# Patient Record
Sex: Female | Born: 1960 | Race: White | Hispanic: No | Marital: Single | Smoking: Never smoker
Health system: Southern US, Community
[De-identification: ages and names within clinical notes are randomized; demographics above are authoritative.]

## PROBLEM LIST (undated history)

## (undated) DIAGNOSIS — Z9889 Other specified postprocedural states: Secondary | ICD-10-CM

## (undated) DIAGNOSIS — I10 Essential (primary) hypertension: Secondary | ICD-10-CM

## (undated) DIAGNOSIS — R112 Nausea with vomiting, unspecified: Secondary | ICD-10-CM

## (undated) DIAGNOSIS — N879 Dysplasia of cervix uteri, unspecified: Secondary | ICD-10-CM

## (undated) DIAGNOSIS — Z87442 Personal history of urinary calculi: Secondary | ICD-10-CM

## (undated) DIAGNOSIS — E785 Hyperlipidemia, unspecified: Secondary | ICD-10-CM

## (undated) DIAGNOSIS — N184 Chronic kidney disease, stage 4 (severe): Secondary | ICD-10-CM

## (undated) DIAGNOSIS — F32A Depression, unspecified: Secondary | ICD-10-CM

## (undated) DIAGNOSIS — J302 Other seasonal allergic rhinitis: Secondary | ICD-10-CM

## (undated) DIAGNOSIS — F419 Anxiety disorder, unspecified: Secondary | ICD-10-CM

## (undated) DIAGNOSIS — U071 COVID-19: Secondary | ICD-10-CM

## (undated) DIAGNOSIS — J1282 Pneumonia due to coronavirus disease 2019: Secondary | ICD-10-CM

## (undated) DIAGNOSIS — A419 Sepsis, unspecified organism: Secondary | ICD-10-CM

## (undated) DIAGNOSIS — K219 Gastro-esophageal reflux disease without esophagitis: Secondary | ICD-10-CM

## (undated) DIAGNOSIS — N390 Urinary tract infection, site not specified: Secondary | ICD-10-CM

## (undated) DIAGNOSIS — N200 Calculus of kidney: Secondary | ICD-10-CM

## (undated) DIAGNOSIS — Q6410 Exstrophy of urinary bladder, unspecified: Secondary | ICD-10-CM

## (undated) DIAGNOSIS — D649 Anemia, unspecified: Secondary | ICD-10-CM

## (undated) HISTORY — PX: DIALYSIS/PERMA CATHETER INSERTION: CATH118288

## (undated) HISTORY — PX: NEPHROSTOMY TUBE PLACEMENT (ARMC HX): HXRAD1726

## (undated) HISTORY — PX: KIDNEY STONE SURGERY: SHX686

## (undated) HISTORY — DX: Other seasonal allergic rhinitis: J30.2

## (undated) HISTORY — PX: COLOSTOMY: SHX63

## (undated) HISTORY — PX: SMALL INTESTINE SURGERY: SHX150

## (undated) HISTORY — DX: Hyperlipidemia, unspecified: E78.5

## (undated) HISTORY — PX: URETEROSIGMOIDOSTOMY: SUR1412

## (undated) HISTORY — PX: ABDOMINAL HYSTERECTOMY: SHX81

## (undated) HISTORY — PX: OTHER SURGICAL HISTORY: SHX169

## (undated) HISTORY — PX: ABDOMINAL HERNIA REPAIR: SHX539

---

## 2008-02-20 ENCOUNTER — Ambulatory Visit: Payer: Self-pay | Admitting: Internal Medicine

## 2008-06-07 ENCOUNTER — Ambulatory Visit: Payer: Self-pay | Admitting: Specialist

## 2008-08-04 ENCOUNTER — Emergency Department: Payer: Self-pay | Admitting: Emergency Medicine

## 2009-11-29 ENCOUNTER — Ambulatory Visit: Payer: Self-pay | Admitting: Internal Medicine

## 2010-04-18 ENCOUNTER — Ambulatory Visit: Payer: Self-pay | Admitting: Internal Medicine

## 2011-03-09 DIAGNOSIS — Q6410 Exstrophy of urinary bladder, unspecified: Secondary | ICD-10-CM | POA: Insufficient documentation

## 2011-03-09 DIAGNOSIS — N209 Urinary calculus, unspecified: Secondary | ICD-10-CM | POA: Insufficient documentation

## 2011-03-09 DIAGNOSIS — N2589 Other disorders resulting from impaired renal tubular function: Secondary | ICD-10-CM | POA: Insufficient documentation

## 2011-07-24 ENCOUNTER — Ambulatory Visit: Payer: Self-pay | Admitting: Internal Medicine

## 2012-09-05 ENCOUNTER — Ambulatory Visit: Payer: Self-pay | Admitting: Internal Medicine

## 2013-05-26 HISTORY — PX: CERVICAL CONE BIOPSY: SUR198

## 2013-06-21 DIAGNOSIS — R1900 Intra-abdominal and pelvic swelling, mass and lump, unspecified site: Secondary | ICD-10-CM | POA: Insufficient documentation

## 2013-06-21 DIAGNOSIS — N879 Dysplasia of cervix uteri, unspecified: Secondary | ICD-10-CM | POA: Insufficient documentation

## 2013-11-03 ENCOUNTER — Ambulatory Visit: Payer: Self-pay | Admitting: Internal Medicine

## 2013-11-06 ENCOUNTER — Ambulatory Visit: Payer: Self-pay | Admitting: Internal Medicine

## 2014-04-12 DIAGNOSIS — N949 Unspecified condition associated with female genital organs and menstrual cycle: Secondary | ICD-10-CM | POA: Insufficient documentation

## 2015-07-19 HISTORY — PX: COLPOSCOPY: SHX161

## 2015-12-23 HISTORY — PX: ABDOMINAL HYSTERECTOMY: SHX81

## 2016-03-08 ENCOUNTER — Other Ambulatory Visit: Payer: Self-pay | Admitting: Internal Medicine

## 2016-03-08 DIAGNOSIS — Z1231 Encounter for screening mammogram for malignant neoplasm of breast: Secondary | ICD-10-CM

## 2016-04-02 ENCOUNTER — Ambulatory Visit: Payer: Self-pay

## 2016-04-25 ENCOUNTER — Other Ambulatory Visit: Payer: Self-pay | Admitting: Urology

## 2016-04-25 DIAGNOSIS — N3000 Acute cystitis without hematuria: Secondary | ICD-10-CM

## 2016-04-25 DIAGNOSIS — N184 Chronic kidney disease, stage 4 (severe): Secondary | ICD-10-CM

## 2016-04-25 DIAGNOSIS — R82998 Other abnormal findings in urine: Secondary | ICD-10-CM

## 2016-10-30 ENCOUNTER — Ambulatory Visit
Admission: RE | Admit: 2016-10-30 | Discharge: 2016-10-30 | Disposition: A | Discharge status: Home / Self Care | Payer: Medicare Other | Source: Ambulatory Visit | Attending: Urology | Admitting: Urology

## 2016-10-30 DIAGNOSIS — N184 Chronic kidney disease, stage 4 (severe): Secondary | ICD-10-CM

## 2016-10-30 DIAGNOSIS — N281 Cyst of kidney, acquired: Secondary | ICD-10-CM | POA: Insufficient documentation

## 2016-10-30 DIAGNOSIS — R82998 Other abnormal findings in urine: Secondary | ICD-10-CM

## 2016-10-30 DIAGNOSIS — N3000 Acute cystitis without hematuria: Secondary | ICD-10-CM

## 2016-12-26 ENCOUNTER — Ambulatory Visit
Admission: RE | Admit: 2016-12-26 | Discharge: 2016-12-26 | Disposition: A | Discharge status: Home / Self Care | Payer: Medicare Other | Source: Ambulatory Visit | Attending: Internal Medicine | Admitting: Internal Medicine

## 2016-12-26 ENCOUNTER — Encounter: Payer: Self-pay | Admitting: Radiology

## 2016-12-26 DIAGNOSIS — Z1231 Encounter for screening mammogram for malignant neoplasm of breast: Secondary | ICD-10-CM | POA: Diagnosis present

## 2017-05-30 DIAGNOSIS — K432 Incisional hernia without obstruction or gangrene: Secondary | ICD-10-CM | POA: Insufficient documentation

## 2017-06-24 ENCOUNTER — Emergency Department
Admission: EM | Admit: 2017-06-24 | Discharge: 2017-06-24 | Disposition: A | Discharge status: Home / Self Care | Payer: Medicare Other | Attending: Emergency Medicine | Admitting: Emergency Medicine

## 2017-06-24 ENCOUNTER — Encounter: Payer: Self-pay | Admitting: Emergency Medicine

## 2017-06-24 ENCOUNTER — Emergency Department: Payer: Medicare Other

## 2017-06-24 ENCOUNTER — Other Ambulatory Visit: Payer: Self-pay

## 2017-06-24 DIAGNOSIS — S29012A Strain of muscle and tendon of back wall of thorax, initial encounter: Secondary | ICD-10-CM | POA: Insufficient documentation

## 2017-06-24 DIAGNOSIS — I10 Essential (primary) hypertension: Secondary | ICD-10-CM | POA: Diagnosis not present

## 2017-06-24 DIAGNOSIS — S39012A Strain of muscle, fascia and tendon of lower back, initial encounter: Secondary | ICD-10-CM

## 2017-06-24 DIAGNOSIS — Y929 Unspecified place or not applicable: Secondary | ICD-10-CM | POA: Diagnosis not present

## 2017-06-24 DIAGNOSIS — Y999 Unspecified external cause status: Secondary | ICD-10-CM | POA: Diagnosis not present

## 2017-06-24 DIAGNOSIS — S299XXA Unspecified injury of thorax, initial encounter: Secondary | ICD-10-CM | POA: Diagnosis present

## 2017-06-24 DIAGNOSIS — Y939 Activity, unspecified: Secondary | ICD-10-CM | POA: Insufficient documentation

## 2017-06-24 HISTORY — DX: Essential (primary) hypertension: I10

## 2017-06-24 MED ORDER — METHOCARBAMOL 500 MG PO TABS: 500.0000 mg | ORAL_TABLET | Freq: Four times a day (QID) | ORAL | 0 refills | Status: DC

## 2017-06-24 MED ORDER — PREDNISONE 50 MG PO TABS: 50.0000 mg | ORAL_TABLET | Freq: Every day | ORAL | 0 refills | Status: DC

## 2017-06-24 MED ORDER — PREDNISONE 20 MG PO TABS
60.0000 mg | ORAL_TABLET | Freq: Once | ORAL | Status: AC
  Administered 2017-06-24: 60 mg via ORAL
  Filled 2017-06-24: qty 3
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
## 2017-06-24 NOTE — ED Notes (Signed)
Pt discharged to home.  Family member driving.  Discharge instructions reviewed.  Verbalized understanding.  No questions or concerns at this time.  Teach back verified.  Pt in NAD.  No items left in ED.   
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
## 2017-07-12 HISTORY — PX: ABDOMINAL HERNIA REPAIR: SHX539

## 2017-07-18 ENCOUNTER — Other Ambulatory Visit: Payer: Self-pay | Admitting: Internal Medicine

## 2017-07-18 DIAGNOSIS — N183 Chronic kidney disease, stage 3 unspecified: Secondary | ICD-10-CM

## 2017-07-18 DIAGNOSIS — Z87442 Personal history of urinary calculi: Secondary | ICD-10-CM

## 2017-08-10 DIAGNOSIS — T8149XA Infection following a procedure, other surgical site, initial encounter: Secondary | ICD-10-CM | POA: Insufficient documentation

## 2017-08-21 ENCOUNTER — Other Ambulatory Visit: Payer: Medicare Other

## 2017-08-30 DIAGNOSIS — T8579XA Infection and inflammatory reaction due to other internal prosthetic devices, implants and grafts, initial encounter: Secondary | ICD-10-CM | POA: Insufficient documentation

## 2017-09-18 HISTORY — PX: DIAGNOSTIC LAPAROSCOPY: SUR761

## 2017-11-26 ENCOUNTER — Other Ambulatory Visit: Payer: Self-pay | Admitting: Internal Medicine

## 2017-11-26 DIAGNOSIS — Z1231 Encounter for screening mammogram for malignant neoplasm of breast: Secondary | ICD-10-CM

## 2017-12-31 ENCOUNTER — Ambulatory Visit
Admission: RE | Admit: 2017-12-31 | Discharge: 2017-12-31 | Disposition: A | Discharge status: Home / Self Care | Payer: Medicare Other | Source: Ambulatory Visit | Attending: Internal Medicine | Admitting: Internal Medicine

## 2017-12-31 DIAGNOSIS — N183 Chronic kidney disease, stage 3 unspecified: Secondary | ICD-10-CM

## 2017-12-31 DIAGNOSIS — M85851 Other specified disorders of bone density and structure, right thigh: Secondary | ICD-10-CM | POA: Diagnosis not present

## 2017-12-31 DIAGNOSIS — M85831 Other specified disorders of bone density and structure, right forearm: Secondary | ICD-10-CM | POA: Diagnosis not present

## 2017-12-31 DIAGNOSIS — Z1231 Encounter for screening mammogram for malignant neoplasm of breast: Secondary | ICD-10-CM | POA: Insufficient documentation

## 2017-12-31 DIAGNOSIS — Z87442 Personal history of urinary calculi: Secondary | ICD-10-CM | POA: Insufficient documentation

## 2019-02-18 ENCOUNTER — Other Ambulatory Visit: Payer: Self-pay | Admitting: Family Medicine

## 2019-02-18 ENCOUNTER — Other Ambulatory Visit: Payer: Self-pay

## 2019-02-18 ENCOUNTER — Ambulatory Visit
Admission: RE | Admit: 2019-02-18 | Discharge: 2019-02-18 | Disposition: A | Discharge status: Home / Self Care | Payer: Medicare Other | Source: Ambulatory Visit | Attending: Family Medicine | Admitting: Family Medicine

## 2019-02-18 ENCOUNTER — Ambulatory Visit
Admission: RE | Admit: 2019-02-18 | Discharge: 2019-02-18 | Disposition: A | Discharge status: Home / Self Care | Payer: Medicare Other | Attending: Family Medicine | Admitting: Family Medicine

## 2019-02-18 DIAGNOSIS — M79672 Pain in left foot: Secondary | ICD-10-CM | POA: Insufficient documentation

## 2019-02-18 DIAGNOSIS — M25562 Pain in left knee: Secondary | ICD-10-CM

## 2019-03-11 ENCOUNTER — Other Ambulatory Visit: Payer: Self-pay | Admitting: Internal Medicine

## 2019-03-11 DIAGNOSIS — Z1231 Encounter for screening mammogram for malignant neoplasm of breast: Secondary | ICD-10-CM

## 2019-04-08 ENCOUNTER — Ambulatory Visit
Admission: RE | Admit: 2019-04-08 | Discharge: 2019-04-08 | Disposition: A | Discharge status: Home / Self Care | Payer: Medicare Other | Source: Ambulatory Visit | Attending: Internal Medicine | Admitting: Internal Medicine

## 2019-04-08 DIAGNOSIS — Z1231 Encounter for screening mammogram for malignant neoplasm of breast: Secondary | ICD-10-CM | POA: Insufficient documentation

## 2019-09-18 ENCOUNTER — Other Ambulatory Visit: Payer: Self-pay

## 2019-09-18 DIAGNOSIS — B965 Pseudomonas (aeruginosa) (mallei) (pseudomallei) as the cause of diseases classified elsewhere: Secondary | ICD-10-CM | POA: Diagnosis present

## 2019-09-18 DIAGNOSIS — J1282 Pneumonia due to coronavirus disease 2019: Secondary | ICD-10-CM | POA: Diagnosis present

## 2019-09-18 DIAGNOSIS — Z87440 Personal history of urinary (tract) infections: Secondary | ICD-10-CM

## 2019-09-18 DIAGNOSIS — I129 Hypertensive chronic kidney disease with stage 1 through stage 4 chronic kidney disease, or unspecified chronic kidney disease: Secondary | ICD-10-CM | POA: Diagnosis present

## 2019-09-18 DIAGNOSIS — N390 Urinary tract infection, site not specified: Secondary | ICD-10-CM | POA: Diagnosis present

## 2019-09-18 DIAGNOSIS — Z882 Allergy status to sulfonamides status: Secondary | ICD-10-CM

## 2019-09-18 DIAGNOSIS — Z7952 Long term (current) use of systemic steroids: Secondary | ICD-10-CM

## 2019-09-18 DIAGNOSIS — E872 Acidosis: Secondary | ICD-10-CM | POA: Diagnosis present

## 2019-09-18 DIAGNOSIS — Z79899 Other long term (current) drug therapy: Secondary | ICD-10-CM

## 2019-09-18 DIAGNOSIS — B964 Proteus (mirabilis) (morganii) as the cause of diseases classified elsewhere: Secondary | ICD-10-CM | POA: Diagnosis present

## 2019-09-18 DIAGNOSIS — Z933 Colostomy status: Secondary | ICD-10-CM

## 2019-09-18 DIAGNOSIS — N184 Chronic kidney disease, stage 4 (severe): Secondary | ICD-10-CM | POA: Diagnosis present

## 2019-09-18 DIAGNOSIS — Z87442 Personal history of urinary calculi: Secondary | ICD-10-CM

## 2019-09-18 DIAGNOSIS — U071 COVID-19: Principal | ICD-10-CM | POA: Diagnosis present

## 2019-09-18 DIAGNOSIS — Z91041 Radiographic dye allergy status: Secondary | ICD-10-CM

## 2019-09-18 LAB — CBC WITH DIFFERENTIAL/PLATELET
Abs Immature Granulocytes: 0.01 10*3/uL (ref 0.00–0.07)
Basophils Absolute: 0 10*3/uL (ref 0.0–0.1)
Basophils Relative: 1 %
Eosinophils Absolute: 0 10*3/uL (ref 0.0–0.5)
Eosinophils Relative: 1 %
HCT: 34.2 % — ABNORMAL LOW (ref 36.0–46.0)
Hemoglobin: 11 g/dL — ABNORMAL LOW (ref 12.0–15.0)
Immature Granulocytes: 0 %
Lymphocytes Relative: 26 %
Lymphs Abs: 1.1 10*3/uL (ref 0.7–4.0)
MCH: 30.5 pg (ref 26.0–34.0)
MCHC: 32.2 g/dL (ref 30.0–36.0)
MCV: 94.7 fL (ref 80.0–100.0)
Monocytes Absolute: 0.2 10*3/uL (ref 0.1–1.0)
Monocytes Relative: 4 %
Neutro Abs: 2.8 10*3/uL (ref 1.7–7.7)
Neutrophils Relative %: 68 %
Platelets: 184 10*3/uL (ref 150–400)
RBC: 3.61 MIL/uL — ABNORMAL LOW (ref 3.87–5.11)
RDW: 14.4 % (ref 11.5–15.5)
WBC: 4.2 10*3/uL (ref 4.0–10.5)
nRBC: 0 % (ref 0.0–0.2)

## 2019-09-18 LAB — URINALYSIS, COMPLETE (UACMP) WITH MICROSCOPIC
Bacteria, UA: NONE SEEN
Bilirubin Urine: NEGATIVE
Glucose, UA: NEGATIVE mg/dL
Hgb urine dipstick: NEGATIVE
Ketones, ur: NEGATIVE mg/dL
Nitrite: NEGATIVE
Protein, ur: 100 mg/dL — AB
Specific Gravity, Urine: 1.011 (ref 1.005–1.030)
pH: 7 (ref 5.0–8.0)

## 2019-09-18 LAB — COMPREHENSIVE METABOLIC PANEL WITH GFR
ALT: 11 U/L (ref 0–44)
AST: 19 U/L (ref 15–41)
Albumin: 3.8 g/dL (ref 3.5–5.0)
Alkaline Phosphatase: 104 U/L (ref 38–126)
Anion gap: 8 (ref 5–15)
BUN: 25 mg/dL — ABNORMAL HIGH (ref 6–20)
CO2: 16 mmol/L — ABNORMAL LOW (ref 22–32)
Calcium: 8.5 mg/dL — ABNORMAL LOW (ref 8.9–10.3)
Chloride: 112 mmol/L — ABNORMAL HIGH (ref 98–111)
Creatinine, Ser: 2.35 mg/dL — ABNORMAL HIGH (ref 0.44–1.00)
GFR calc Af Amer: 25 mL/min — ABNORMAL LOW
GFR calc non Af Amer: 22 mL/min — ABNORMAL LOW
Glucose, Bld: 128 mg/dL — ABNORMAL HIGH (ref 70–99)
Potassium: 3.7 mmol/L (ref 3.5–5.1)
Sodium: 136 mmol/L (ref 135–145)
Total Bilirubin: 0.6 mg/dL (ref 0.3–1.2)
Total Protein: 7.3 g/dL (ref 6.5–8.1)

## 2019-09-18 LAB — LACTIC ACID, PLASMA: Lactic Acid, Venous: 0.7 mmol/L (ref 0.5–1.9)

## 2019-09-18 NOTE — ED Triage Notes (Signed)
Pt has UTI and MD was going to call in a script. Pharmacist then called back and said that she would need IV antibiotics and to come in the ER. Pt is covid positive on 09/17/19.

## 2019-09-18 NOTE — ED Notes (Signed)
Called x1

## 2019-09-18 NOTE — ED Notes (Signed)
Called x2

## 2019-09-19 ENCOUNTER — Encounter: Payer: Self-pay | Admitting: Internal Medicine

## 2019-09-19 ENCOUNTER — Emergency Department: Payer: Medicare Other

## 2019-09-19 ENCOUNTER — Inpatient Hospital Stay
Admission: EM | Admit: 2019-09-19 | Discharge: 2019-09-22 | DRG: 177 | Disposition: A | Discharge status: Home / Self Care | Payer: Medicare Other | Attending: Internal Medicine | Admitting: Internal Medicine

## 2019-09-19 DIAGNOSIS — E872 Acidosis: Secondary | ICD-10-CM | POA: Diagnosis present

## 2019-09-19 DIAGNOSIS — Z933 Colostomy status: Secondary | ICD-10-CM | POA: Diagnosis not present

## 2019-09-19 DIAGNOSIS — N390 Urinary tract infection, site not specified: Secondary | ICD-10-CM

## 2019-09-19 DIAGNOSIS — Z79899 Other long term (current) drug therapy: Secondary | ICD-10-CM | POA: Diagnosis not present

## 2019-09-19 DIAGNOSIS — I10 Essential (primary) hypertension: Secondary | ICD-10-CM | POA: Diagnosis not present

## 2019-09-19 DIAGNOSIS — B965 Pseudomonas (aeruginosa) (mallei) (pseudomallei) as the cause of diseases classified elsewhere: Secondary | ICD-10-CM | POA: Diagnosis present

## 2019-09-19 DIAGNOSIS — J1282 Pneumonia due to coronavirus disease 2019: Secondary | ICD-10-CM

## 2019-09-19 DIAGNOSIS — B964 Proteus (mirabilis) (morganii) as the cause of diseases classified elsewhere: Secondary | ICD-10-CM | POA: Diagnosis present

## 2019-09-19 DIAGNOSIS — Z91041 Radiographic dye allergy status: Secondary | ICD-10-CM | POA: Diagnosis not present

## 2019-09-19 DIAGNOSIS — U071 COVID-19: Secondary | ICD-10-CM | POA: Diagnosis not present

## 2019-09-19 DIAGNOSIS — Z7952 Long term (current) use of systemic steroids: Secondary | ICD-10-CM | POA: Diagnosis not present

## 2019-09-19 DIAGNOSIS — I129 Hypertensive chronic kidney disease with stage 1 through stage 4 chronic kidney disease, or unspecified chronic kidney disease: Secondary | ICD-10-CM | POA: Diagnosis present

## 2019-09-19 DIAGNOSIS — N184 Chronic kidney disease, stage 4 (severe): Secondary | ICD-10-CM | POA: Diagnosis not present

## 2019-09-19 DIAGNOSIS — Z87440 Personal history of urinary (tract) infections: Secondary | ICD-10-CM | POA: Diagnosis not present

## 2019-09-19 DIAGNOSIS — Z882 Allergy status to sulfonamides status: Secondary | ICD-10-CM | POA: Diagnosis not present

## 2019-09-19 DIAGNOSIS — Z87442 Personal history of urinary calculi: Secondary | ICD-10-CM | POA: Diagnosis not present

## 2019-09-19 HISTORY — DX: Chronic kidney disease, stage 4 (severe): N18.4

## 2019-09-19 HISTORY — DX: Calculus of kidney: N20.0

## 2019-09-19 HISTORY — DX: Urinary tract infection, site not specified: N39.0

## 2019-09-19 LAB — POC SARS CORONAVIRUS 2 AG: SARS Coronavirus 2 Ag: NEGATIVE

## 2019-09-19 LAB — SARS CORONAVIRUS 2 BY RT PCR (HOSPITAL ORDER, PERFORMED IN ~~LOC~~ HOSPITAL LAB): SARS Coronavirus 2: POSITIVE — AB

## 2019-09-19 MED ORDER — SODIUM CHLORIDE 0.9 % IV SOLN
100.0000 mg | Freq: Every day | INTRAVENOUS | Status: DC
  Administered 2019-09-20 – 2019-09-22 (×3): 100 mg via INTRAVENOUS
  Filled 2019-09-19 (×4): qty 20

## 2019-09-19 MED ORDER — ACETAMINOPHEN 325 MG PO TABS: 650.0000 mg | ORAL_TABLET | Freq: Four times a day (QID) | ORAL | Status: DC | PRN

## 2019-09-19 MED ORDER — HYDRALAZINE HCL 20 MG/ML IJ SOLN: 5.0000 mg | INTRAMUSCULAR | Status: DC | PRN

## 2019-09-19 MED ORDER — IPRATROPIUM BROMIDE HFA 17 MCG/ACT IN AERS
2.0000 | INHALATION_SPRAY | RESPIRATORY_TRACT | Status: DC
  Administered 2019-09-19 – 2019-09-21 (×4): 2 via RESPIRATORY_TRACT
  Filled 2019-09-19 (×2): qty 12.9

## 2019-09-19 MED ORDER — DM-GUAIFENESIN ER 30-600 MG PO TB12: 1.0000 | ORAL_TABLET | Freq: Two times a day (BID) | ORAL | Status: DC | PRN

## 2019-09-19 MED ORDER — ENOXAPARIN SODIUM 40 MG/0.4ML ~~LOC~~ SOLN: 40.0000 mg | SUBCUTANEOUS | Status: DC

## 2019-09-19 MED ORDER — ASCORBIC ACID 500 MG PO TABS
500.0000 mg | ORAL_TABLET | Freq: Every day | ORAL | Status: DC
  Administered 2019-09-19 – 2019-09-22 (×4): 500 mg via ORAL
  Filled 2019-09-19 (×4): qty 1

## 2019-09-19 MED ORDER — SODIUM BICARBONATE 650 MG PO TABS
1300.0000 mg | ORAL_TABLET | Freq: Three times a day (TID) | ORAL | Status: DC
  Administered 2019-09-19 – 2019-09-22 (×8): 1300 mg via ORAL
  Filled 2019-09-19 (×10): qty 2

## 2019-09-19 MED ORDER — ONDANSETRON HCL 4 MG/2ML IJ SOLN: 4.0000 mg | Freq: Three times a day (TID) | INTRAMUSCULAR | Status: DC | PRN

## 2019-09-19 MED ORDER — METHYLPREDNISOLONE SODIUM SUCC 40 MG IJ SOLR
30.0000 mg | Freq: Two times a day (BID) | INTRAMUSCULAR | Status: DC
  Administered 2019-09-19 – 2019-09-22 (×7): 30 mg via INTRAVENOUS
  Filled 2019-09-19 (×7): qty 1

## 2019-09-19 MED ORDER — DOCUSATE SODIUM 100 MG PO CAPS
100.0000 mg | ORAL_CAPSULE | ORAL | Status: DC | PRN
  Administered 2019-09-19 – 2019-09-20 (×2): 100 mg via ORAL
  Filled 2019-09-19 (×2): qty 1

## 2019-09-19 MED ORDER — ZINC SULFATE 220 (50 ZN) MG PO CAPS
220.0000 mg | ORAL_CAPSULE | Freq: Every day | ORAL | Status: DC
  Administered 2019-09-19 – 2019-09-22 (×4): 220 mg via ORAL
  Filled 2019-09-19 (×4): qty 1

## 2019-09-19 MED ORDER — SODIUM CHLORIDE 0.9 % IV SOLN
200.0000 mg | Freq: Once | INTRAVENOUS | Status: AC
  Administered 2019-09-19: 200 mg via INTRAVENOUS
  Filled 2019-09-19: qty 200

## 2019-09-19 MED ORDER — PIPERACILLIN-TAZOBACTAM 3.375 G IVPB
3.3750 g | Freq: Three times a day (TID) | INTRAVENOUS | Status: DC
  Administered 2019-09-19 – 2019-09-21 (×5): 3.375 g via INTRAVENOUS
  Filled 2019-09-19 (×8): qty 50

## 2019-09-19 MED ORDER — ENOXAPARIN SODIUM 30 MG/0.3ML ~~LOC~~ SOLN
30.0000 mg | SUBCUTANEOUS | Status: DC
  Administered 2019-09-19 – 2019-09-21 (×3): 30 mg via SUBCUTANEOUS
  Filled 2019-09-19 (×3): qty 0.3

## 2019-09-19 MED ORDER — CYCLOBENZAPRINE HCL 10 MG PO TABS: 10.0000 mg | ORAL_TABLET | Freq: Every evening | ORAL | Status: DC | PRN

## 2019-09-19 MED ORDER — PIPERACILLIN-TAZOBACTAM 3.375 G IVPB 30 MIN
3.3750 g | Freq: Once | INTRAVENOUS | Status: AC
Start: 2019-09-19 — End: 2019-09-19
  Administered 2019-09-19: 3.375 g via INTRAVENOUS
  Filled 2019-09-19: qty 50

## 2019-09-19 MED ORDER — LORATADINE 10 MG PO TABS
10.0000 mg | ORAL_TABLET | Freq: Every day | ORAL | Status: DC
  Administered 2019-09-20 – 2019-09-22 (×2): 10 mg via ORAL
  Filled 2019-09-19 (×3): qty 1

## 2019-09-19 MED ORDER — ALBUTEROL SULFATE HFA 108 (90 BASE) MCG/ACT IN AERS
2.0000 | INHALATION_SPRAY | RESPIRATORY_TRACT | Status: DC | PRN
  Filled 2019-09-19: qty 6.7

## 2019-09-19 NOTE — ED Notes (Signed)
This RN attempted to call report. This RN informed 1C is not taking anymore pt's due to staffing. Charge RN made aware.

## 2019-09-19 NOTE — ED Notes (Signed)
Pt given meal tray and water 

## 2019-09-19 NOTE — ED Provider Notes (Addendum)
[facility] Emergency Department Provider Note   ____________________________________________   First MD Initiated Contact with Patient 09/19/19 [id number]     (approximate)  I have reviewed the triage vital signs and the nursing notes.   HISTORY  Chief Complaint Urinary Tract Infection    HPI Jessica Patterson is a 59 y.o. female referred to the ED by her doctors at [facility] for IV antibiotics for Proteus positive UTI resistant to oral antibiotics.  Patient has CKD stage III followed by nephrology, history of kidney stones followed by urology, recurrent UTIs.  She was recently seen in the ED there and diagnosed with UTI which was cultured and found to be Proteus positive resistant to oral antibiotics.  She had several telephone conversations with her physicians as well as the pharmacy at [facility] and ultimately directed to her closest ED for evaluation and admission for IV antibiotics.  Of note, patient tested positive for COVID-19 at [facility] 2 days ago.  She has been experiencing fevers and cough and shortness of breath.  Denies chest pain, abdominal pain, nausea, vomiting or diarrhea.      Past Medical History:  Diagnosis Date  . Hypertension    Infected prosthetic mesh of abdominal wall 08/30/2017  Overview:   Formatting of this note might be different from the original. Added automatically from request for surgery [phone]   Abdominal wall abscess at site of surgical wound 08/10/2017  S/P repair of ventral hernia 07/12/2017  Incisional hernia 05/30/2017  Overview:   Formatting of this note might be different from the original. Added automatically from request for surgery [phone]   Parastomal hernia with obstruction, without gangrene 05/30/2017  Overview:   Formatting of this note might be different from the original. 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  Calcium urolithiasis 03/09/2011  Exstrophy of urinary bladder 03/09/2011  UTI (urinary tract infection)   Ureteral stone   PONV (postoperative nausea and vomiting)   Renal mass   Metabolic acidosis   Kidney stone   Hypertension   Hemorrhoids   GERD (gastroesophageal reflux disease)   CKD (chronic kidney disease) stage 4, GFR 15-29 ml/min   Bladder extrophy   Anemia in stage 4 chronic kidney disease   Allergy   Resolved Problems  Problem Noted Date Resolved Date  Normal anion gap metabolic acidosis 41/28/7867 02/15/2018  Urinary tract infection without hematuria 10/11/2015 07/04/2017  PONV (postoperative nausea and vomiting) 10/22/2014 06/25/2016  Abdominal pain of unknown etiology 09/14/2014 07/04/2017  Adnexal cyst 04/12/2014 07/04/2017  GERD (gastroesophageal reflux disease) 05/14/2013 06/25/2016  OA (osteoarthritis) 05/14/2013 06/25/2016  Abnormal Pap smear of cervix 04/24/2013 06/21/2013  Chronic urinary tract infection 03/09/2011 07/04/2017  Parastomal hernia  07/04/2017  Antibiotic-associated diarrhea       Patient Active Problem List   Diagnosis Date Noted  . Pneumonia due to COVID-19 virus 09/19/2019  . UTI (urinary tract infection) 09/19/2019    Past Surgical History:  Procedure Laterality Date  . ABDOMINAL HYSTERECTOMY      Prior to Admission medications   Medication Sig Start Date End Date Taking? Authorizing Provider  methocarbamol (ROBAXIN) 500 MG tablet Take 1 tablet (500 mg total) by mouth 4 (four) times daily. 06/24/17  Cuthriell, Charline Bills, PA-C  predniSONE (DELTASONE) 50 MG tablet Take 1 tablet (50 mg total) by mouth daily with breakfast. 06/24/17   Cuthriell, Charline Bills, PA-C    Allergies Ivp  dye [iodinated diagnostic agents] and Sulfa antibiotics  Family History  Problem Relation Age of Onset  . Breast cancer Neg Hx     Social History Social History   Tobacco Use  . Smoking status: Never Smoker  . Smokeless tobacco: Never Used  Substance Use Topics  . Alcohol use: Not on file  . Drug use: Not on file    Review of Systems  Constitutional: Positive for fever/chills Eyes: No visual changes. ENT: No sore throat. Cardiovascular: Denies chest pain. Respiratory: Positive for cough and shortness of breath. Gastrointestinal: No abdominal pain.  No nausea, no vomiting.  No diarrhea.  No constipation. Genitourinary: Negative for dysuria. Musculoskeletal: Negative for back pain. Skin: Negative for rash. Neurological: Negative for headaches, focal weakness or numbness.   ____________________________________________   PHYSICAL EXAM:  VITAL SIGNS: ED Triage Vitals  Enc Vitals Group     BP 09/18/19 1838 118/87     Pulse Rate 09/18/19 1836 (!) 101     Resp 09/18/19 1836 18     Temp 09/18/19 1836 100.1 F (37.8 C)     Temp Source 09/18/19 1836 Oral     SpO2 09/18/19 1836 95 %     Weight 09/18/19 1836 144 lb (65.3 kg)     Height 09/18/19 1836 5\' 1"  (1.549 m)     Head Circumference --      Peak Flow --      Pain Score 09/18/19 1852 5     Pain Loc --      Pain Edu? --      Excl. in [place]? --     Constitutional: Alert and oriented. Well appearing and in no acute distress. Eyes: Conjunctivae are normal. PERRL. EOMI. Head: Atraumatic. Nose: No congestion/rhinnorhea. Mouth/Throat: Mucous membranes are mildly dry.   Neck: No stridor.   Cardiovascular: Normal rate, regular rhythm. Grossly normal heart sounds. Good peripheral circulation. Respiratory: Normal respiratory effort.  No retractions. Lungs CTAB. Gastrointestinal: Soft and nontender. No distention. No abdominal bruits. No CVA tenderness. Musculoskeletal: No lower extremity tenderness nor edema.  No joint  effusions. Neurologic:  Normal speech and language. No gross focal neurologic deficits are appreciated. No gait instability. Skin:  Skin is warm, dry and intact. No rash noted. Psychiatric: Mood and affect are normal. Speech and behavior are normal.  ____________________________________________   LABS (all labs ordered are listed, but only abnormal results are displayed)  Labs Reviewed  COMPREHENSIVE METABOLIC PANEL - Abnormal; Notable for the following components:      Result Value   Chloride 112 (*)    CO2 16 (*)    Glucose, Bld 128 (*)    BUN 25 (*)    Creatinine, Ser 2.35 (*)    Calcium 8.5 (*)    GFR calc non Af Amer 22 (*)    GFR calc Af Amer 25 (*)    All other components within normal limits  CBC WITH DIFFERENTIAL/PLATELET - Abnormal; Notable for the following components:   RBC 3.61 (*)    Hemoglobin 11.0 (*)    HCT 34.2 (*)    All other components within normal limits  URINALYSIS, COMPLETE (UACMP) WITH MICROSCOPIC - Abnormal; Notable for the following components:   Color, Urine YELLOW (*)    APPearance HAZY (*)    Protein, ur 100 (*)  Leukocytes,Ua SMALL (*)    All other components within normal limits  URINE CULTURE  CULTURE, BLOOD (ROUTINE X 2)  CULTURE, BLOOD (ROUTINE X 2)  SARS CORONAVIRUS 2 BY RT PCR (HOSPITAL ORDER, [place] [facility])  LACTIC ACID, PLASMA  LACTIC ACID, PLASMA  POC URINE PREG, ED  POC SARS CORONAVIRUS 2 AG -  ED  POC SARS CORONAVIRUS 2 AG   ____________________________________________  EKG  None ____________________________________________  RADIOLOGY  ED MD interpretation: Patchy bibasilar opacities consistent with COVID-19 pneumonia  Official radiology report(s): DG Chest Port 1 View  Result Date: 09/19/2019 CLINICAL DATA:  Fever and shortness of breath, COVID-19 positivity EXAM: PORTABLE CHEST 1 VIEW COMPARISON:  None. FINDINGS: Cardiac shadow is at the upper limits of normal in size. The lungs are well  aerated bilaterally. Patchy opacities are seen in the bases consistent with the given clinical history. No sizable effusion is noted. No bony abnormality is noted. IMPRESSION: Mild patchy opacities in the bases consistent with the given clinical history. Electronically Signed   By: Inez Catalina M.D.   On: 09/19/2019 05:58    ____________________________________________   PROCEDURES  Procedure(s) performed (including Critical Care):  Procedures   ____________________________________________   INITIAL IMPRESSION / ASSESSMENT AND PLAN / ED COURSE  As part of my medical decision making, I reviewed the following data within the [place] notes reviewed and incorporated, Labs reviewed, Old chart reviewed, Radiograph reviewed, Discussed with admitting physician and Notes from prior ED visits     CLAUDEAN LEAVELLE was evaluated in Emergency Department on 09/19/2019 for the symptoms described in the history of present illness. She was evaluated in the context of the global COVID-19 pandemic, which necessitated consideration that the patient might be at risk for infection with the SARS-CoV-2 virus that causes COVID-19. Institutional protocols and algorithms that pertain to the evaluation of patients at risk for COVID-19 are in a state of rapid change based on information released by regulatory bodies including the CDC and federal and state organizations. These policies and algorithms were followed during the patient's care in the ED.    59 year old female with CKD 3 sent for IV antibiotics for Proteus positive UTI.  Laboratory results demonstrate acute on chronic AKI, leukocyte positive UTI.  I have extensively reviewed patient's records from [facility] pharmacy's help in [place] is appropriate to initiate for Proteus positive UTI.  Of note, I did also review patient's CT scan from 8/11 which already showed signs of Covid pneumonia.  Will  obtain chest x-ray, initiate IV Decadron, IV Remdesivir upon confirmatory Covid test..  Will discuss with hospitalist services for admission.   Clinical Course as of Sep 19 655  Sat Sep 19, 2019  7062 Patient has her positive [facility]  Because I am unable to upload the results into epic, obtained rapid antigen test which was NEGATIVE.  Will obtain PCR test.   [JS]    Clinical Course User Index [JS] Paulette Blanch, MD     ____________________________________________   FINAL CLINICAL IMPRESSION(S) / ED DIAGNOSES  Final diagnoses:  Urinary tract infection due to Proteus  COVID-19     ED Discharge Orders    None       Note:  This document was prepared using Dragon voice recognition software and may include unintentional dictation errors.   Paulette Blanch, MD 09/19/19 [place], [place],  MD 09/19/19 3559

## 2019-09-19 NOTE — ED Notes (Signed)
Pt given meal tray.

## 2019-09-19 NOTE — Consult Note (Signed)
Pharmacy Antibiotic Note  Jessica Patterson is a 59 y.o. female admitted on 09/19/2019 with proteus UTI resistant to po antibiotics.    Pharmacy has been consulted for Zosyn dosing.  Plan: Zosyn 3.375g IV q8h (4 hour infusion).  Height: 5\' 1"  (154.9 cm) Weight: 65.3 kg (144 lb) IBW/kg (Calculated) : 47.8  Temp (24hrs), Avg:99.6 F (37.6 C), Min:99.2 F (37.3 C), Max:100.1 F (37.8 C)  Recent Labs  Lab 09/18/19 1854 09/18/19 1925  WBC 4.2  --   CREATININE 2.35*  --   LATICACIDVEN  --  0.7    Estimated Creatinine Clearance: 22.3 mL/min (A) (by C-G formula based on SCr of 2.35 mg/dL (H)).    Allergies  Allergen Reactions   Ivp Dye [Iodinated Diagnostic Agents]    Sulfa Antibiotics     Antimicrobials this admission: Zosyn 8/14 >> remdesivir 8/14 >>   Dose adjustments this admission: none  Microbiology results: From [place] 8/11 MDR Proteus > 100k (Zosyn sensitive)  BCx 8/14 >> pending UCx 8/13 >> pending  8/14 COVID +  Thank you for allowing pharmacy to be a part of this patients care.  Lu Duffel, PharmD, BCPS Clinical Pharmacist 09/19/2019 8:05 AM

## 2019-09-19 NOTE — ED Notes (Signed)
Patient updated on wait time. Patient verbalizes understanding.  

## 2019-09-19 NOTE — ED Notes (Signed)
Attempted to call report, room not clean per Network engineer.

## 2019-09-19 NOTE — ED Notes (Signed)
Date and time results received: 09/19/19 7:25 AM  (use smartphrase ".now" to insert current time)  Test: COVID PCR Critical Value: Positive   Name of Provider Notified: Dr. Damita Dunnings  Orders Received? Or Actions Taken?: No new orders at this time

## 2019-09-19 NOTE — Consult Note (Signed)
Remdesivir - Pharmacy Brief Note    A/P:  Remdesivir 200 mg IVPB once followed by 100 mg IVPB daily x 4 days.   Lu Duffel, PharmD, BCPS Clinical Pharmacist 09/19/2019 8:03 AM

## 2019-09-19 NOTE — ED Notes (Signed)
This RN called lab to add on urine culture to previous urine sample.

## 2019-09-19 NOTE — Progress Notes (Signed)
Anticoagulation monitoring(Lovenox):  59yo  female ordered Lovenox 40 mg Q24h for DVT prevention  Filed Weights   09/18/19 1836  Weight: 65.3 kg (144 lb)   BMI 27   Lab Results  Component Value Date   CREATININE 2.35 (H) 09/18/2019   Estimated Creatinine Clearance: 22.3 mL/min (A) (by C-G formula based on SCr of 2.35 mg/dL (H)). Hemoglobin & Hematocrit     Component Value Date/Time   HGB 11.0 (L) 09/18/2019 1854   HCT 34.2 (L) 09/18/2019 1854     Per Protocol for Patient with estCrcl < 30 ml/min and BMI < 40, will transition to Lovenox 30 mg Q24h.     Paulina Fusi, PharmD, BCPS 09/19/2019 10:14 PM

## 2019-09-19 NOTE — H&P (Signed)
History and Physical    Jessica Patterson KPT:[id number] DOB: 08/22/1960 DOA: 09/19/2019  Referring MD/NP/PA:   PCP: Ellamae Sia, MD   Patient coming from:  The patient is coming from home.  At baseline, pt is independent for most of ADL.        Chief Complaint: SOB and burning on urination  HPI: Jessica Patterson is a 59 y.o. female with medical history significant of hypertension, CKD stage IV, kidney stone, recurrent UTI, s/p of colostomy, who presents with shortness breath, burning on urination.  Patient states that she has been sick for several days, including cough, shortness of breath, generalized weakness.  She was tested positive for Covid 19 on 09/17/2019.  Denies chest pain.  Currently no fever or chills.  No nausea, vomiting, diarrhea or abdominal pain. Patient also reports burning on urination.  She was seen in [facility] and was diagnosed with recent UTI.  She was called back due to positive urine culture for Proteus, which is resistant to oral antibiotics.  ED Course: pt was found to have positive Covid PCR, WBC 4.2, urinalysis (hazy appearance, small amount of leukocyte, no bacteria, WBC 21-50), slightly worsening renal function, temperature 99.2, blood pressure 120/76, tachycardia, RR 17, oxygen saturation 96% on room air.  Chest x-ray showed patchy infiltration in bilateral base.  Patient had a CT abdomen 8/11 which already showed patchy infiltration in the lower zones of bilateral lung.  Patient is admitted to [facility] bed as inpatient.  CT-abd/pelvis on 09/16/19 showed: 1. As compared to prior CT comparison, there has been interval development of patchy groundglass opacities throughout the visualized lower lung zones. Findings are concerning for atypical infection including viral causes.  2. Similar appearance of the kidneys with substantial bilateral renal cortical scarring and a 10 mm calculus in the left upper pole kidney. No ureteric obstruction is otherwise evident.  3.  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  4. Interval removal of left ventral abdominal wall mesh. Multiple small bowel loops are noted within a infraumbilical midline ventral abdominal wall defect, without definite signs of bowel obstruction. Advise correlation with reducibility.  5. Additional unchanged findings as noted above.   Review of Systems:   General: no fevers, chills, no body weight gain, has poor appetite, has fatigue HEENT: no blurry vision, hearing changes or sore throat Respiratory: has dyspnea, coughing, no wheezing CV: no chest pain, no palpitations GI: no nausea, vomiting, abdominal pain, diarrhea, constipation GU: no dysuria, has burning on urination, no increased urinary frequency, hematuria  Ext: no leg edema Neuro: no unilateral weakness, numbness, or tingling, no vision change or hearing loss Skin: no rash, no skin tear. MSK: No muscle spasm, no deformity, no limitation of range of movement in spin Heme: No easy bruising.  Travel history: No recent long distant travel.  Allergy:  Allergies  Allergen Reactions  . Ivp Dye [Iodinated Diagnostic Agents]   . Sulfa Antibiotics     Past Medical History:  Diagnosis Date  . CKD (chronic kidney disease), stage IV ([place])   . Hypertension   . Kidney stone   . Recurrent UTI     Past Surgical History:  Procedure Laterality Date  . ABDOMINAL HYSTERECTOMY      Social History:  reports that she has never smoked. She has never used smokeless tobacco. No history on file for alcohol use and drug use.  Family History:  Family History  Problem Relation Age of Onset  .  Lung cancer Mother   . Stroke Father   . Breast cancer Neg Hx      Prior to Admission medications   Medication Sig Start Date End Date Taking? Authorizing Provider  methocarbamol (ROBAXIN) 500 MG tablet Take 1 tablet (500 mg total) by mouth 4 (four) times daily. 06/24/17   Cuthriell, Charline Bills, PA-C   predniSONE (DELTASONE) 50 MG tablet Take 1 tablet (50 mg total) by mouth daily with breakfast. 06/24/17   Darletta Moll, PA-C    Physical Exam: Vitals:   09/19/19 0856 09/19/19 0900 09/19/19 0930 09/19/19 1100  BP: 116/77 122/79 123/84 114/77  Pulse: 78 76 73 80  Resp: 16 16 12 17   Temp:      TempSrc:      SpO2: 97% 94% 97% 96%  Weight:      Height:       General: Not in acute distress HEENT:       Eyes: PERRL, EOMI, no scleral icterus.       ENT: No discharge from the ears and nose, no pharynx injection, no tonsillar enlargement.        Neck: No JVD, no bruit, no mass felt. Heme: No neck lymph node enlargement. Cardiac: S1/S2, RRR, No murmurs, No gallops or rubs. Respiratory: No rales, wheezing, rhonchi or rubs. GI: Soft, nondistended, nontender, no rebound pain, no organomegaly, BS present. GU: No hematuria Ext: No pitting leg edema bilaterally. 2+DP/PT pulse bilaterally. Musculoskeletal: No joint deformities, No joint redness or warmth, no limitation of ROM in spin. Skin: No rashes.  Neuro: Alert, oriented X3, cranial nerves II-XII grossly intact, moves all extremities normally.  Psych: Patient is not psychotic, no suicidal or hemocidal ideation.  Labs on Admission: I have personally reviewed following labs and imaging studies  CBC: Recent Labs  Lab 09/18/19 1854  WBC 4.2  NEUTROABS 2.8  HGB 11.0*  HCT 34.2*  MCV 94.7  PLT 283   Basic Metabolic Panel: Recent Labs  Lab 09/18/19 1854  NA 136  K 3.7  CL 112*  CO2 16*  GLUCOSE 128*  BUN 25*  CREATININE 2.35*  CALCIUM 8.5*   GFR: Estimated Creatinine Clearance: 22.3 mL/min (A) (by C-G formula based on SCr of 2.35 mg/dL (H)). Liver Function Tests: Recent Labs  Lab 09/18/19 1854  AST 19  ALT 11  ALKPHOS 104  BILITOT 0.6  PROT 7.3  ALBUMIN 3.8   No results for input(s): LIPASE, AMYLASE in the last 168 hours. No results for input(s): AMMONIA in the last 168 hours. Coagulation Profile: No  results for input(s): INR, PROTIME in the last 168 hours. Cardiac Enzymes: No results for input(s): CKTOTAL, CKMB, CKMBINDEX, TROPONINI in the last 168 hours. BNP (last 3 results) No results for input(s): PROBNP in the last 8760 hours. HbA1C: No results for input(s): HGBA1C in the last 72 hours. CBG: No results for input(s): GLUCAP in the last 168 hours. Lipid Profile: No results for input(s): CHOL, HDL, LDLCALC, TRIG, CHOLHDL, LDLDIRECT in the last 72 hours. Thyroid Function Tests: No results for input(s): TSH, T4TOTAL, FREET4, T3FREE, THYROIDAB in the last 72 hours. Anemia Panel: No results for input(s): VITAMINB12, FOLATE, FERRITIN, TIBC, IRON, RETICCTPCT in the last 72 hours. Urine analysis:    Component Value Date/Time   COLORURINE YELLOW (A) 09/18/2019 1854   APPEARANCEUR HAZY (A) 09/18/2019 1854   LABSPEC 1.011 09/18/2019 1854   PHURINE 7.0 09/18/2019 [place] 09/18/2019 1854   HGBUR NEGATIVE 09/18/2019 1854   BILIRUBINUR  NEGATIVE 09/18/2019 [place] 09/18/2019 1854   PROTEINUR 100 (A) 09/18/2019 1854   NITRITE NEGATIVE 09/18/2019 1854   LEUKOCYTESUR SMALL (A) 09/18/2019 1854   Sepsis Labs: @LABRCNTIP (procalcitonin:4,lacticidven:4) ) Recent Results (from the past 240 hour(s))  Culture, blood (routine x 2)     Status: None (Preliminary result)   Collection Time: 09/19/19  5:14 AM   Specimen: BLOOD  Result Value Ref Range Status   Specimen Description BLOOD RIGHT ANTECUBITAL  Final   Special Requests   Final    BOTTLES DRAWN AEROBIC AND ANAEROBIC Blood Culture adequate volume   Culture   Final    NO GROWTH < 12 HOURS Performed at [facility], [address], [place], [place] [address]    Report Status PENDING  Incomplete  Culture, blood (routine x 2)     Status: None (Preliminary result)   Collection Time: 09/19/19  5:14 AM   Specimen: BLOOD  Result Value Ref Range Status   Specimen Description BLOOD LEFT ANTECUBITAL   Final   Special Requests   Final    BOTTLES DRAWN AEROBIC AND ANAEROBIC Blood Culture results may not be optimal due to an excessive volume of blood received in culture bottles   Culture   Final    NO GROWTH < 12 HOURS Performed at [facility], [address] [place], [place] [address]    Report Status PENDING  Incomplete  SARS Coronavirus 2 by RT PCR (hospital order, performed in [place] hospital lab) Nasopharyngeal Nasopharyngeal Swab     Status: Abnormal   Collection Time: 09/19/19  5:15 AM   Specimen: Nasopharyngeal Swab  Result Value Ref Range Status   SARS Coronavirus 2 POSITIVE (A) NEGATIVE Final    Comment: RESULT CALLED TO, READ BACK BY AND VERIFIED WITH: Georgann Housekeeper AT 2353 ON 09/19/2019 BY Epifanio Lesches S Performed at [facility], [address] [place], [place] [address]      Radiological Exams on Admission: DG Chest Port 1 View  Result Date: 09/19/2019 CLINICAL DATA:  Fever and shortness of breath, COVID-19 positivity EXAM: PORTABLE CHEST 1 VIEW COMPARISON:  None. FINDINGS: Cardiac shadow is at the upper limits of normal in size. The lungs are well aerated bilaterally. Patchy opacities are seen in the bases consistent with the given clinical history. No sizable effusion is noted. No bony abnormality is noted. IMPRESSION: Mild patchy opacities in the bases consistent with the given clinical history. Electronically Signed   By: Inez Catalina M.D.   On: 09/19/2019 05:58     EKG:  Not done in ED, will get one.   Assessment/Plan Principal Problem:   Pneumonia due to COVID-19 virus Active Problems:   Recurrent UTI   Hypertension   CKD (chronic kidney disease), stage IV ([facility])   Pneumonia due to COVID-19 virus: Patient does not have oxygen desaturation, but has positive infiltration on chest x-ray.  -will admit to med-surg bed as inpt -Remdesivir per pharm -Solumedrol 30 mg bid -vitamin C, zinc.  -Bronchodilators -PRN Mucinex for cough -f/u  Blood culture -D-dimer, BNP,Trop, LFT, CRP, LDH, Procalcitonin, Ferritin, fibinogen, TG, Hep B SAg, HIV ab -Daily CRP, Ferritin, D-dimer, -if pt develops oxygen desaturation, will ask the patient to maintain an awake prone position for 16+ hours a day, if possible, with a minimum of 2-3 hours at a time -Will attempt to maintain euvolemia to a net negative fluid status -IF patient deteriorates, will consult PCCM and ID  Recurrent UTI due to Proteus  infection -Zosyn -Follow-up blood culture and urine culture  Hypertension -IV hydralazine as needed -Hold lisinopril since renal function is slightly worsening  CKD (chronic kidney disease), stage IV ([place]): Slightly worsening.  Recent baseline creatinine 1.9-2.2.  Her creatinine is 2.35, BUN 25. -Hold lisinopril -Follow-up by BMP      DVT ppx:   SQ Lovenox Code Status: Full code Family Communication: not done, no family member is at bed side.   Disposition Plan:  Anticipate discharge back to previous environment Consults called:  none Admission status: Med-surg bed as inpt       Status is: Inpatient  Remains inpatient appropriate because:Inpatient level of care appropriate due to severity of illness.  Patient is on multiple computers, now presents with pneumonia due to COVID-19 infection, UTI due to Proteus.  Her presentation is highly complicated.  Patient is at high risk of deteriorating given multiple comorbidities.  Patient will need to be treated in hospital for at least 2 days.   Dispo: The patient is from: Home              Anticipated d/c is to: Home              Anticipated d/c date is: 2 days              Patient currently is not medically stable to d/c.          Date of Service 09/19/2019    Ivor Costa [facility]   If 7PM-7AM, please contact night-coverage [email or web address] 09/19/2019, 1:22 PM

## 2019-09-20 LAB — COMPREHENSIVE METABOLIC PANEL
ALT: 11 U/L (ref 0–44)
AST: 16 U/L (ref 15–41)
Albumin: 3.4 g/dL — ABNORMAL LOW (ref 3.5–5.0)
Alkaline Phosphatase: 96 U/L (ref 38–126)
Anion gap: 12 (ref 5–15)
BUN: 39 mg/dL — ABNORMAL HIGH (ref 6–20)
CO2: 17 mmol/L — ABNORMAL LOW (ref 22–32)
Calcium: 8.9 mg/dL (ref 8.9–10.3)
Chloride: 110 mmol/L (ref 98–111)
Creatinine, Ser: 2.36 mg/dL — ABNORMAL HIGH (ref 0.44–1.00)
GFR calc Af Amer: 25 mL/min — ABNORMAL LOW (ref >60)
GFR calc non Af Amer: 22 mL/min — ABNORMAL LOW (ref >60)
Glucose, Bld: 133 mg/dL — ABNORMAL HIGH (ref 70–99)
Potassium: 4.2 mmol/L (ref 3.5–5.1)
Sodium: 139 mmol/L (ref 135–145)
Total Bilirubin: 0.5 mg/dL (ref 0.3–1.2)
Total Protein: 7 g/dL (ref 6.5–8.1)

## 2019-09-20 LAB — CBC WITH DIFFERENTIAL/PLATELET
Abs Immature Granulocytes: 0.02 10*3/uL (ref 0.00–0.07)
Basophils Absolute: 0 10*3/uL (ref 0.0–0.1)
Basophils Relative: 0 %
Eosinophils Absolute: 0 10*3/uL (ref 0.0–0.5)
Eosinophils Relative: 0 %
HCT: 33.7 % — ABNORMAL LOW (ref 36.0–46.0)
Hemoglobin: 10.7 g/dL — ABNORMAL LOW (ref 12.0–15.0)
Immature Granulocytes: 1 %
Lymphocytes Relative: 23 %
Lymphs Abs: 0.9 10*3/uL (ref 0.7–4.0)
MCH: 30.2 pg (ref 26.0–34.0)
MCHC: 31.8 g/dL (ref 30.0–36.0)
MCV: 95.2 fL (ref 80.0–100.0)
Monocytes Absolute: 0.2 10*3/uL (ref 0.1–1.0)
Monocytes Relative: 5 %
Neutro Abs: 2.7 10*3/uL (ref 1.7–7.7)
Neutrophils Relative %: 71 %
Platelets: 184 10*3/uL (ref 150–400)
RBC: 3.54 MIL/uL — ABNORMAL LOW (ref 3.87–5.11)
RDW: 14.4 % (ref 11.5–15.5)
Smear Review: NORMAL
WBC: 3.7 10*3/uL — ABNORMAL LOW (ref 4.0–10.5)
nRBC: 0 % (ref 0.0–0.2)

## 2019-09-20 LAB — BRAIN NATRIURETIC PEPTIDE: B Natriuretic Peptide: 61.9 pg/mL (ref 0.0–100.0)

## 2019-09-20 LAB — TROPONIN I (HIGH SENSITIVITY): Troponin I (High Sensitivity): <2 ng/L (ref <18)

## 2019-09-20 LAB — C-REACTIVE PROTEIN
CRP: 7.4 mg/dL — ABNORMAL HIGH (ref <1.0)
CRP: 6.3 mg/dL — ABNORMAL HIGH (ref <1.0)

## 2019-09-20 LAB — HIV ANTIBODY (ROUTINE TESTING W REFLEX): HIV Screen 4th Generation wRfx: NONREACTIVE

## 2019-09-20 LAB — LACTATE DEHYDROGENASE: LDH: 180 U/L (ref 98–192)

## 2019-09-20 LAB — FERRITIN
Ferritin: 149 ng/mL (ref 11–307)
Ferritin: 151 ng/mL (ref 11–307)

## 2019-09-20 LAB — FIBRIN DERIVATIVES D-DIMER (ARMC ONLY)
Fibrin derivatives D-dimer (ARMC): 1493.2 ng/mL (FEU) — ABNORMAL HIGH (ref 0.00–499.00)
Fibrin derivatives D-dimer (ARMC): 1231.65 ng/mL (FEU) — ABNORMAL HIGH (ref 0.00–499.00)

## 2019-09-20 LAB — FIBRINOGEN: Fibrinogen: 484 mg/dL — ABNORMAL HIGH (ref 210–475)

## 2019-09-20 LAB — TRIGLYCERIDES: Triglycerides: 72 mg/dL (ref <150)

## 2019-09-20 LAB — PROCALCITONIN: Procalcitonin: <0.1 ng/mL

## 2019-09-20 LAB — HEPATITIS B SURFACE ANTIGEN: Hepatitis B Surface Ag: NONREACTIVE

## 2019-09-20 NOTE — Progress Notes (Addendum)
PROGRESS NOTE    Jessica Patterson  OIZ:[id number] DOB: Apr 11, 1960 DOA: 09/19/2019 PCP: Ellamae Sia, MD   Brief Narrative: 59 year old with past medical history significant for hypertension, CKD stage IV, kidney stone, recurrent UTIs status post colostomy who presents complaining of worsening shortness of breath and dysuria.  Patient reported dyspnea, cough, generalized weakness.  She was tested positive for COVID-19 on 09/17/2019.  She was also recently diagnosed with UTI.  Urine grew with Proteus, which was resistant to oral antibiotics she was advised to presented to the ED.    Evaluation in the ED: COVID-19 positive, UA 21-50 white blood cell, chest x-ray: Showed patchy infiltrate bilaterally.  -CT abdomen, pelvis; interval development of patchy groundglass opacity throughout the visualized lower lung zones.  Findings consistent with atypical infection.  Bilateral renal cortical scarring 10 mm calculus in the left upper pole kidney.  No ureteric obstruction evident.  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  Interval removal of left ventral abdominal wall mesh. Multiple small bowel loops are noted within a infraumbilical midline ventral abdominal wall defect, without definite signs of bowel obstruction. Advise correlation with reducibility.     Assessment & Plan:   Principal Problem:   Pneumonia due to COVID-19 virus Active Problems:   Recurrent UTI   Hypertension   CKD (chronic kidney disease), stage IV ([place])  1-Pneumonia due to COVID-19 virus: Patient presents Dyspnea, chest x ray positive for PNA>  Continue with Albuterol and Atrovent Continue with  Remdesivir dexamethasone, day 2.  Continue with vitamin C and zinc Leukopenia secondary to viral illness  2-Recurrent UTI due to Proteus infection: Per report urine grew proteous, no sensitive to oral antibiotics.  Continue with Zosyn.  Follow urine culture ; gram negative rods.   3-Hypertension; PRN  hydralazine.   4-CKD stage IV: Metabolic acidosis Recent baseline 1.9--2.2 Cr stable.  Continue with sodium bicarb tablet  Estimated body mass index is 27.21 kg/m as calculated from the following:   Height as of this encounter: 5\' 1"  (1.549 m).   Weight as of this encounter: 65.3 kg.   DVT prophylaxis: Lovenox Code Status: Full code Family Communication: Care discussed with patient Disposition Plan:  Status is: Inpatient  Remains inpatient appropriate because:Unsafe d/c plan and IV treatments appropriate due to intensity of illness or inability to take PO   Dispo: The patient is from: Home              Anticipated d/c is to: Home              Anticipated d/c date is: 2 days              Patient currently is not medically stable to d/c.        Consultants:   None  Procedures:   None  Antimicrobials:  Zosyn  Subjective: Patient is alert and conversant.  She denies shortness of breath.  She denies abdominal pain.  Objective: Vitals:   09/19/19 1858 09/19/19 2045 09/20/19 0145 09/20/19 0431  BP:  (!) 142/87 124/78 118/79  Pulse: 72 75 64 63  Resp: 20 16 16 16   Temp:  98.4 F (36.9 C) 98 F (36.7 C) 98 F (36.7 C)  TempSrc:  Oral Oral Oral  SpO2: 95% 96% 92% 93%  Weight:      Height:        Intake/Output Summary (Last 24 hours) at 09/20/2019 0723 Last data filed at 09/20/2019 0600 Gross per 24 hour  Intake  107.86 ml  Output --  Net 107.86 ml   Filed Weights   09/18/19 1836  Weight: 65.3 kg    Examination:  General exam: Appears calm and comfortable  Respiratory system: Bilateral crackles ,respiratory effort normal. Cardiovascular system: S1 & S2 heard, RRR. No JVD, murmurs, rubs, gallops or clicks. No pedal edema. Gastrointestinal system: Abdomen is nondistended, soft and nontender. No organomegaly or masses felt. Normal bowel sounds heard. Central nervous system: Alert and oriented.  Following commands Extremities: Symmetric 5 x 5  power.   Data Reviewed: I have personally reviewed following labs and imaging studies  CBC: Recent Labs  Lab 09/18/19 1854 09/20/19 0643  WBC 4.2 3.7*  NEUTROABS 2.8 PENDING  HGB 11.0* 10.7*  HCT 34.2* 33.7*  MCV 94.7 95.2  PLT 184 850   Basic Metabolic Panel: Recent Labs  Lab 09/18/19 1854  NA 136  K 3.7  CL 112*  CO2 16*  GLUCOSE 128*  BUN 25*  CREATININE 2.35*  CALCIUM 8.5*   GFR: Estimated Creatinine Clearance: 22.3 mL/min (A) (by C-G formula based on SCr of 2.35 mg/dL (H)). Liver Function Tests: Recent Labs  Lab 09/18/19 1854  AST 19  ALT 11  ALKPHOS 104  BILITOT 0.6  PROT 7.3  ALBUMIN 3.8   No results for input(s): LIPASE, AMYLASE in the last 168 hours. No results for input(s): AMMONIA in the last 168 hours. Coagulation Profile: No results for input(s): INR, PROTIME in the last 168 hours. Cardiac Enzymes: No results for input(s): CKTOTAL, CKMB, CKMBINDEX, TROPONINI in the last 168 hours. BNP (last 3 results) No results for input(s): PROBNP in the last 8760 hours. HbA1C: No results for input(s): HGBA1C in the last 72 hours. CBG: No results for input(s): GLUCAP in the last 168 hours. Lipid Profile: Recent Labs    09/19/19 2331  TRIG 72   Thyroid Function Tests: No results for input(s): TSH, T4TOTAL, FREET4, T3FREE, THYROIDAB in the last 72 hours. Anemia Panel: Recent Labs    09/19/19 2331  FERRITIN 149   Sepsis Labs: Recent Labs  Lab 09/18/19 1925 09/19/19 2331  PROCALCITON  --  <0.10  LATICACIDVEN 0.7  --     Recent Results (from the past 240 hour(s))  Culture, blood (routine x 2)     Status: None (Preliminary result)   Collection Time: 09/19/19  5:14 AM   Specimen: BLOOD  Result Value Ref Range Status   Specimen Description BLOOD RIGHT ANTECUBITAL  Final   Special Requests   Final    BOTTLES DRAWN AEROBIC AND ANAEROBIC Blood Culture adequate volume   Culture   Final    NO GROWTH 1 DAY Performed at [facility],  [address], [place], [place] [address]    Report Status PENDING  Incomplete  Culture, blood (routine x 2)     Status: None (Preliminary result)   Collection Time: 09/19/19  5:14 AM   Specimen: BLOOD  Result Value Ref Range Status   Specimen Description BLOOD LEFT ANTECUBITAL  Final   Special Requests   Final    BOTTLES DRAWN AEROBIC AND ANAEROBIC Blood Culture results may not be optimal due to an excessive volume of blood received in culture bottles   Culture   Final    NO GROWTH 1 DAY Performed at [facility], [address] [place], [place] [address]    Report Status PENDING  Incomplete  SARS Coronavirus 2 by RT PCR (hospital order, performed in [facility] hospital lab) Nasopharyngeal Nasopharyngeal  Swab     Status: Abnormal   Collection Time: 09/19/19  5:15 AM   Specimen: Nasopharyngeal Swab  Result Value Ref Range Status   SARS Coronavirus 2 POSITIVE (A) NEGATIVE Final    Comment: RESULT CALLED TO, READ BACK BY AND VERIFIED WITH: Georgann Housekeeper AT 2952 ON 09/19/2019 BY Epifanio Lesches S Performed at [facility], [address], [place], [place] [address]          Radiology Studies: DG Chest Port 1 View  Result Date: 09/19/2019 CLINICAL DATA:  Fever and shortness of breath, COVID-19 positivity EXAM: PORTABLE CHEST 1 VIEW COMPARISON:  None. FINDINGS: Cardiac shadow is at the upper limits of normal in size. The lungs are well aerated bilaterally. Patchy opacities are seen in the bases consistent with the given clinical history. No sizable effusion is noted. No bony abnormality is noted. IMPRESSION: Mild patchy opacities in the bases consistent with the given clinical history. Electronically Signed   By: Inez Catalina M.D.   On: 09/19/2019 05:58        Scheduled Meds: . vitamin C  500 mg Oral Daily  . enoxaparin (LOVENOX) injection  30 mg Subcutaneous Q24H  . ipratropium  2 puff Inhalation Q4H  . loratadine  10 mg Oral Daily  . methylPREDNISolone  (SOLU-MEDROL) injection  30 mg Intravenous Q12H  . sodium bicarbonate  1,300 mg Oral TID  . zinc sulfate  220 mg Oral Daily   Continuous Infusions: . piperacillin-tazobactam (ZOSYN)  IV 12.5 mL/hr at 09/20/19 0600  . remdesivir 100 mg in NS 100 mL       LOS: 1 day    Time spent: 35 minutes.     Elmarie Shiley, MD [facility]   If 7PM-7AM, please contact night-coverage [email or web address]  09/20/2019, 7:23 AM

## 2019-09-21 LAB — CBC WITH DIFFERENTIAL/PLATELET
Abs Immature Granulocytes: 0.07 10*3/uL (ref 0.00–0.07)
Basophils Absolute: 0 10*3/uL (ref 0.0–0.1)
Basophils Relative: 0 %
Eosinophils Absolute: 0 10*3/uL (ref 0.0–0.5)
Eosinophils Relative: 0 %
HCT: 34.1 % — ABNORMAL LOW (ref 36.0–46.0)
Hemoglobin: 11 g/dL — ABNORMAL LOW (ref 12.0–15.0)
Immature Granulocytes: 1 %
Lymphocytes Relative: 12 %
Lymphs Abs: 1 10*3/uL (ref 0.7–4.0)
MCH: 30.7 pg (ref 26.0–34.0)
MCHC: 32.3 g/dL (ref 30.0–36.0)
MCV: 95.3 fL (ref 80.0–100.0)
Monocytes Absolute: 0.2 10*3/uL (ref 0.1–1.0)
Monocytes Relative: 3 %
Neutro Abs: 7.2 10*3/uL (ref 1.7–7.7)
Neutrophils Relative %: 84 %
Platelets: 223 10*3/uL (ref 150–400)
RBC: 3.58 MIL/uL — ABNORMAL LOW (ref 3.87–5.11)
RDW: 14.4 % (ref 11.5–15.5)
WBC: 8.5 10*3/uL (ref 4.0–10.5)
nRBC: 0 % (ref 0.0–0.2)

## 2019-09-21 LAB — COMPREHENSIVE METABOLIC PANEL
ALT: 11 U/L (ref 0–44)
AST: 15 U/L (ref 15–41)
Albumin: 3.4 g/dL — ABNORMAL LOW (ref 3.5–5.0)
Alkaline Phosphatase: 79 U/L (ref 38–126)
Anion gap: 11 (ref 5–15)
BUN: 45 mg/dL — ABNORMAL HIGH (ref 6–20)
CO2: 18 mmol/L — ABNORMAL LOW (ref 22–32)
Calcium: 8.8 mg/dL — ABNORMAL LOW (ref 8.9–10.3)
Chloride: 111 mmol/L (ref 98–111)
Creatinine, Ser: 2.22 mg/dL — ABNORMAL HIGH (ref 0.44–1.00)
GFR calc Af Amer: 27 mL/min — ABNORMAL LOW (ref >60)
GFR calc non Af Amer: 23 mL/min — ABNORMAL LOW (ref >60)
Glucose, Bld: 123 mg/dL — ABNORMAL HIGH (ref 70–99)
Potassium: 4 mmol/L (ref 3.5–5.1)
Sodium: 140 mmol/L (ref 135–145)
Total Bilirubin: 0.6 mg/dL (ref 0.3–1.2)
Total Protein: 6.8 g/dL (ref 6.5–8.1)

## 2019-09-21 LAB — URINE CULTURE: Culture: 80000 — AB

## 2019-09-21 LAB — FERRITIN: Ferritin: 171 ng/mL (ref 11–307)

## 2019-09-21 LAB — FIBRIN DERIVATIVES D-DIMER (ARMC ONLY): Fibrin derivatives D-dimer (ARMC): 909.01 ng/mL (FEU) — ABNORMAL HIGH (ref 0.00–499.00)

## 2019-09-21 LAB — LACTIC ACID, PLASMA: Lactic Acid, Venous: 0.7 mmol/L (ref 0.5–1.9)

## 2019-09-21 LAB — C-REACTIVE PROTEIN: CRP: 3.2 mg/dL — ABNORMAL HIGH (ref <1.0)

## 2019-09-21 MED ORDER — SODIUM CHLORIDE 0.9 % IV SOLN
2.0000 g | INTRAVENOUS | Status: DC
  Administered 2019-09-21 – 2019-09-22 (×2): 2 g via INTRAVENOUS
  Filled 2019-09-21 (×3): qty 2

## 2019-09-21 MED ORDER — SODIUM CHLORIDE 0.9 % IV SOLN
INTRAVENOUS | Status: DC | PRN
  Administered 2019-09-21: 01:00:00 250 mL via INTRAVENOUS

## 2019-09-21 NOTE — Progress Notes (Signed)
PROGRESS NOTE    Jessica Patterson  MWU:[id number] DOB: 1960-02-14 DOA: 09/19/2019 PCP: Ellamae Sia, MD   Brief Narrative: 59 year old with past medical history significant for hypertension, CKD stage IV, kidney stone, recurrent UTIs status post colostomy who presents complaining of worsening shortness of breath and dysuria.  Patient reported dyspnea, cough, generalized weakness.  She was tested positive for COVID-19 on 09/17/2019.  She was also recently diagnosed with UTI.  Urine grew with Proteus, which was resistant to oral antibiotics she was advised to presented to the ED.    Evaluation in the ED: COVID-19 positive, UA 21-50 white blood cell, chest x-ray: Showed patchy infiltrate bilaterally.  -CT abdomen, pelvis; interval development of patchy groundglass opacity throughout the visualized lower lung zones.  Findings consistent with atypical infection.  Bilateral renal cortical scarring 10 mm calculus in the left upper pole kidney.  No ureteric obstruction evident.  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  Interval removal of left ventral abdominal wall mesh. Multiple small bowel loops are noted within a infraumbilical midline ventral abdominal wall defect, without definite signs of bowel obstruction. Advise correlation with reducibility.     Assessment & Plan:   Principal Problem:   Pneumonia due to COVID-19 virus Active Problems:   Recurrent UTI   Hypertension   CKD (chronic kidney disease), stage IV ([place])  1-Pneumonia due to COVID-19 virus: Patient presents Dyspnea, chest x ray positive for PNA>  Continue with Albuterol and Atrovent Continue with  Remdesivir dexamethasone, day 3.  Continue with vitamin C and zinc Leukopenia secondary to viral illness  2-Recurrent UTI due to Proteus infection, Pseudomonas UTI Per report urine grew proteous, no sensitive to oral antibiotics. Sensitive to Zosyn.  Received 2 days of Zosyn. Antibiotics change to  cefazolin.  Follow urine culture; grew Pseudomonas.  Plan to treat with cefazolin for another day.   3-Hypertension; PRN hydralazine.   4-CKD stage IV: Metabolic acidosis Recent baseline 1.9--2.2 Cr stable.  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 -folow up with primary urology.   Estimated body mass index is 27.21 kg/m as calculated from the following:   Height as of this encounter: 5\' 1"  (1.549 m).   Weight as of this encounter: 65.3 kg.   DVT prophylaxis: Lovenox Code Status: Full code Family Communication: Care discussed with patient Disposition Plan:  Status is: Inpatient  Remains inpatient appropriate because:Unsafe d/c plan and IV treatments appropriate due to intensity of illness or inability to take PO   Dispo: The patient is from: Home              Anticipated d/c is to: Home              Anticipated d/c date is: 2 days              Patient currently is not medically stable to d/c. Plan to discharge 8/17 after completion of antibiotics. No good oral option for pseudomonas.         Consultants:   None  Procedures:   None  Antimicrobials:  Zosyn  Subjective: She is feeling well. She wants to go home, but understand she needs IV antibiotics.   Objective: Vitals:   09/21/19 0013 09/21/19 0555 09/21/19 0752 09/21/19 1226  BP: (!) 150/87 136/83 140/80 (!) 154/76  Pulse: 63 (!) 58 60 60  Resp:  16 16 12 14   Temp: 98.3 F (36.8 C) 98.4 F (36.9 C) 98.4 F (36.9 C) 97.8 F (36.6 C)  TempSrc: Oral Oral Oral Oral  SpO2: 94% 92% 94% 96%  Weight:      Height:        Intake/Output Summary (Last 24 hours) at 09/21/2019 1331 Last data filed at 09/21/2019 0449 Gross per 24 hour  Intake 248.18 ml  Output --  Net 248.18 ml   Filed Weights   09/18/19 1836  Weight:  65.3 kg    Examination:  General exam: NAD Respiratory system: Fine crackles.  Cardiovascular system: S 1, S 2 RRR Gastrointestinal system:  BS present, soft, nt Central nervous system: Alert, follows command Extremities: no edema   Data Reviewed: I have personally reviewed following labs and imaging studies  CBC: Recent Labs  Lab 09/18/19 1854 09/20/19 0643 09/21/19 0604  WBC 4.2 3.7* 8.5  NEUTROABS 2.8 2.7 7.2  HGB 11.0* 10.7* 11.0*  HCT 34.2* 33.7* 34.1*  MCV 94.7 95.2 95.3  PLT 184 184 962   Basic Metabolic Panel: Recent Labs  Lab 09/18/19 1854 09/20/19 0643 09/21/19 0604  NA 136 139 140  K 3.7 4.2 4.0  CL 112* 110 111  CO2 16* 17* 18*  GLUCOSE 128* 133* 123*  BUN 25* 39* 45*  CREATININE 2.35* 2.36* 2.22*  CALCIUM 8.5* 8.9 8.8*   GFR: Estimated Creatinine Clearance: 23.6 mL/min (A) (by C-G formula based on SCr of 2.22 mg/dL (H)). Liver Function Tests: Recent Labs  Lab 09/18/19 1854 09/20/19 0643 09/21/19 0604  AST 19 16 15   ALT 11 11 11   ALKPHOS 104 96 79  BILITOT 0.6 0.5 0.6  PROT 7.3 7.0 6.8  ALBUMIN 3.8 3.4* 3.4*   No results for input(s): LIPASE, AMYLASE in the last 168 hours. No results for input(s): AMMONIA in the last 168 hours. Coagulation Profile: No results for input(s): INR, PROTIME in the last 168 hours. Cardiac Enzymes: No results for input(s): CKTOTAL, CKMB, CKMBINDEX, TROPONINI in the last 168 hours. BNP (last 3 results) No results for input(s): PROBNP in the last 8760 hours. HbA1C: No results for input(s): HGBA1C in the last 72 hours. CBG: No results for input(s): GLUCAP in the last 168 hours. Lipid Profile: Recent Labs    09/19/19 2331  TRIG 72   Thyroid Function Tests: No results for input(s): TSH, T4TOTAL, FREET4, T3FREE, THYROIDAB in the last 72 hours. Anemia Panel: Recent Labs    09/20/19 0643 09/21/19 0604  FERRITIN 151 171   Sepsis Labs: Recent Labs  Lab 09/18/19 1925 09/19/19 2331 09/21/19 0604   PROCALCITON  --  <0.10  --   LATICACIDVEN 0.7  --  0.7    Recent Results (from the past 240 hour(s))  Urine culture     Status: Abnormal   Collection Time: 09/18/19  6:55 PM   Specimen: Urine, Random  Result Value Ref Range Status   Specimen Description   Final    URINE, RANDOM Performed at [facility], [address] [place], [place] [address]    Special Requests   Final    NONE Performed at [facility], [place], [place] [address]    Culture 80,000 COLONIES/mL PSEUDOMONAS AERUGINOSA (A)  Final   Report Status 09/21/2019 FINAL  Final   Organism ID, Bacteria PSEUDOMONAS AERUGINOSA (A)  Final      Susceptibility   Pseudomonas aeruginosa - MIC*    CEFTAZIDIME 4 SENSITIVE Sensitive  CIPROFLOXACIN >=4 RESISTANT Resistant     GENTAMICIN 8 INTERMEDIATE Intermediate     IMIPENEM 2 SENSITIVE Sensitive     PIP/TAZO Value in next row Sensitive      SENSITIVEMIC 32 REQUESTED BY DR Davena Julian    * 80,000 COLONIES/mL PSEUDOMONAS AERUGINOSA  Culture, blood (routine x 2)     Status: None (Preliminary result)   Collection Time: 09/19/19  5:14 AM   Specimen: BLOOD  Result Value Ref Range Status   Specimen Description BLOOD RIGHT ANTECUBITAL  Final   Special Requests   Final    BOTTLES DRAWN AEROBIC AND ANAEROBIC Blood Culture adequate volume   Culture   Final    NO GROWTH 2 DAYS Performed at [facility], [address] [place], [place] [address]    Report Status PENDING  Incomplete  Culture, blood (routine x 2)     Status: None (Preliminary result)   Collection Time: 09/19/19  5:14 AM   Specimen: BLOOD  Result Value Ref Range Status   Specimen Description BLOOD LEFT ANTECUBITAL  Final   Special Requests   Final    BOTTLES DRAWN AEROBIC AND ANAEROBIC Blood Culture results may not be optimal due to an excessive volume of blood received in culture bottles   Culture   Final    NO GROWTH 2 DAYS Performed at [facility], [address], [place], [place] [address]    Report Status PENDING  Incomplete  SARS Coronavirus 2 by RT PCR (hospital order, performed in [place] hospital lab) Nasopharyngeal Nasopharyngeal Swab     Status: Abnormal   Collection Time: 09/19/19  5:15 AM   Specimen: Nasopharyngeal Swab  Result Value Ref Range Status   SARS Coronavirus 2 POSITIVE (A) NEGATIVE Final    Comment: RESULT CALLED TO, READ BACK BY AND VERIFIED WITH: Georgann Housekeeper AT 5397 ON 09/19/2019 BY Epifanio Lesches S Performed at [facility], [address], [place],  [address]          Radiology Studies: No results found.      Scheduled Meds: . vitamin C  500 mg Oral Daily  . enoxaparin (LOVENOX) injection  30 mg Subcutaneous Q24H  . ipratropium  2 puff Inhalation Q4H  . loratadine  10 mg Oral Daily  . methylPREDNISolone (SOLU-MEDROL) injection  30 mg Intravenous Q12H  . sodium bicarbonate  1,300 mg Oral TID  . zinc sulfate  220 mg Oral Daily   Continuous Infusions: . sodium chloride 250 mL (09/21/19 0039)  . cefTAZidime (FORTAZ)  IV    . remdesivir 100 mg in NS 100 mL 100 mg (09/21/19 1150)     LOS: 2 days    Time spent: 35 minutes.     Elmarie Shiley, MD [facility]   If 7PM-7AM, please contact night-coverage [email or web address]  09/21/2019, 1:31 PM

## 2019-09-21 NOTE — Consult Note (Addendum)
Pharmacy Antibiotic Note  Jessica Patterson is a 59 y.o. female admitted on 09/19/2019 with proteus UTI resistant to po antibiotics. Patient was previously on Zosyn EI .  Pharmacy has been consulted for Ceftazidime dosing.   Plan: Ceftazidime 2g IV q24h   Height: 5\' 1"  (154.9 cm) Weight: 65.3 kg (144 lb) IBW/kg (Calculated) : 47.8  Temp (24hrs), Avg:98.2 F (36.8 C), Min:98 F (36.7 C), Max:98.4 F (36.9 C)  Recent Labs  Lab 09/18/19 1854 09/18/19 1925 09/20/19 0643 09/21/19 0604  WBC 4.2  --  3.7* 8.5  CREATININE 2.35*  --  2.36* 2.22*  LATICACIDVEN  --  0.7  --  0.7    Estimated Creatinine Clearance: 23.6 mL/min (A) (by C-G formula based on SCr of 2.22 mg/dL (H)).    Allergies  Allergen Reactions  . Ivp Dye [Iodinated Diagnostic Agents]   . Sulfa Antibiotics     Antimicrobials this admission: Zosyn 8/14 >> 8/16 remdesivir 8/14 >>   Dose adjustments this admission: none  Microbiology results: From [place] 8/11 MDR Proteus > 100k (Zosyn sensitive)  BCx 8/14 >> NGTD UCx 8/13 >> Pseudomonas aeruginosa sensitive to Ceftazidime and imipenem  8/14 COVID +  Thank you for allowing pharmacy to be a part of this patient's care.  Sherilyn Banker, PharmD Pharmacy Resident  09/21/2019 10:39 AM

## 2019-09-22 LAB — COMPREHENSIVE METABOLIC PANEL
ALT: 10 U/L (ref 0–44)
AST: 13 U/L — ABNORMAL LOW (ref 15–41)
Albumin: 3.1 g/dL — ABNORMAL LOW (ref 3.5–5.0)
Alkaline Phosphatase: 80 U/L (ref 38–126)
Anion gap: 8 (ref 5–15)
BUN: 49 mg/dL — ABNORMAL HIGH (ref 6–20)
CO2: 17 mmol/L — ABNORMAL LOW (ref 22–32)
Calcium: 8.4 mg/dL — ABNORMAL LOW (ref 8.9–10.3)
Chloride: 113 mmol/L — ABNORMAL HIGH (ref 98–111)
Creatinine, Ser: 2.14 mg/dL — ABNORMAL HIGH (ref 0.44–1.00)
GFR calc Af Amer: 28 mL/min — ABNORMAL LOW (ref >60)
GFR calc non Af Amer: 25 mL/min — ABNORMAL LOW (ref >60)
Glucose, Bld: 112 mg/dL — ABNORMAL HIGH (ref 70–99)
Potassium: 4.1 mmol/L (ref 3.5–5.1)
Sodium: 138 mmol/L (ref 135–145)
Total Bilirubin: 0.5 mg/dL (ref 0.3–1.2)
Total Protein: 6.4 g/dL — ABNORMAL LOW (ref 6.5–8.1)

## 2019-09-22 LAB — CBC WITH DIFFERENTIAL/PLATELET
Abs Immature Granulocytes: 0.04 10*3/uL (ref 0.00–0.07)
Basophils Absolute: 0 10*3/uL (ref 0.0–0.1)
Basophils Relative: 0 %
Eosinophils Absolute: 0 10*3/uL (ref 0.0–0.5)
Eosinophils Relative: 0 %
HCT: 33.1 % — ABNORMAL LOW (ref 36.0–46.0)
Hemoglobin: 10.7 g/dL — ABNORMAL LOW (ref 12.0–15.0)
Immature Granulocytes: 1 %
Lymphocytes Relative: 17 %
Lymphs Abs: 1.1 10*3/uL (ref 0.7–4.0)
MCH: 30.2 pg (ref 26.0–34.0)
MCHC: 32.3 g/dL (ref 30.0–36.0)
MCV: 93.5 fL (ref 80.0–100.0)
Monocytes Absolute: 0.3 10*3/uL (ref 0.1–1.0)
Monocytes Relative: 5 %
Neutro Abs: 4.9 10*3/uL (ref 1.7–7.7)
Neutrophils Relative %: 77 %
Platelets: 228 10*3/uL (ref 150–400)
RBC: 3.54 MIL/uL — ABNORMAL LOW (ref 3.87–5.11)
RDW: 14.4 % (ref 11.5–15.5)
WBC: 6.3 10*3/uL (ref 4.0–10.5)
nRBC: 0 % (ref 0.0–0.2)

## 2019-09-22 LAB — FIBRIN DERIVATIVES D-DIMER (ARMC ONLY): Fibrin derivatives D-dimer (ARMC): 601.76 ng/mL (FEU) — ABNORMAL HIGH (ref 0.00–499.00)

## 2019-09-22 LAB — C-REACTIVE PROTEIN: CRP: 1.5 mg/dL — ABNORMAL HIGH (ref <1.0)

## 2019-09-22 LAB — FERRITIN: Ferritin: 200 ng/mL (ref 11–307)

## 2019-09-22 MED ORDER — DM-GUAIFENESIN ER 30-600 MG PO TB12: 1.0000 | ORAL_TABLET | Freq: Two times a day (BID) | ORAL | 0 refills | Status: DC | PRN

## 2019-09-22 MED ORDER — ZINC SULFATE 220 (50 ZN) MG PO CAPS: 220.0000 mg | ORAL_CAPSULE | Freq: Every day | ORAL | 0 refills | Status: DC

## 2019-09-22 MED ORDER — ALBUTEROL SULFATE HFA 108 (90 BASE) MCG/ACT IN AERS: 2.0000 | INHALATION_SPRAY | RESPIRATORY_TRACT | 0 refills | Status: DC | PRN

## 2019-09-22 MED ORDER — IPRATROPIUM BROMIDE HFA 17 MCG/ACT IN AERS: 2.0000 | INHALATION_SPRAY | RESPIRATORY_TRACT | 12 refills | Status: DC

## 2019-09-22 NOTE — Progress Notes (Signed)
Patient scheduled for outpatient Remdesivir infusion tomorrow 8/18 at 1pm at [facility]. Please inform the patient to park at [place], as staff will be escorting the patient through the [place] entrance of the hospital.    There is a wave flag banner located near the 2nd entrance on [address]. Turn into this entrance and immediately turn left and park in 1 of the 5 designated Covid Infusion Parking spots. There is a phone number on the sign, please call and let the staff know what spot you are in and we will come out and get you. For questions call [phone].  Thanks.  * If patient is getting dropped off, you may have your ride pull up to the Main Entrance of [facility]. Please stay in the car and call [phone], staff will meet you at your car and escort you into the hospital and back to the clinic.

## 2019-09-22 NOTE — Discharge Summary (Signed)
Physician Discharge Summary  Jessica Patterson BHA:[id number] DOB: 1960/06/12 DOA: 09/19/2019  PCP: Ellamae Sia, MD  Admit date: 09/19/2019 Discharge date: 09/22/2019  Admitted From: Home  Disposition: Home   Recommendations for Outpatient Follow-up:  1. Follow up with PCP in 1-2 weeks 2. Please obtain BMP/CBC in one week 3. Follow up with PCP post hospital care for UTI and covid  Home Health: none  Discharge Condition: Stable.  CODE STATUS: Full code Diet recommendation: Heart Healthy  Brief/Interim Summary: 59 year old with past medical history significant for hypertension, CKD stage IV, kidney stone, recurrent UTIs status post colostomy who presents complaining of worsening shortness of breath and dysuria.  Patient reported dyspnea, cough, generalized weakness.  She was tested positive for COVID-19 on 09/17/2019.  She was also recently diagnosed with UTI.  Urine grew with Proteus, which was resistant to oral antibiotics she was advised to presented to the ED.    Evaluation in the ED: COVID-19 positive, UA 21-50 white blood cell, chest x-ray: Showed patchy infiltrate bilaterally.  -CT abdomen, pelvis; interval development of patchy groundglass opacity throughout the visualized lower lung zones.  Findings consistent with atypical infection.  Bilateral renal cortical scarring 10 mm calculus in the left upper pole kidney.  No ureteric obstruction evident.  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  Interval removal of left ventral abdominal wall mesh. Multiple small bowel loops are noted within a infraumbilical midline ventral abdominal wall defect, without definite signs of bowel obstruction.  1-Pneumonia due to COVID-19 virus: -Patient presents Dyspnea, chest x ray positive for PNA>  -Continue with Albuterol and Atrovent -Continue with  Remdesivir dexamethasone, day 4. She will complete last dose of Remdesivir out patient on 8/18.  -Would not discharge on  dexamethasone due to high risk for infection. She has remain stable form covid without  significant symptoms.  -Continue with vitamin C and zinc. -Leukopenia secondary to viral illness.  2-Recurrent UTI due to Proteus infection, Pseudomonas UTI Per report urine grew proteous, no sensitive to oral antibiotics. Sensitive to Zosyn.  Received 2 days of Zosyn. Antibiotics change to cefazolin.  Follow urine culture; grew Pseudomonas. Sensitive to zosyn and cefazolin.  Received total of 4 days of antibiotics for pseudomonas UTI. Afebrile, no leukocytosis, denies pelvis pain.  Needs to follow up with PCP for hospital follow up.   3-Hypertension; PRN hydralazine.   4-CKD stage IV: Metabolic acidosis Recent baseline 1.9--2.2 Cr stable.  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 -folow up with primary urology.     Discharge Diagnoses:  Principal Problem:   Pneumonia due to COVID-19 virus Active Problems:   Recurrent UTI   Hypertension   CKD (chronic kidney disease), stage IV [facility])    Discharge Instructions  Discharge Instructions    Diet - low sodium heart healthy   Complete by: As directed    Increase activity slowly   Complete by: As directed      Allergies as of 09/22/2019      Reactions   Ivp Dye [iodinated Diagnostic Agents]    Sulfa Antibiotics       Medication List    STOP taking these medications   cefdinir 300 MG capsule Commonly known as: OMNICEF   cephALEXin 500 MG capsule Commonly known as: KEFLEX   fosfomycin 3 g Pack Commonly known as: MONUROL   lisinopril 5  MG tablet Commonly known as: ZESTRIL     TAKE these medications   acetaminophen 325 MG tablet Commonly known as: TYLENOL Take 650 mg by mouth every 6 (six) hours as needed.   albuterol 108 (90 Base) MCG/ACT  inhaler Commonly known as: VENTOLIN HFA Inhale 2 puffs into the lungs every 4 (four) hours as needed for wheezing or shortness of breath.   Cetirizine HCl 10 MG Caps Take 10 mg by mouth as needed for Allergies   cyclobenzaprine 10 MG tablet Commonly known as: FLEXERIL Take 10 mg by mouth at bedtime as needed.   dextromethorphan-guaiFENesin 30-600 MG 12hr tablet Commonly known as: MUCINEX DM Take 1 tablet by mouth 2 (two) times daily as needed for cough.   ipratropium 17 MCG/ACT inhaler Commonly known as: ATROVENT HFA Inhale 2 puffs into the lungs every 4 (four) hours.   montelukast 10 MG tablet Commonly known as: SINGULAIR Take 10 mg by mouth as needed for Allergies   sodium bicarbonate 650 MG tablet Take 3 tablets (1,950 mg total) by mouth 3 times daily.   zinc sulfate 220 (50 Zn) MG capsule Take 1 capsule (220 mg total) by mouth daily. Start taking on: September 23, 2019       Follow-up Information    Burns, Ala Dach, MD Follow up in 1 week(s).   Specialty: Internal Medicine Contact information: [place] [address] [phone]              Allergies  Allergen Reactions  . Ivp Dye [Iodinated Diagnostic Agents]   . Sulfa Antibiotics     Consultations:  None   Procedures/Studies: DG Chest Port 1 View  Result Date: 09/19/2019 CLINICAL DATA:  Fever and shortness of breath, COVID-19 positivity EXAM: PORTABLE CHEST 1 VIEW COMPARISON:  None. FINDINGS: Cardiac shadow is at the upper limits of normal in size. The lungs are well aerated bilaterally. Patchy opacities are seen in the bases consistent with the given clinical history. No sizable effusion is noted. No bony abnormality is noted. IMPRESSION: Mild patchy opacities in the bases consistent with the given clinical history. Electronically Signed   By: Inez Catalina M.D.   On: 09/19/2019 05:58     Subjective: She denies respiratory symptoms.  Denies abdominal pain.   Discharge  Exam: Vitals:   09/22/19 0832 09/22/19 1237  BP: (!) 150/88 (!) 152/89  Pulse: (!) 58 75  Resp: 18 18  Temp: 98.2 F (36.8 C) 98.6 F (37 C)  SpO2: 94% 94%     General: Pt is alert, awake, not in acute distress Cardiovascular: RRR, S1/S2 +, no rubs, no gallops Respiratory: CTA bilaterally, no wheezing, no rhonchi Abdominal: Soft, NT, ND, bowel sounds +ostomy in place Extremities: no edema, no cyanosis    The results of significant diagnostics from this hospitalization (including imaging, microbiology, ancillary and laboratory) are listed below for reference.     Microbiology: Recent Results (from the past 240 hour(s))  Urine culture     Status: Abnormal   Collection Time: 09/18/19  6:55 PM   Specimen: Urine, Random  Result Value Ref Range Status   Specimen Description   Final    URINE, RANDOM Performed at [facility], [address] [place], [place] [address]    Special Requests   Final    NONE Performed at [facility], [place], [place] [address]    Culture 80,000 COLONIES/mL PSEUDOMONAS AERUGINOSA (A)  Final   Report Status 09/21/2019  FINAL  Final   Organism ID, Bacteria PSEUDOMONAS AERUGINOSA (A)  Final      Susceptibility   Pseudomonas aeruginosa - MIC*    CEFTAZIDIME 4 SENSITIVE Sensitive     CIPROFLOXACIN >=4 RESISTANT Resistant     GENTAMICIN 8 INTERMEDIATE Intermediate     IMIPENEM 2 SENSITIVE Sensitive     PIP/TAZO Value in next row Sensitive      SENSITIVEMIC 32 REQUESTED BY DR Fynn Vanblarcom    * 80,000 COLONIES/mL PSEUDOMONAS AERUGINOSA  Culture, blood (routine x 2)     Status: None (Preliminary result)   Collection Time: 09/19/19  5:14 AM   Specimen: BLOOD  Result Value Ref Range Status   Specimen Description BLOOD RIGHT ANTECUBITAL  Final   Special Requests   Final    BOTTLES DRAWN AEROBIC AND ANAEROBIC Blood Culture adequate volume   Culture   Final    NO GROWTH 3 DAYS Performed at [facility], [address] [place], [place] [address]    Report Status PENDING  Incomplete  Culture, blood (routine x 2)     Status: None (Preliminary result)   Collection Time: 09/19/19  5:14 AM   Specimen: BLOOD  Result Value Ref Range Status   Specimen Description BLOOD LEFT ANTECUBITAL  Final   Special Requests   Final    BOTTLES DRAWN AEROBIC AND ANAEROBIC Blood Culture results may not be optimal due to an excessive volume of blood received in culture bottles   Culture   Final    NO GROWTH 3 DAYS Performed at [facility], [address] [place], [place] [address]    Report Status PENDING  Incomplete  SARS Coronavirus 2 by RT PCR (hospital order, performed in [place] hospital lab) Nasopharyngeal Nasopharyngeal Swab     Status: Abnormal   Collection Time: 09/19/19  5:15 AM   Specimen: Nasopharyngeal Swab  Result Value Ref Range Status   SARS Coronavirus 2 POSITIVE (A) NEGATIVE Final    Comment: RESULT CALLED TO, READ BACK BY AND VERIFIED WITH: Georgann Housekeeper AT 3568 ON 09/19/2019 BY Epifanio Lesches S Performed at [facility], [address] [place],  [address]      Labs: BNP (last 3 results) Recent Labs    09/19/19 2331  BNP 72.9   Basic Metabolic Panel: Recent Labs  Lab 09/18/19 1854 09/20/19 0643 09/21/19 0604 09/22/19 0631  NA 136 139 140 138  K 3.7 4.2 4.0 4.1  CL 112* 110 111 113*  CO2 16* 17* 18* 17*  GLUCOSE 128* 133* 123* 112*  BUN 25* 39* 45* 49*  CREATININE 2.35* 2.36* 2.22* 2.14*  CALCIUM 8.5* 8.9 8.8* 8.4*   Liver Function Tests: Recent Labs  Lab 09/18/19 1854 09/20/19 0643 09/21/19 0604 09/22/19 0631  AST 19 16 15  13*  ALT 11 11 11 10   ALKPHOS 104 96 79 80  BILITOT 0.6 0.5 0.6 0.5  PROT 7.3 7.0 6.8 6.4*  ALBUMIN 3.8 3.4* 3.4* 3.1*   No results for input(s): LIPASE, AMYLASE in the last 168 hours. No results for input(s): AMMONIA in the last 168 hours. CBC: Recent Labs  Lab 09/18/19 1854 09/20/19 0643 09/21/19 0604  09/22/19 0631  WBC 4.2 3.7* 8.5 6.3  NEUTROABS 2.8 2.7 7.2 4.9  HGB 11.0* 10.7* 11.0* 10.7*  HCT 34.2* 33.7* 34.1* 33.1*  MCV 94.7 95.2 95.3 93.5  PLT 184 184 223 228   Cardiac Enzymes: No results for input(s): CKTOTAL, CKMB, CKMBINDEX, TROPONINI in the last 168 hours.  BNP: Invalid input(s): POCBNP CBG: No results for input(s): GLUCAP in the last 168 hours. D-Dimer No results for input(s): DDIMER in the last 72 hours. Hgb A1c No results for input(s): HGBA1C in the last 72 hours. Lipid Profile Recent Labs    09/19/19 2331  TRIG 72   Thyroid function studies No results for input(s): TSH, T4TOTAL, T3FREE, THYROIDAB in the last 72 hours.  Invalid input(s): FREET3 Anemia work up Recent Labs    09/21/19 0604 09/22/19 0631  FERRITIN 171 200   Urinalysis    Component Value Date/Time   COLORURINE YELLOW (A) 09/18/2019 1854   APPEARANCEUR HAZY (A) 09/18/2019 1854   LABSPEC 1.011 09/18/2019 1854   PHURINE 7.0 09/18/2019 1854   GLUCOSEU NEGATIVE 09/18/2019 1854   HGBUR NEGATIVE 09/18/2019 [place] 09/18/2019 [place] 09/18/2019 1854   PROTEINUR 100 (A) 09/18/2019 1854   NITRITE NEGATIVE 09/18/2019 1854   LEUKOCYTESUR SMALL (A) 09/18/2019 1854   Sepsis Labs Invalid input(s): PROCALCITONIN,  WBC,  LACTICIDVEN Microbiology Recent Results (from the past 240 hour(s))  Urine culture     Status: Abnormal   Collection Time: 09/18/19  6:55 PM   Specimen: Urine, Random  Result Value Ref Range Status   Specimen Description   Final    URINE, RANDOM Performed at [facility], [address] [place], [place] [address]    Special Requests   Final    NONE Performed at [facility], [address] [place], [place] [address]    Culture 80,000 COLONIES/mL PSEUDOMONAS AERUGINOSA (A)  Final   Report Status 09/21/2019 FINAL  Final   Organism ID, Bacteria PSEUDOMONAS AERUGINOSA (A)  Final      Susceptibility   Pseudomonas  aeruginosa - MIC*    CEFTAZIDIME 4 SENSITIVE Sensitive     CIPROFLOXACIN >=4 RESISTANT Resistant     GENTAMICIN 8 INTERMEDIATE Intermediate     IMIPENEM 2 SENSITIVE Sensitive     PIP/TAZO Value in next row Sensitive      SENSITIVEMIC 32 REQUESTED BY DR Estephanie Hubbs    * 80,000 COLONIES/mL PSEUDOMONAS AERUGINOSA  Culture, blood (routine x 2)     Status: None (Preliminary result)   Collection Time: 09/19/19  5:14 AM   Specimen: BLOOD  Result Value Ref Range Status   Specimen Description BLOOD RIGHT ANTECUBITAL  Final   Special Requests   Final    BOTTLES DRAWN AEROBIC AND ANAEROBIC Blood Culture adequate volume   Culture   Final    NO GROWTH 3 DAYS Performed at [facility], [address] [place], [place] [address]    Report Status PENDING  Incomplete  Culture, blood (routine x 2)     Status: None (Preliminary result)   Collection Time: 09/19/19  5:14 AM   Specimen: BLOOD  Result Value Ref Range Status   Specimen Description BLOOD LEFT ANTECUBITAL  Final   Special Requests   Final    BOTTLES DRAWN AEROBIC AND ANAEROBIC Blood Culture results may not be optimal due to an excessive volume of blood received in culture bottles   Culture   Final    NO GROWTH 3 DAYS Performed at [facility], [address] [place], [place] [address]    Report Status PENDING  Incomplete  SARS Coronavirus 2 by RT PCR (hospital order, performed in [place] hospital lab) Nasopharyngeal Nasopharyngeal Swab     Status: Abnormal   Collection Time: 09/19/19  5:15 AM   Specimen: Nasopharyngeal  Swab  Result Value Ref Range Status   SARS Coronavirus 2 POSITIVE (A) NEGATIVE Final    Comment: RESULT CALLED TO, READ BACK BY AND VERIFIED WITH: Georgann Housekeeper AT 4174 ON 09/19/2019 BY Epifanio Lesches S Performed at [facility], [address] [place], [place] [address]      Time coordinating discharge: 40 minutes  SIGNED:   Elmarie Shiley, MD  [facility]

## 2019-09-22 NOTE — Discharge Instructions (Signed)
Patient scheduled for outpatient Remdesivir infusion tomorrow 8/18 at 1pm at [facility]. Please inform the patient to park [place], [place], as staff will be escorting the patient through the [place] entrance of the hospital.  There is a wave flag banner located near the 2nd entrance on [address]. Turn into this entranceand immediatelyturn left and park in 1 of the 5 designated Covid Infusion Parking spots. There is a phone number on the sign, please call and let the staff know what spot you are in and we will come out and get you. For questions call [phone]. Thanks.  * If patient is getting dropped off, you may have your ride pull up to the Main Entrance of [facility]. Please stay in the car and call [phone], staff will meet you at your car and escort you into the hospital and back to the clinic.

## 2019-09-23 ENCOUNTER — Ambulatory Visit (HOSPITAL_COMMUNITY)
Admit: 2019-09-23 | Discharge: 2019-09-23 | Disposition: A | Discharge status: Home / Self Care | Payer: Medicare Other | Attending: Pulmonary Disease | Admitting: Pulmonary Disease

## 2019-09-23 DIAGNOSIS — J1282 Pneumonia due to coronavirus disease 2019: Secondary | ICD-10-CM | POA: Insufficient documentation

## 2019-09-23 DIAGNOSIS — U071 COVID-19: Secondary | ICD-10-CM | POA: Insufficient documentation

## 2019-09-23 MED ORDER — EPINEPHRINE 0.3 MG/0.3ML IJ SOAJ: 0.3000 mg | Freq: Once | INTRAMUSCULAR | Status: DC | PRN

## 2019-09-23 MED ORDER — FAMOTIDINE IN NACL 20-0.9 MG/50ML-% IV SOLN: 20.0000 mg | Freq: Once | INTRAVENOUS | Status: DC | PRN

## 2019-09-23 MED ORDER — SODIUM CHLORIDE 0.9 % IV SOLN: INTRAVENOUS | Status: DC | PRN

## 2019-09-23 MED ORDER — SODIUM CHLORIDE 0.9 % IV SOLN
100.0000 mg | Freq: Once | INTRAVENOUS | Status: AC
  Administered 2019-09-23: 100 mg via INTRAVENOUS
  Filled 2019-09-23: qty 20

## 2019-09-23 MED ORDER — METHYLPREDNISOLONE SODIUM SUCC 125 MG IJ SOLR: 125.0000 mg | Freq: Once | INTRAMUSCULAR | Status: DC | PRN

## 2019-09-23 MED ORDER — ALBUTEROL SULFATE HFA 108 (90 BASE) MCG/ACT IN AERS: 2.0000 | INHALATION_SPRAY | Freq: Once | RESPIRATORY_TRACT | Status: DC | PRN

## 2019-09-23 MED ORDER — DIPHENHYDRAMINE HCL 50 MG/ML IJ SOLN: 50.0000 mg | Freq: Once | INTRAMUSCULAR | Status: DC | PRN

## 2019-09-23 NOTE — Progress Notes (Signed)
  Diagnosis: COVID-19  Physician: Dr Asencion Noble  Procedure: Covid Infusion Clinic Med: remdesivir infusion - Provided patient with remdesivir fact sheet for patients, parents and caregivers prior to infusion.  Complications: No immediate complications noted.  Discharge: Discharged home   Jessica Patterson 09/23/2019

## 2019-09-23 NOTE — Discharge Instructions (Signed)
10 Things You Can Do to Manage Your COVID-19 Symptoms at Home If you have possible or confirmed COVID-19: 1. Stay home from work and school. And stay away from other public places. If you must go out, avoid using any kind of public transportation, ridesharing, or taxis. 2. Monitor your symptoms carefully. If your symptoms get worse, call your healthcare provider immediately. 3. Get rest and stay hydrated. 4. If you have a medical appointment, call the healthcare provider ahead of time and tell them that you have or may have COVID-19. 5. For medical emergencies, call 911 and notify the dispatch personnel that you have or may have COVID-19. 6. Cover your cough and sneezes with a tissue or use the inside of your elbow. 7. Wash your hands often with soap and water for at least 20 seconds or clean your hands with an alcohol-based hand sanitizer that contains at least 60% alcohol. 8. As much as possible, stay in a specific room and away from other people in your home. Also, you should use a separate bathroom, if available. If you need to be around other people in or outside of the home, wear a mask. 9. Avoid sharing personal items with other people in your household, like dishes, towels, and bedding. 10. Clean all surfaces that are touched often, like counters, tabletops, and doorknobs. Use household cleaning sprays or wipes according to the label instructions. cdc.gov/coronavirus 08/06/2018 This information is not intended to replace advice given to you by your health care provider. Make sure you discuss any questions you have with your health care provider. Document Revised: 01/08/2019 Document Reviewed: 01/08/2019 Elsevier Patient Education  2020 Elsevier Inc.  

## 2019-09-24 LAB — CULTURE, BLOOD (ROUTINE X 2)
Culture: NO GROWTH
Culture: NO GROWTH
Special Requests: ADEQUATE

## 2020-04-06 ENCOUNTER — Other Ambulatory Visit: Payer: Self-pay | Admitting: Obstetrics & Gynecology

## 2020-04-06 DIAGNOSIS — Z1231 Encounter for screening mammogram for malignant neoplasm of breast: Secondary | ICD-10-CM

## 2020-04-25 ENCOUNTER — Ambulatory Visit
Admission: RE | Admit: 2020-04-25 | Discharge: 2020-04-25 | Disposition: A | Discharge status: Home / Self Care | Payer: Medicare Other | Source: Ambulatory Visit | Attending: Obstetrics & Gynecology | Admitting: Obstetrics & Gynecology

## 2020-04-25 ENCOUNTER — Other Ambulatory Visit: Payer: Self-pay

## 2020-04-25 ENCOUNTER — Other Ambulatory Visit: Payer: Self-pay | Admitting: Internal Medicine

## 2020-04-25 ENCOUNTER — Other Ambulatory Visit: Payer: Self-pay | Admitting: Family Medicine

## 2020-04-25 DIAGNOSIS — Z1231 Encounter for screening mammogram for malignant neoplasm of breast: Secondary | ICD-10-CM

## 2020-05-03 ENCOUNTER — Ambulatory Visit: Payer: Medicare Other | Admitting: Podiatry

## 2020-05-03 ENCOUNTER — Other Ambulatory Visit: Payer: Self-pay | Admitting: Podiatry

## 2020-05-03 ENCOUNTER — Encounter: Payer: Self-pay | Admitting: Podiatry

## 2020-05-03 ENCOUNTER — Other Ambulatory Visit: Payer: Self-pay

## 2020-05-03 ENCOUNTER — Ambulatory Visit (INDEPENDENT_AMBULATORY_CARE_PROVIDER_SITE_OTHER): Payer: Medicare Other

## 2020-05-03 DIAGNOSIS — K219 Gastro-esophageal reflux disease without esophagitis: Secondary | ICD-10-CM | POA: Insufficient documentation

## 2020-05-03 DIAGNOSIS — D631 Anemia in chronic kidney disease: Secondary | ICD-10-CM | POA: Insufficient documentation

## 2020-05-03 DIAGNOSIS — G5780 Other specified mononeuropathies of unspecified lower limb: Secondary | ICD-10-CM

## 2020-05-03 DIAGNOSIS — E872 Acidosis, unspecified: Secondary | ICD-10-CM | POA: Insufficient documentation

## 2020-05-03 DIAGNOSIS — R112 Nausea with vomiting, unspecified: Secondary | ICD-10-CM | POA: Insufficient documentation

## 2020-05-03 DIAGNOSIS — N184 Chronic kidney disease, stage 4 (severe): Secondary | ICD-10-CM | POA: Insufficient documentation

## 2020-05-03 DIAGNOSIS — K649 Unspecified hemorrhoids: Secondary | ICD-10-CM | POA: Insufficient documentation

## 2020-05-03 DIAGNOSIS — T7840XA Allergy, unspecified, initial encounter: Secondary | ICD-10-CM | POA: Insufficient documentation

## 2020-05-03 DIAGNOSIS — Z9889 Other specified postprocedural states: Secondary | ICD-10-CM | POA: Insufficient documentation

## 2020-05-03 DIAGNOSIS — M779 Enthesopathy, unspecified: Secondary | ICD-10-CM

## 2020-05-03 MED ORDER — NONFORMULARY OR COMPOUNDED ITEM: 0 refills | Status: DC

## 2020-05-03 NOTE — Addendum Note (Signed)
Addended by: Graceann Congress D on: 05/03/2020 02:48 PM   Modules accepted: Orders

## 2020-05-03 NOTE — Progress Notes (Signed)
Subjective:  Patient ID: Jessica Patterson, female    DOB: 1961/01/07,  MRN: [id number]  Chief Complaint  Patient presents with  . Foot Pain    Patient presents today for bilat feet pain on lateral sides of feet x 6 months    60 y.o. female presents with the above complaint.  Patient presents with complaint of numbness to bilateral lateral foot.  Patient states it is occasionally happens on and off.  Has progressive gotten worse over the last few days.  He has been going for 6 months is burning pain when walking.  It wakes her up at night as well.  Patient's tried soaking in Epson salt over-the-counter NSAIDs Tylenol none of which has helped.  Right is greater than left side.  Pain scale is 5 out of 10.  She would like to discuss treatment options for numbness tingling.  She denies any other acute complaints.  She does have some history of hip pain.  No back pain.   Review of Systems: Negative except as noted in the HPI. Denies N/V/F/Ch.  Past Medical History:  Diagnosis Date  . CKD (chronic kidney disease), stage IV ([place])   . Hypertension   . Kidney stone   . Recurrent UTI     Current Outpatient Medications:  .  acetaminophen (TYLENOL) 325 MG tablet, Take 650 mg by mouth every 6 (six) hours as needed., Disp: , Rfl:  .  Cetirizine HCl 10 MG CAPS, Take 10 mg by mouth as needed for Allergies, Disp: , Rfl:  .  cyclobenzaprine (FLEXERIL) 10 MG tablet, Take 10 mg by mouth at bedtime as needed., Disp: , Rfl:  .  lisinopril (ZESTRIL) 5 MG tablet, Take by mouth., Disp: , Rfl:  .  montelukast (SINGULAIR) 10 MG tablet, Take 10 mg by mouth as needed for Allergies, Disp: , Rfl:  .  sodium bicarbonate 650 MG tablet, Take 3 tablets (1,950 mg total) by mouth 3 times daily., Disp: , Rfl:   Social History   Tobacco Use  Smoking Status Never Smoker  Smokeless Tobacco Never Used    Allergies  Allergen Reactions  . Amlodipine Shortness Of Breath and Other (See Comments)    Other reaction(s):  Shortness Of Breath Other reaction(s): Shortness Of Breath   . Cephalosporins Nausea And Vomiting and Shortness Of Breath  . Codeine Shortness Of Breath and Nausea And Vomiting  . Hydrochlorothiazide Shortness Of Breath and Other (See Comments)    Other reaction(s): Shortness Of Breath Other reaction(s): Shortness Of Breath   . Latex Itching and Rash    Other reaction(s): Itching Other reaction(s): Itching   . Losartan Potassium Shortness Of Breath and Other (See Comments)    Other reaction(s): Shortness Of Breath Other reaction(s): Shortness Of Breath   . Metoprolol Other (See Comments)  . Doxycycline Hives and Rash    Other reaction(s): Hives Other reaction(s): Hives   . Eggs Or Egg-Derived Products Nausea And Vomiting  . Sucralfate Rash  . Cholecalciferol Other (See Comments)  . Ciprofloxacin Other (See Comments)  . Clonidine Hcl Nausea And Vomiting  . Doxazosin Mesylate Other (See Comments)  . Hydralazine Hcl Other (See Comments)  . Iodine-131 Other (See Comments)    Can not receive due to Kidney disease Can not receive due to Kidney disease   . Ivp Dye [Iodinated Diagnostic Agents]   . Ranitidine Hcl Other (See Comments)  . Sulfa Antibiotics   . Cefuroxime Axetil Nausea And Vomiting  . Penicillin G Rash  welts welts    Objective:  There were no vitals filed for this visit. There is no height or weight on file to calculate BMI. Constitutional Well developed. Well nourished.  Vascular Dorsalis pedis pulses palpable bilaterally. Posterior tibial pulses palpable bilaterally. Capillary refill normal to all digits.  No cyanosis or clubbing noted. Pedal hair growth normal.  Neurologic Normal speech. Oriented to person, place, and time. Epicritic sensation to light touch grossly present bilaterally.  Positive Tinel's sign on the lateral aspect of the foot along the course of the sural nerve.  Dermatologic Nails well groomed and normal in appearance. No open  wounds. No skin lesions.  Orthopedic:  Manual muscle strength 5 out of 5 bilaterally.  No hammertoe contractures noted.  Mild bunion deformity noted.   Radiographs: 3 views of skeletally mature adult bilateral foot: Posterior and plantar heel spurring noted.  No bunion deformity identified.  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  Assessment:   1. Sural neuritis, unspecified laterality    Plan:  Patient was evaluated and treated and all questions answered.  Bilateral sural neuritis -I explained the patient the etiology of neuritis versus treatment options were discussed.  This is likely attributed shoes that are very tight on the outer edge leading to excessive stress on the lateral aspect of the foot therefore putting compression on the nerve leading to burning sensation.  I discussed shoe gear modification with her in extensive detail.  Also while examining her gait it appears that patient does put excessive stress to the lateral aspect of the foot when ambulating secondary to the guarding of the pain or normal gait.  I discussed with her that we may need to do something in the future via orthotics to help improve the gait if needed.  Patient states understanding. Sural neuritis -She would also benefit from neuropathic cream.  I sent a prescription to specialty pharmacy to obtain the cream.  Patient states understanding will apply twice a day to the affected area. No follow-ups on file.

## 2020-07-14 ENCOUNTER — Ambulatory Visit: Payer: Medicare Other | Admitting: Cardiology

## 2020-07-18 ENCOUNTER — Encounter: Payer: Self-pay | Admitting: *Deleted

## 2020-08-16 ENCOUNTER — Encounter: Payer: Self-pay | Admitting: Cardiology

## 2020-08-16 ENCOUNTER — Other Ambulatory Visit: Payer: Self-pay

## 2020-08-16 ENCOUNTER — Ambulatory Visit: Payer: Medicare Other | Admitting: Cardiology

## 2020-08-16 VITALS — BP 168/92 | HR 62 | Ht 60.0 in | Wt 149.0 lb

## 2020-08-16 DIAGNOSIS — R0600 Dyspnea, unspecified: Secondary | ICD-10-CM | POA: Diagnosis not present

## 2020-08-16 DIAGNOSIS — I10 Essential (primary) hypertension: Secondary | ICD-10-CM

## 2020-08-16 DIAGNOSIS — R0609 Other forms of dyspnea: Secondary | ICD-10-CM

## 2020-08-16 DIAGNOSIS — R079 Chest pain, unspecified: Secondary | ICD-10-CM

## 2020-08-16 NOTE — Progress Notes (Signed)
Cardiology Office Note:    Date:  08/16/2020   ID:  Jessica Patterson, DOB 02/12/1960, MRN [id number]  PCP:  Ellamae Sia, MD (Inactive)   [facility] Providers Cardiologist:  Kate Sable, MD     Referring MD: Ellamae Sia, MD   Chief Complaint  Patient presents with   New Patient (Initial Visit)     self referral - hypertension. Patient c.o some chest pain and SOB. Meds reviewed verbally with patient.     History of Present Illness:    Jessica Patterson is a 60 y.o. female with a hx of hypertension, CKD stage IV who presents due to hypertension.  Patient with history of hypertension, CKD.  Has tried several blood pressure medications and stopped due to several side effects.  Also states having shortness of breath with exertion over the past several weeks.  Has occasional chest pain when she takes verapamil.  Recently was on lisinopril but stopped due to elevated potassium.  Would like to come off all BP meds.  States having occasional nausea in the heat with verapamil.  Past Medical History:  Diagnosis Date   CKD (chronic kidney disease), stage IV (HCC)    Hyperlipidemia    Hypertension    Kidney stone    Recurrent UTI    Seasonal allergies     Past Surgical History:  Procedure Laterality Date   ABDOMINAL HERNIA REPAIR N/A    ABDOMINAL HYSTERECTOMY N/A    COLOSTOMY N/A    NEPHROSTOMY TUBE PLACEMENT ([facility] HX) N/A     Current Medications: Current Meds  Medication Sig   acetaminophen (TYLENOL) 325 MG tablet Take 650 mg by mouth every 6 (six) hours as needed.   cetirizine (ZYRTEC) 10 MG tablet Take 10 mg by mouth daily.   cyclobenzaprine (FLEXERIL) 10 MG tablet Take 10 mg by mouth at bedtime as needed.   fluticasone (FLONASE) 50 MCG/ACT nasal spray Place into both nostrils daily as needed for allergies or rhinitis.   montelukast (SINGULAIR) 10 MG tablet Take 10 mg by mouth as needed for Allergies   NONFORMULARY OR COMPOUNDED ITEM See pharmacy note   sodium  bicarbonate 650 MG tablet Take 3 tablets (1,950 mg total) by mouth 3 times daily.   verapamil (CALAN-SR) 120 MG CR tablet Take 120 mg by mouth in the morning and at bedtime.     Allergies:   Amlodipine, Cephalosporins, Codeine, Hydrochlorothiazide, Latex, Losartan potassium, Metoprolol, Doxycycline, Eggs or egg-derived products, Sucralfate, Cholecalciferol, Ciprofloxacin, Clonidine hcl, Doxazosin mesylate, Hydralazine hcl, Iodine-131, Ivp dye [iodinated diagnostic agents], Ranitidine hcl, Sulfa antibiotics, Cefuroxime axetil, and Penicillin g   Social History   Socioeconomic History   Marital status: Single    Spouse name: Not on file   Number of children: Not on file   Years of education: Not on file   Highest education level: Not on file  Occupational History   Not on file  Tobacco Use   Smoking status: Never   Smokeless tobacco: Never  Substance and Sexual Activity   Alcohol use: Not on file   Drug use: Not on file   Sexual activity: Not on file  Other Topics Concern   Not on file  Social History Narrative   Not on file   Social Determinants of Health   Financial Resource Strain: Not on file  Food Insecurity: Not on file  Transportation Needs: Not on file  Physical Activity: Not on file  Stress: Not on file  Social Connections: Not on  file     Family History: The patient's family history includes Lung cancer in her mother; Stroke in her father. There is no history of Breast cancer.  ROS:   Please see the history of present illness.     All other systems reviewed and are negative.  EKGs/Labs/Other Studies Reviewed:    The following studies were reviewed today:   EKG:  EKG is  ordered today.  The ekg ordered today demonstrates normal sinus rhythm, normal ECG  Recent Labs: 09/19/2019: B Natriuretic Peptide 61.9 09/22/2019: ALT 10; BUN 49; Creatinine, Ser 2.14; Hemoglobin 10.7; Platelets 228; Potassium 4.1; Sodium 138  Recent Lipid Panel    Component Value  Date/Time   TRIG 72 09/19/2019 2331     Risk Assessment/Calculations:          Physical Exam:    VS:  BP (!) 168/92 (BP Location: Left Arm, Patient Position: Sitting, Cuff Size: Normal)   Pulse 62   Ht 5' (1.524 m)   Wt 149 lb (67.6 kg)   SpO2 97%   BMI 29.10 kg/m     Wt Readings from Last 3 Encounters:  08/16/20 149 lb (67.6 kg)  09/18/19 144 lb (65.3 kg)  06/24/17 148 lb (67.1 kg)     GEN:  Well nourished, well developed in no acute distress HEENT: Normal NECK: No JVD; No carotid bruits LYMPHATICS: No lymphadenopathy CARDIAC: RRR, no murmurs, rubs, gallops RESPIRATORY:  Clear to auscultation without rales, wheezing or rhonchi  ABDOMEN: Soft, non-tender, non-distended MUSCULOSKELETAL:  No edema; No deformity  SKIN: Warm and dry NEUROLOGIC:  Alert and oriented x 3 PSYCHIATRIC:  Normal affect   ASSESSMENT:    1. Dyspnea on exertion   2. Primary hypertension   3. Chest pain of uncertain etiology    PLAN:    In order of problems listed above:  Dyspnea on exertion, risk factors of hypertension.  Get echo, Lexiscan Myoview to evaluate ischemia. Hypertension, BP elevated.  Recommended increasing dose of verapamil, patient declines, stating she would like to come off all BP meds.  Counseled patient to increase dose of medicine. Nonspecific chest pain with taking verapamil.  Evaluate with echo and stress test as above.  Follow-up after echo and stress testing.   Shared Decision Making/Informed Consent The risks [chest pain, shortness of breath, cardiac arrhythmias, dizziness, blood pressure fluctuations, myocardial infarction, stroke/transient ischemic attack, nausea, vomiting, allergic reaction, radiation exposure, metallic taste sensation and life-threatening complications (estimated to be 1 in 10,000)], benefits (risk stratification, diagnosing coronary artery disease, treatment guidance) and alternatives of a nuclear stress test were discussed in detail with Ms.  Sweney and she agrees to proceed.    Medication Adjustments/Labs and Tests Ordered: Current medicines are reviewed at length with the patient today.  Concerns regarding medicines are outlined above.  Orders Placed This Encounter  Procedures   NM Myocar Multi W/Spect W/Wall Motion / EF   EKG 12-Lead   ECHOCARDIOGRAM COMPLETE    No orders of the defined types were placed in this encounter.   Patient Instructions  Medication Instructions:   Your physician recommends that you continue on your current medications as directed. Please refer to the Current Medication list given to you today.   *If you need a refill on your cardiac medications before your next appointment, please call your pharmacy*   Lab Work: None ordered If you have labs (blood work) drawn today and your tests are completely normal, you will receive your results only by: [place] (if  you have MyChart) OR A paper copy in the mail If you have any lab test that is abnormal or we need to change your treatment, we will call you to review the results.   Testing/Procedures:   Your physician has requested that you have an echocardiogram. Echocardiography is a painless test that uses sound waves to create images of your heart. It provides your doctor with information about the size and shape of your heart and how well your heart's chambers and valves are working. This procedure takes approximately one hour. There are no restrictions for this procedure.  2.  [place]   Your caregiver has ordered a Stress Test with nuclear imaging. The purpose of this test is to evaluate the blood supply to your heart muscle. This procedure is referred to as a "Non-Invasive Stress Test." This is because other than having an IV started in your vein, nothing is inserted or "invades" your body. Cardiac stress tests are done to find areas of poor blood flow to the heart by determining the extent of coronary artery disease (CAD). Some  patients exercise on a treadmill, which naturally increases the blood flow to your heart, while others who are  unable to walk on a treadmill due to physical limitations have a pharmacologic/chemical stress agent called Lexiscan . This medicine will mimic walking on a treadmill by temporarily increasing your coronary blood flow.      PLEASE REPORT TO [facility] MEDICAL MALL ENTRANCE   THE VOLUNTEERS AT THE FIRST DESK WILL DIRECT YOU WHERE TO GO     *Please note: these test may take anywhere between 2-4 hours to complete       Date of Procedure:_____________________________________   Arrival Time for Procedure:______________________________    PLEASE NOTIFY THE OFFICE AT LEAST 24 HOURS IN ADVANCE IF YOU ARE UNABLE TO KEEP YOUR APPOINTMENT.  [place] 24 HOURS IN ADVANCE IF YOU ARE UNABLE TO KEEP YOUR APPOINTMENT. [phone]       How to prepare for your Myoview test:   1. Do not eat or drink after midnight  2. No caffeine for 24 hours prior to test  3. No smoking 24 hours prior to test.  4. Unless instructed otherwise, Take your medication with a small sips of water.    5.         Ladies, please do not wear dresses. Skirts or pants are appropriate. Please wear a short sleeve shirt.  6. No perfume, cologne or lotion.  7. Wear comfortable walking shoes. No heels!     Follow-Up: At [facility], you and your health needs are our priority.  As part of our continuing mission to provide you with exceptional heart care, we have created designated Provider Care Teams.  These Care Teams include your primary Cardiologist (physician) and Advanced Practice Providers (APPs -  Physician Assistants and Nurse Practitioners) who all work together to provide you with the care you need, when you need it.  We recommend signing up for the patient portal called "MyChart".  Sign up information is provided on this After Visit Summary.  MyChart is used to  connect with patients for Virtual Visits (Telemedicine).  Patients are able to view lab/test results, encounter notes, upcoming appointments, etc.  Non-urgent messages can be sent to your provider as well.   To learn more about what you can do with MyChart, go to [email or web address].    Your next appointment:   Follow up after  testing   The format for your next appointment:   In Person  Provider:   Kate Sable, MD   Other Instructions    Signed, Kate Sable, MD  08/16/2020 10:32 AM    [place]

## 2020-08-16 NOTE — Patient Instructions (Signed)
Medication Instructions:   Your physician recommends that you continue on your current medications as directed. Please refer to the Current Medication list given to you today.   *If you need a refill on your cardiac medications before your next appointment, please call your pharmacy*   Lab Work: None ordered If you have labs (blood work) drawn today and your tests are completely normal, you will receive your results only by: [place] (if you have MyChart) OR A paper copy in the mail If you have any lab test that is abnormal or we need to change your treatment, we will call you to review the results.   Testing/Procedures:   Your physician has requested that you have an echocardiogram. Echocardiography is a painless test that uses sound waves to create images of your heart. It provides your doctor with information about the size and shape of your heart and how well your heart's chambers and valves are working. This procedure takes approximately one hour. There are no restrictions for this procedure.  2.  [place]   Your caregiver has ordered a Stress Test with nuclear imaging. The purpose of this test is to evaluate the blood supply to your heart muscle. This procedure is referred to as a "Non-Invasive Stress Test." This is because other than having an IV started in your vein, nothing is inserted or "invades" your body. Cardiac stress tests are done to find areas of poor blood flow to the heart by determining the extent of coronary artery disease (CAD). Some patients exercise on a treadmill, which naturally increases the blood flow to your heart, while others who are  unable to walk on a treadmill due to physical limitations have a pharmacologic/chemical stress agent called Lexiscan . This medicine will mimic walking on a treadmill by temporarily increasing your coronary blood flow.      PLEASE REPORT TO [facility] MEDICAL MALL ENTRANCE   THE VOLUNTEERS AT THE FIRST DESK WILL DIRECT YOU  WHERE TO GO     *Please note: these test may take anywhere between 2-4 hours to complete       Date of Procedure:_____________________________________   Arrival Time for Procedure:______________________________    PLEASE NOTIFY THE OFFICE AT LEAST 24 HOURS IN ADVANCE IF YOU ARE UNABLE TO KEEP YOUR APPOINTMENT.  [place] 24 HOURS IN ADVANCE IF YOU ARE UNABLE TO KEEP YOUR APPOINTMENT. [phone]       How to prepare for your Myoview test:   1. Do not eat or drink after midnight  2. No caffeine for 24 hours prior to test  3. No smoking 24 hours prior to test.  4. Unless instructed otherwise, Take your medication with a small sips of water.    5.         Ladies, please do not wear dresses. Skirts or pants are appropriate. Please wear a short sleeve shirt.  6. No perfume, cologne or lotion.  7. Wear comfortable walking shoes. No heels!     Follow-Up: At [facility], you and your health needs are our priority.  As part of our continuing mission to provide you with exceptional heart care, we have created designated Provider Care Teams.  These Care Teams include your primary Cardiologist (physician) and Advanced Practice Providers (APPs -  Physician Assistants and Nurse Practitioners) who all work together to provide you with the care you need, when you need it.  We recommend signing up for the patient portal  called "MyChart".  Sign up information is provided on this After Visit Summary.  MyChart is used to connect with patients for Virtual Visits (Telemedicine).  Patients are able to view lab/test results, encounter notes, upcoming appointments, etc.  Non-urgent messages can be sent to your provider as well.   To learn more about what you can do with MyChart, go to [email or web address].    Your next appointment:   Follow up after testing   The format for your next appointment:   In Person  Provider:   Kate Sable,  MD   Other Instructions

## 2020-08-18 ENCOUNTER — Telehealth: Payer: Self-pay | Admitting: Cardiology

## 2020-08-18 NOTE — Telephone Encounter (Signed)
Patient requesting to switch from Dr Garen Lah to Dr Rockey Situ for next visit  Per policy both doctors must agree with change Please advise

## 2020-08-22 ENCOUNTER — Telehealth: Payer: Self-pay | Admitting: Cardiology

## 2020-08-22 NOTE — Telephone Encounter (Signed)
Called patient and left a VM for her to call me back.   Patient called back and I informed her that she does not need to hold any medication for her Myoview. She is very nervous about the Myoview, she wanted to know if they still did the treadmill kind. I informed her that this test is more sensitive diagnostically and would yield better results. I explained in detail the process of her Myoview. Patient was grateful for the call back.

## 2020-08-22 NOTE — Telephone Encounter (Signed)
Patient has myoview this week and wants to know if she should hold verapamil

## 2020-08-24 ENCOUNTER — Encounter
Admission: RE | Admit: 2020-08-24 | Discharge: 2020-08-24 | Disposition: A | Discharge status: Home / Self Care | Payer: Medicare Other | Source: Ambulatory Visit | Attending: Cardiology | Admitting: Cardiology

## 2020-08-24 ENCOUNTER — Other Ambulatory Visit: Payer: Self-pay

## 2020-08-24 DIAGNOSIS — R079 Chest pain, unspecified: Secondary | ICD-10-CM | POA: Diagnosis present

## 2020-08-24 LAB — NM MYOCAR MULTI W/SPECT W/WALL MOTION / EF
Estimated workload: 1 METS
Exercise duration (min): 0 min
Exercise duration (sec): 0 s
LV dias vol: 79 mL (ref 46–106)
LV sys vol: 26 mL
MPHR: 160 {beats}/min
Peak HR: 114 {beats}/min
Percent HR: 71 %
Rest HR: 74 {beats}/min
SDS: 0
SRS: 17
SSS: 11
TID: 0.95

## 2020-08-24 MED ORDER — REGADENOSON 0.4 MG/5ML IV SOLN
0.4000 mg | Freq: Once | INTRAVENOUS | Status: AC
  Administered 2020-08-24: 0.4 mg via INTRAVENOUS
  Filled 2020-08-24: qty 5

## 2020-08-24 MED ORDER — TECHNETIUM TC 99M TETROFOSMIN IV KIT
30.0000 | PACK | Freq: Once | INTRAVENOUS | Status: AC | PRN
  Administered 2020-08-24: 32.38 via INTRAVENOUS

## 2020-08-24 MED ORDER — TECHNETIUM TC 99M TETROFOSMIN IV KIT
10.0000 | PACK | Freq: Once | INTRAVENOUS | Status: AC | PRN
  Administered 2020-08-24: 10.08 via INTRAVENOUS

## 2020-08-26 ENCOUNTER — Telehealth: Payer: Self-pay

## 2020-08-26 NOTE — Telephone Encounter (Signed)
Left a Vm for patient to call back so I could give her the following Myoview result note:  Normal stress test, no evidence for ischemia.  Patient plans to follow-up withDr. Rockey Situ.

## 2020-08-26 NOTE — Telephone Encounter (Signed)
The patient has been notified of the result and verbalized understanding.  All questions (if any) were answered. Kavin Leech, RN 08/26/2020 4:33 PM

## 2020-08-26 NOTE — Telephone Encounter (Signed)
Patient returning call.

## 2020-09-29 ENCOUNTER — Ambulatory Visit (INDEPENDENT_AMBULATORY_CARE_PROVIDER_SITE_OTHER): Payer: Medicare Other

## 2020-09-29 ENCOUNTER — Other Ambulatory Visit: Payer: Self-pay

## 2020-09-29 DIAGNOSIS — R0600 Dyspnea, unspecified: Secondary | ICD-10-CM

## 2020-09-29 DIAGNOSIS — R0609 Other forms of dyspnea: Secondary | ICD-10-CM

## 2020-09-29 LAB — ECHOCARDIOGRAM COMPLETE
AR max vel: 2.23 cm2
AV Area VTI: 2.22 cm2
AV Area mean vel: 2.12 cm2
AV Mean grad: 5 mmHg
AV Peak grad: 9.5 mmHg
Ao pk vel: 1.54 m/s
Area-P 1/2: 3.33 cm2
Calc EF: 56.4 %
S' Lateral: 3.4 cm
Single Plane A2C EF: 57.6 %
Single Plane A4C EF: 54 %

## 2020-09-30 ENCOUNTER — Telehealth: Payer: Self-pay

## 2020-09-30 NOTE — Telephone Encounter (Signed)
[place], [place]  09/30/2020  1:20 PM EDT Back to Top    Attempted to contact patient with Echo results.  No answer. LMOV.  Phone note started.    Kate Sable, MD  09/30/2020  9:09 AM EDT     Normal systolic and diastolic function, no findings to suggest etiology of chest pain.

## 2020-10-03 ENCOUNTER — Ambulatory Visit: Payer: Medicare Other | Admitting: Cardiology

## 2020-10-03 NOTE — Telephone Encounter (Signed)
Patient returning call.

## 2020-10-03 NOTE — Telephone Encounter (Signed)
Called patient and gave her the echo result note. Patient was very grateful for the call back.

## 2020-10-03 NOTE — Progress Notes (Deleted)
Cardiology Office Note  Date:  10/03/2020   ID:  Jessica Patterson, DOB [birth year], MRN [id number]  PCP:  Ellamae Sia, MD (Inactive)   No chief complaint on file.   HPI:  Jessica Patterson is a 60 y.o. female with a hx of  hypertension, CKD stage IV who presents due to hypertension. Who presents for routine follow-up of her blood pressure    Recently seen by one of our providers August 16, 2020, reported at that time Had head tried several blood pressure medications and stopped due to several side effects.    shortness of breath with exertion  Has occasional chest pain when she takes verapamil.   on lisinopril but stopped due to elevated potassium.   Wanted to stop all of her blood pressure medications  States having occasional nausea in the heat with verapamil.  Echocardiogram and Myoview was ordered Was recommended she increase the dose of verapamil but she declined  Stress test July 2022, no ischemia Echocardiogram normal ejection fraction, no significant valve disease    PMH:   has a past medical history of CKD (chronic kidney disease), stage IV ([place]), Hyperlipidemia, Hypertension, Kidney stone, Recurrent UTI, and Seasonal allergies.  PSH:    Past Surgical History:  Procedure Laterality Date   ABDOMINAL HERNIA REPAIR N/A    ABDOMINAL HYSTERECTOMY N/A    COLOSTOMY N/A    NEPHROSTOMY TUBE PLACEMENT ([facility] HX) N/A     Current Outpatient Medications  Medication Sig Dispense Refill   acetaminophen (TYLENOL) 325 MG tablet Take 650 mg by mouth every 6 (six) hours as needed.     cetirizine (ZYRTEC) 10 MG tablet Take 10 mg by mouth daily.     cholecalciferol (VITAMIN D3) 25 MCG (1000 UNIT) tablet Take 1,000 Units by mouth daily. (Patient not taking: Reported on 08/16/2020)     cyclobenzaprine (FLEXERIL) 10 MG tablet Take 10 mg by mouth at bedtime as needed.     fluticasone (FLONASE) 50 MCG/ACT nasal spray Place into both nostrils daily as needed for allergies or rhinitis.      fosfomycin (MONUROL) 3 g PACK Take 3 g by mouth every 3 (three) days. As directed (Patient not taking: Reported on 08/16/2020)     lisinopril (ZESTRIL) 10 MG tablet Take 1 tablet by mouth daily. (Patient not taking: Reported on 08/16/2020)     montelukast (SINGULAIR) 10 MG tablet Take 10 mg by mouth as needed for Allergies     NONFORMULARY OR COMPOUNDED ITEM See pharmacy note 120 each 0   sodium bicarbonate 650 MG tablet Take 3 tablets (1,950 mg total) by mouth 3 times daily.     verapamil (CALAN-SR) 120 MG CR tablet Take 120 mg by mouth in the morning and at bedtime.     No current facility-administered medications for this visit.     Allergies:   Amlodipine, Cephalosporins, Codeine, Hydrochlorothiazide, Latex, Losartan potassium, Metoprolol, Doxycycline, Eggs or egg-derived products, Sucralfate, Cholecalciferol, Ciprofloxacin, Clonidine hcl, Doxazosin mesylate, Hydralazine hcl, Iodine-131, Ivp dye [iodinated diagnostic agents], Ranitidine hcl, Sulfa antibiotics, Cefuroxime axetil, and Penicillin g   Social History:  The patient  reports that she has never smoked. She has never used smokeless tobacco.   Family History:   family history includes Lung cancer in her mother; Stroke in her father.    Review of Systems: ROS   PHYSICAL EXAM: VS:  There were no vitals taken for this visit. , BMI There is no height or weight on file to calculate BMI. GEN:  Well nourished, well developed, in no acute distress HEENT: normal Neck: no JVD, carotid bruits, or masses Cardiac: RRR; no murmurs, rubs, or gallops,no edema  Respiratory:  clear to auscultation bilaterally, normal work of breathing GI: soft, nontender, nondistended, + BS MS: no deformity or atrophy Skin: warm and dry, no rash Neuro:  Strength and sensation are intact Psych: euthymic mood, full affect    Recent Labs: No results found for requested labs within last 8760 hours.    Lipid Panel Lab Results  Component Value Date   TRIG  72 09/19/2019      Wt Readings from Last 3 Encounters:  08/16/20 149 lb (67.6 kg)  09/18/19 144 lb (65.3 kg)  06/24/17 148 lb (67.1 kg)       ASSESSMENT AND PLAN:  Problem List Items Addressed This Visit       Cardiology Problems   Hypertension   Other Visit Diagnoses     Dyspnea on exertion    -  Primary   Chest pain of uncertain etiology            Disposition:   F/U  12 months   Total encounter time more than 25 minutes  Greater than 50% was spent in counseling and coordination of care with the patient    Signed, Esmond Plants, M.D., Ph.D. [place], [place]

## 2020-10-04 ENCOUNTER — Ambulatory Visit: Payer: Medicare Other | Admitting: Cardiovascular Disease

## 2021-04-13 ENCOUNTER — Other Ambulatory Visit: Payer: Self-pay | Admitting: Internal Medicine

## 2021-04-13 DIAGNOSIS — Z1231 Encounter for screening mammogram for malignant neoplasm of breast: Secondary | ICD-10-CM

## 2021-05-22 ENCOUNTER — Ambulatory Visit
Admission: RE | Admit: 2021-05-22 | Discharge: 2021-05-22 | Disposition: A | Discharge status: Home / Self Care | Payer: Medicare Other | Source: Ambulatory Visit | Attending: Internal Medicine | Admitting: Internal Medicine

## 2021-05-22 DIAGNOSIS — Z1231 Encounter for screening mammogram for malignant neoplasm of breast: Secondary | ICD-10-CM | POA: Diagnosis present

## 2021-06-08 ENCOUNTER — Ambulatory Visit: Payer: Medicare Other | Admitting: Podiatry

## 2021-06-08 ENCOUNTER — Ambulatory Visit (INDEPENDENT_AMBULATORY_CARE_PROVIDER_SITE_OTHER): Payer: Medicare Other

## 2021-06-08 ENCOUNTER — Encounter: Payer: Self-pay | Admitting: Podiatry

## 2021-06-08 DIAGNOSIS — L989 Disorder of the skin and subcutaneous tissue, unspecified: Secondary | ICD-10-CM | POA: Diagnosis not present

## 2021-06-08 NOTE — Progress Notes (Signed)
?Subjective:  ?Patient ID: Jessica Patterson, female    DOB: 04-30-1960,  MRN: [id number] ? ?No chief complaint on file. ? ? ?61 y.o. female presents with the above complaint.  Patient presents with complaint of left fifth digit benign skin lesion.  Patient states painful to touch painful to walk on.  She would like to have it removed.  She has not seen anyone else prior to seeing me.  She denies any other acute complaints.  Hurts with pressure and ambulation pain scale is 8 out of 10. ? ? ?Review of Systems: Negative except as noted in the HPI. Denies N/V/F/Ch. ? ?Past Medical History:  ?Diagnosis Date  ? CKD (chronic kidney disease), stage IV ([place])   ? Hyperlipidemia   ? Hypertension   ? Kidney stone   ? Recurrent UTI   ? Seasonal allergies   ? ? ?Current Outpatient Medications:  ?  acetaminophen (TYLENOL) 325 MG tablet, Take 650 mg by mouth every 6 (six) hours as needed., Disp: , Rfl:  ?  cetirizine (ZYRTEC) 10 MG tablet, Take 10 mg by mouth daily., Disp: , Rfl:  ?  cholecalciferol (VITAMIN D3) 25 MCG (1000 UNIT) tablet, Take 1,000 Units by mouth daily. (Patient not taking: Reported on 08/16/2020), Disp: , Rfl:  ?  cyclobenzaprine (FLEXERIL) 10 MG tablet, Take 10 mg by mouth at bedtime as needed., Disp: , Rfl:  ?  fluticasone (FLONASE) 50 MCG/ACT nasal spray, Place into both nostrils daily as needed for allergies or rhinitis., Disp: , Rfl:  ?  fosfomycin (MONUROL) 3 g PACK, Take 3 g by mouth every 3 (three) days. As directed (Patient not taking: Reported on 08/16/2020), Disp: , Rfl:  ?  lisinopril (ZESTRIL) 10 MG tablet, Take 1 tablet by mouth daily. (Patient not taking: Reported on 08/16/2020), Disp: , Rfl:  ?  montelukast (SINGULAIR) 10 MG tablet, Take 10 mg by mouth as needed for Allergies, Disp: , Rfl:  ?  NONFORMULARY OR COMPOUNDED ITEM, See pharmacy note, Disp: 120 each, Rfl: 0 ?  sodium bicarbonate 650 MG tablet, Take 3 tablets (1,950 mg total) by mouth 3 times daily., Disp: , Rfl:  ?  verapamil (CALAN-SR) 120  MG CR tablet, Take 120 mg by mouth in the morning and at bedtime., Disp: , Rfl:  ? ?Social History  ? ?Tobacco Use  ?Smoking Status Never  ?Smokeless Tobacco Never  ? ? ?Allergies  ?Allergen Reactions  ? Amlodipine Shortness Of Breath and Other (See Comments)  ?  Other reaction(s): Shortness Of Breath ?Other reaction(s): Shortness Of Breath ?  ? Cephalosporins Nausea And Vomiting and Shortness Of Breath  ? Codeine Shortness Of Breath and Nausea And Vomiting  ? Hydrochlorothiazide Shortness Of Breath and Other (See Comments)  ?  Other reaction(s): Shortness Of Breath ?Other reaction(s): Shortness Of Breath ?  ? Latex Itching and Rash  ?  Other reaction(s): Itching ?Other reaction(s): Itching ?  ? Losartan Potassium Shortness Of Breath and Other (See Comments)  ?  Other reaction(s): Shortness Of Breath ?Other reaction(s): Shortness Of Breath ?  ? Metoprolol Other (See Comments)  ? Doxycycline Hives and Rash  ?  Other reaction(s): Hives ?Other reaction(s): Hives ?  ? Eggs Or Egg-Derived Products Nausea And Vomiting  ? Sucralfate Rash  ? Cholecalciferol Other (See Comments)  ? Ciprofloxacin Other (See Comments)  ? Clonidine Hcl Nausea And Vomiting  ? Doxazosin Mesylate Other (See Comments)  ? Hydralazine Hcl Other (See Comments)  ? Iodine-131 Other (See Comments)  ?  Can not receive due to Kidney disease ?Can not receive due to Kidney disease ?  ? Ivp Dye [Iodinated Contrast Media]   ? Ranitidine Hcl Other (See Comments)  ? Sulfa Antibiotics   ? Cefuroxime Axetil Nausea And Vomiting  ? Penicillin G Rash  ?  welts ?welts ?  ? ?Objective:  ?There were no vitals filed for this visit. ?There is no height or weight on file to calculate BMI. ?Constitutional Well developed. ?Well nourished.  ?Vascular Dorsalis pedis pulses palpable bilaterally. ?Posterior tibial pulses palpable bilaterally. ?Capillary refill normal to all digits.  ?No cyanosis or clubbing noted. ?Pedal hair growth normal.  ?Neurologic Normal speech. ?Oriented  to person, place, and time. ?Epicritic sensation to light touch grossly present bilaterally.  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  Pain on palpation to the benign skin lesion.  ?Orthopedic: Normal joint ROM without pain or crepitus bilaterally. ?No visible deformities. ?No bony tenderness.  ? ?Radiographs: None ?Assessment:  ? ?1. Benign skin lesion   ? ?Plan:  ?Patient was evaluated and treated and all questions answered. ? ?Left submetatarsal 5 benign skin lesion/porokeratosis ?-All questions and concerns were discussed with the patient in extensive detail.  Given the amount of pain that she is having she will benefit from punch excision of the lesion.  She states understanding like to proceed with that ?-Excision of the skin lesion ?Skin was prepped in standard technique with Betadine.  One-to-one mixture of 1% lidocaine plain half percent Marcaine plain was injected circumferentially in V-block fashion 3 cc.  3 mm punch biopsy was utilized to remove the lesion in its entirety down to the level of the subcutaneous tissue.  The wound site was dressed with triple antibiotic 4 x 4 gauze Kerlix and Ace bandage.  I have asked the patient to keep it covered with Neosporin and a Band-Aid until healing.  I will see her back again in 6 weeks ? ? ?No follow-ups on file. ?

## 2021-06-20 NOTE — Progress Notes (Deleted)
Cardiology Office Note  Date:  06/20/2021   ID:  Jessica Patterson, DOB Sep 08, 1960, MRN [id number]  PCP:  Verlin Dike, MD   No chief complaint on file.   HPI:  [name] is a 61 year old woman with past medical history of Hypertension, Chronic kidney disease Referred for shortness of breath and chest pain  Previously seen by one of our partners August 16, 2020  Previous medication intolerances Hyperkalemia on lisinopril Occasional chest pain on verapamil  Myoview was ordered performed August 24, 2020 Low risk study, no coronary calcification  Lab work reviewed Creatinine 2.6 BUN 36 potassium 5.0  PMH:   has a past medical history of CKD (chronic kidney disease), stage IV ([place]), Hyperlipidemia, Hypertension, Kidney stone, Recurrent UTI, and Seasonal allergies.  PSH:    Past Surgical History:  Procedure Laterality Date   ABDOMINAL HERNIA REPAIR N/A    ABDOMINAL HYSTERECTOMY N/A    COLOSTOMY N/A    NEPHROSTOMY TUBE PLACEMENT ([facility] HX) N/A     Current Outpatient Medications  Medication Sig Dispense Refill   acetaminophen (TYLENOL) 325 MG tablet Take 650 mg by mouth every 6 (six) hours as needed.     cetirizine (ZYRTEC) 10 MG tablet Take 10 mg by mouth daily.     cholecalciferol (VITAMIN D3) 25 MCG (1000 UNIT) tablet Take 1,000 Units by mouth daily. (Patient not taking: Reported on 08/16/2020)     cyclobenzaprine (FLEXERIL) 10 MG tablet Take 10 mg by mouth at bedtime as needed.     fluticasone (FLONASE) 50 MCG/ACT nasal spray Place into both nostrils daily as needed for allergies or rhinitis.     fosfomycin (MONUROL) 3 g PACK Take 3 g by mouth every 3 (three) days. As directed (Patient not taking: Reported on 08/16/2020)     lisinopril (ZESTRIL) 10 MG tablet Take 1 tablet by mouth daily. (Patient not taking: Reported on 08/16/2020)     montelukast (SINGULAIR) 10 MG tablet Take 10 mg by mouth as needed for Allergies     NONFORMULARY OR COMPOUNDED ITEM See pharmacy note  120 each 0   sodium bicarbonate 650 MG tablet Take 3 tablets (1,950 mg total) by mouth 3 times daily.     verapamil (CALAN-SR) 120 MG CR tablet Take 120 mg by mouth in the morning and at bedtime.     No current facility-administered medications for this visit.     Allergies:   Amlodipine, Cephalosporins, Codeine, Hydrochlorothiazide, Latex, Losartan potassium, Metoprolol, Doxycycline, Eggs or egg-derived products, Sucralfate, Cholecalciferol, Ciprofloxacin, Clonidine hcl, Doxazosin mesylate, Hydralazine hcl, Iodine-131, Ivp dye [iodinated contrast media], Ranitidine hcl, Sulfa antibiotics, Cefuroxime axetil, and Penicillin g   Social History:  The patient  reports that she has never smoked. She has never used smokeless tobacco.   Family History:   family history includes Lung cancer in her mother; Stroke in her father.    Review of Systems: ROS   PHYSICAL EXAM: VS:  There were no vitals taken for this visit. , BMI There is no height or weight on file to calculate BMI. GEN: Well nourished, well developed, in no acute distress HEENT: normal Neck: no JVD, carotid bruits, or masses Cardiac: RRR; no murmurs, rubs, or gallops,no edema  Respiratory:  clear to auscultation bilaterally, normal work of breathing GI: soft, nontender, nondistended, + BS MS: no deformity or atrophy Skin: warm and dry, no rash Neuro:  Strength and sensation are intact Psych: euthymic mood, full affect    Recent Labs: No results found for  requested labs within last 8760 hours.    Lipid Panel Lab Results  Component Value Date   TRIG 72 09/19/2019      Wt Readings from Last 3 Encounters:  08/16/20 149 lb (67.6 kg)  09/18/19 144 lb (65.3 kg)  06/24/17 148 lb (67.1 kg)       ASSESSMENT AND PLAN:  Problem List Items Addressed This Visit   None    Disposition:   F/U  12 months   Total encounter time more than 30 minutes  Greater than 50% was spent in counseling and coordination of care with  the patient    Signed, Esmond Plants, M.D., Ph.D. [place], [place]

## 2021-06-21 ENCOUNTER — Ambulatory Visit: Payer: Medicare Other | Admitting: Cardiovascular Disease

## 2022-02-06 ENCOUNTER — Telehealth: Payer: Self-pay | Admitting: Cardiology

## 2022-02-06 NOTE — Telephone Encounter (Signed)
Left a message for the patient to call back.  

## 2022-02-06 NOTE — Telephone Encounter (Signed)
Results left up front for the patient

## 2022-02-06 NOTE — Telephone Encounter (Signed)
Patient returned call

## 2022-02-06 NOTE — Telephone Encounter (Signed)
Patient is requesting a copy of Stress test done on 08/24/20 for a up coming kidney transplant class 02/12/22. Please reply asap with copies by Friday

## 2022-05-15 ENCOUNTER — Other Ambulatory Visit: Payer: Self-pay | Admitting: Family Medicine

## 2022-05-15 DIAGNOSIS — Z1231 Encounter for screening mammogram for malignant neoplasm of breast: Secondary | ICD-10-CM

## 2022-06-13 ENCOUNTER — Ambulatory Visit
Admission: RE | Admit: 2022-06-13 | Discharge: 2022-06-13 | Disposition: A | Discharge status: Home / Self Care | Payer: Medicare Other | Source: Ambulatory Visit | Attending: Family Medicine | Admitting: Family Medicine

## 2022-06-13 DIAGNOSIS — Z1231 Encounter for screening mammogram for malignant neoplasm of breast: Secondary | ICD-10-CM | POA: Insufficient documentation

## 2023-02-10 NOTE — Progress Notes (Addendum)
 NEPHROLOGY CONSULT: Acute Kidney Injury/Acute Renal Failure  Date of service: 02/10/2023  REQUESTING PHYSICIAN: Delon Thi Quinten Armor,* PRIMARY CARE PHYSICIAN: Tawni Chandra Bramble, MD  REASON FOR CONSULTATION: Provide recommendations regarding elevated serum creatinine  Interval History Tolerated 2 hr HD well yesterday (1st HD session)  1L UF. 1.5 UOP BP uncontrolled She will try taking hydralazine  50 mg po tid   HISTORY OF PRESENT ILLNESS: Jessica Patterson is a 63 y.o. 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 She had been advised to go to ED due outpatient labs showing AKI on CKD w/ hyperkalemia. Her BL Cr has been progressively increasing over the last year from 2.7 to 3.4. On  admission, her Cr was 4.52 which increased to 4.93 over the ensuing 24 hour interval. She also had recurrent hyperkalemia despite potassium binder and temporization as well as metabolic acidosis w/ bicarb to 13. UOP 750 mL yesterday.   On renal US  patient has atrophic L kidney and moderate right hydronephrosis w/ increased echogenecity. Follow CT without hydronephrosis and bilateral cortical scarring.  Given the patient's recurrent hyperkalemia, acidosis, rising creatinine on eGFR baseline of 15 as well as evidence of bilateral tissue scarring, hemodialysis was recommended to the patient, to which the patient consented.   Potential nephrotoxic exposures: Hypotension/hemodynamic instability:  []  yes;  []  no.  Hypertensive emergency NSAIDS:  []  yes;  [x]  no.  Iodinated contrast exposure:  []  yes;  [x]  no. Nephrotoxic antimicrobials:  []  yes;  [x]  no. Other nephrotoxic meds:  []  yes;  [x]  no.  PAST MEDICAL  HISTORY: Principal Problem:   Hypertensive emergency  Past Medical History:  Diagnosis Date  . Allergy   . Anemia   . Antibiotic-associated diarrhea   . Bladder extrophy   . Calcium urolithiasis   . Chronic kidney disease   . GERD (gastroesophageal reflux disease)   . Hemorrhoids   . Hyperkalemia, diminished renal excretion   . Hypertension   . Incisional hernia   . Kidney stone   . Metabolic acidosis   . Other specified disorders resulting from impaired renal function   . Parastomal hernia   . PONV (postoperative nausea and vomiting)   . Renal cyst, acquired   . Renal mass   . Ureteral stone   . UTI (urinary tract infection)    Past Surgical History:  Procedure Laterality Date  . ABDOMINAL SURGERY      Procedure: ABDOMINAL SURGERY  . CERVICAL CONE BIOPSY N/A 05/26/2013   Procedure: CONIZATION OF CERVIX  D&C;  Surgeon: Ozell Cathlyn Sor, MD;  Location: [facility] MAIN OR;  Service: Gynecology;  Laterality: N/A;  . COLON SURGERY     Procedure: COLON SURGERY  . COLOSTOMY     Procedure: COLOSTOMY  . COLPOSCOPY N/A 07/19/2015   Procedure: COLPOSCOPY with CERVICAL BIOPSIES;  Surgeon: Ozell Cathlyn Sor, MD;  Location: [facility] MAIN OR;  Service: Gynecology;  Laterality: N/A;  . DIAGNOSTIC LAPAROSCOPY N/A 09/18/2017   Procedure: Laparoscopic  ABDOMINAL WALL MESH EXCISION;  Surgeon: Garnette Jayson Ginger, MD;  Location: [facility] MAIN OR;  Service: General;  Laterality: N/A;  . HERNIA REPAIR     Procedure: HERNIA REPAIR  . INCISIONAL HERNIA REPAIR Left 07/12/2017   Procedure: INCISIONAL HERNIA REPAIR ROBOTIC X  2;  Surgeon: Garnette Jayson Ginger, MD;  Location: [facility] MAIN OR;  Service: General;  Laterality: Left;  TAPP blocks plese  . KIDNEY STONE SURGERY     Procedure: KIDNEY STONE SURGERY  . SMALL INTESTINE SURGERY     Procedure: SMALL INTESTINE SURGERY; x 3  . TOTAL ABDOMINAL HYSTERECTOMY N/A 12/23/2015   Procedure: HYSTERECTOMY TOTAL ABDOMINAL/BSO ADHESIOLYSIS  ** CO-SURGEON:  DR. LAMAR SAR;   Surgeon: Ozell Cathlyn Sor, MD;  Location: [facility] MAIN OR;  Service: Gynecology;  Laterality: N/A;  . URETEROSIGMOIDOSTOMY     Procedure: URETEROSIGMOIDOSTOMY    ALLERGIES: Allergies  Allergen Reactions  . Amlodipine Shortness Of Breath    Other reaction(s): Shortness Of Breath  . Hydralazide Rash  . Hydrochlorothiazide Shortness Of Breath    Other reaction(s): Shortness Of Breath  . Hydromorphone Hives and GI Intolerance  . Latex Rash    Other reaction(s): Itching  . Losartan  Potassium Shortness Of Breath    Other reaction(s): Shortness Of Breath  . Metoprolol Fumarate Other (See Comments)    Reaction: Chills (intolerance)   . Sulfamethoxazole Anaphylaxis, Itching and Swelling    Other reaction(s): Itching  . Doxycycline Hives    Other reaction(s): Hives  . Sucralfate Rash  . Vitamin D  (With Calcium) Itching  . Cholecalciferol (Vitamin D3) Itching  . Ciprofloxacin Other (See Comments)    Reaction: Tremor (intolerance)   . Clonidine GI Intolerance  . Diltiazem Hcl GI Intolerance  . Doxazosin Other (See Comments)    Dizziness and numbness  . Hydralazine  Hcl Other (See Comments)  . Iodinated Contrast Media Other (See Comments)    Can not take due to kidney dx  . Iodine-131 Other (See Comments)    Can not receive due to Kidney disease  . Isosorbide  GI Intolerance  . Morphine Hives and GI Intolerance  . Nifedipine Other (See Comments)    CHEST PAIN ACHING  . Prazosin Other (See Comments)    Headaches  . Ranitidine Hcl Other (See Comments)  . Calcium Channel Blocking Agent Diltiazem Analogues Diarrhea  . Cefuroxime Axetil GI Intolerance  . Penicillin G Rash    welts    HOME MEDICATIONS: Prior to Admission medications   Medication Sig Start Date End Date Taking? Authorizing Provider  cetirizine  (ZyrTEC ) 10 mg tablet Take 5 mg by mouth See Admin Instructions. Take 1/2 tab every other day as needed for allergies 10/01/16  Yes HISTORICAL PROVIDER, [facility]   ergocalciferol  (VITAMIN D2) 1,250 mcg (50,000 unit) capsule TAKE 1 CAPSULE BY MOUTH ONCE EVERY MONTH Patient taking differently: Take 50,000 Units by mouth every 30 (thirty) days. TAKE 1 CAPSULE BY MOUTH ONCE EVERY MONTH 10/24/22  Yes Ricardo KATHEE Fenton Mickey., MD  lisinopriL (PRINIVIL) 5 mg tablet Take 1 tablet (5 mg total) by mouth every other day. Patient taking differently: Take 5 mg by mouth daily. 08/08/22  Yes Ricardo KATHEE Fenton Mickey., MD  sodium bicarbonate  650 mg tablet TAKE 3 TABLETS BY MOUTH THREE TIMES DAILY Patient taking differently: Take 1,950 mg by mouth 3 (three) times a day. 11/13/22  Yes Ricardo KATHEE Fenton Mickey., MD  acetaminophen  (TYLENOL ) 325 mg tablet Take 650 mg by mouth every 4 (four) hours as needed (pain). 07/03/17   HISTORICAL PROVIDER, [facility]  apixaban (ELIQUIS) 5 mg tab Take 1 tablet by mouth 2 (two) times a day. Patient not taking: Reported on 02/08/2023 08/31/22   HISTORICAL PROVIDER, [facility]  cyclobenzaprine  (FLEXERIL ) 10 mg tablet Take 10 mg by mouth daily as needed  for muscle spasms. 09/16/19   HISTORICAL PROVIDER, [facility]  fluticasone  propionate (FLONASE ) 50 mcg/spray nasal spray Administer 1 spray into each nostril daily as needed for allergies. 12/15/20   HISTORICAL PROVIDER, [facility]  minoxidiL (LONITEN) 2.5 mg tablet Take 1 tablet (2.5 mg total) by mouth daily. Patient not taking: Reported on 02/08/2023 12/31/22   Ricardo KATHEE Fenton Mickey., MD  montelukast  (SINGULAIR ) 10 mg tablet Take 10 mg by mouth daily as needed (allergies). 11/09/16   HISTORICAL PROVIDER, [facility]    CURRENT MEDICATIONS: heparin  (porcine), 5,000 Units, subcutaneous, Q8H SCH labetaloL , 300 mg, oral, Q12H SCH mupirocin, 1 Application, Each Nostril, BID sodium bicarbonate , 1,300 mg, oral, TID sodium zirconium cyclosilicate , 10 g, oral, TID  Followed by sodium zirconium cyclosilicate , 10 g, oral, Daily    FAMILY HISTORY: Family History  Problem Relation Name Age of Onset  . Cancer  Mother         lung  . Hyperlipidemia Mother    . Stroke Father    . Hypertension Father    . Atrial fibrillation Father    . Cancer Maternal Aunt    . Heart disease Maternal Grandmother    . Heart disease Maternal Grandfather    . Cancer Paternal Grandfather         skin    SOCIAL HISTORY: Social History   Socioeconomic History  . Marital status: Single    Spouse name: None  . Number of children: None  . Years of education: None  . Highest education level: None  Occupational History  . None  Tobacco Use  . Smoking status: Never  . Smokeless tobacco: Never  Substance and Sexual Activity  . Alcohol use: No  . Drug use: No  . Sexual activity: None  Other Topics Concern  . None  Social History Narrative  . None   Social Drivers of Health   Food Insecurity: Low Risk  (10/19/2022)   Food vital sign   . Within the past 12 months, you worried that your food would run out before you got money to buy more: Never true   . Within the past 12 months, the food you bought just didn't last and you didn't have money to get more: Never true  Transportation Needs: No Transportation Needs (10/19/2022)   Transportation   . In the past 12 months, has lack of reliable transportation kept you from medical appointments, meetings, work or from getting things needed for daily living? : No  Safety: Low Risk  (10/19/2022)   Safety   . How often does anyone, including family and friends, physically hurt you?: Never   . How often does anyone, including family and friends, insult or talk down to you?: Never   . How often does anyone, including family and friends, threaten you with harm?: Never   . How often does anyone, including family and friends, scream or curse at you?: Never  Living Situation: Low Risk  (10/19/2022)   Living Situation   . What is your living situation today?: I have a steady place to live   . Think about the place you live. Do you have problems with any of the following?  Choose all that apply:: None/None on this list     REVIEW OF SYSTEMS:  All systems reviewed and are negative except those detailed in the above HPI.  PHYSICAL EXAMINATION: Vitals:   02/10/23 0645 02/10/23 0700 02/10/23 0730 02/10/23 0800  BP: (!) 196/102 (!) 187/104 (!) 195/101 97/67  Pulse: 73 78 73 72  Resp: (!) 11 15 16 14   Temp: 96 F (35.6 C) 95.3 F (35.2 C) 95.7 F (35.4 C) (!) 94.8 F (34.9 C)  TempSrc:      SpO2: 94% 97% 97% 97%  Weight:      Height:       Constitutional: Alert, no apparent distress Cardiovascular: Regular rhythm, normal s1/s2 heart sounds, no murmurs, no rubs, trace edema Pulmonary: clear on auscultation, normal respiratory effort Gastrointestinal: Active bowel sounds. Abdomen soft, non-tender, non-distended. Musculoskeletal: No active synovitis, no joint deformities Skin: no rash, no induration on extremities Psychiatric: Normal eye contact and language, affect is appropriate Dialysis Access: RIJ VC placed in ICU 02/08/2022  LABS: Recent Labs    02/08/23 1315 02/08/23 1631 02/08/23 1803 02/09/23 0042 02/09/23 0527 02/09/23 1241 02/09/23 1803 02/10/23 0047 02/10/23 0635  NA 136 137   < > 136   < > 134* 136 135* 133*  K 6.2* 4.9   < > 5.6*   < > 4.8 4.5 4.4 5.0  CL 112* 114*   < > 115*   < > 110* 111* 107 104  CO2 15* 16*   < > 15*   < > 15* 20* 24 23  BUN 42* 42*   < > 41*   < > 41* 40* 28* 30*  CREATININE 4.52* 4.42*   < > 4.62*   < > 4.69* 4.93* 3.88* 4.18*  CALCIUM 8.1* 8.3*   < > 8.2*   < > 8.3* 8.3* 8.3* 7.9*  PHOS  --  4.2  --  4.7  --   --   --  4.5  --   ALBUMIN  --  3.2*  --  3.2*  --   --   --  2.9*  --   MG  --  1.7*  --  1.6*  --   --   --  2.0  --   WBC 6.40 6.70  --  6.20  --   --   --  6.20  --   HGB 9.3* 8.0*  --  8.2*  --   --   --  7.5*  --   PLT 204 185  --  201  --   --   --  154  --    < > = values in this interval not displayed.   Albumin  Date Value Ref Range Status  02/10/2023 2.9 (L) 3.5 - 5.7 g/dL Final     Comment:    MODERATE HEMOLYSIS   External Albumin  Date Value Ref Range Status  02/01/2023 3.5 g/dL Final   No results found for: BNP  PERTINENT WORKUP: Radiology Results (last 72 hours)     Procedure Component Value Units Date/Time   XR Chest 1 View [[id number]] Collected: 02/09/23 1612   Order Status: Completed Updated: 02/09/23 1921   Narrative:     XR CHEST 1 VIEW, 02/09/2023 3:18 PM  INDICATION:central line placement  COMPARISON: Chest radiograph 03/07/2022  FINDINGS:   Supportive devices: Right IJ approach central venous catheter with tip projecting over the distal SVC. EKG leads overlie the chest. Cardiovascular/lungs/pleura: Cardiac silhouette and pulmonary vasculature are within normal limits. Diffuse interstitial opacities. No pleural effusion or pneumothorax.  Other: Visualized upper abdomen is unremarkable. No acute displaced fracture. Polyarticular degenerative changes.    Impression:     Right IJ approach central venous catheter with tip projecting over the distal SVC. No pneumothorax.    CT [employer]  Search WO Contrast [[id number]] Collected: 02/09/23 1101   Order Status: Completed Updated: 02/09/23 1418   Narrative:     CT STONE SEARCH WO CONTRAST, 02/09/2023 10:52 AM  INDICATION: Nephrolithiasis  COMPARISON: Multiple prior CTs, most recent 02/25/2022.  TECHNIQUE: CT images of the abdomen and pelvis were obtained without the use of intravenous contrast. Conventional axial reconstructions and multiplanar reformatted images were submitted for review.   LIMITATIONS: Evaluation of various soft tissue structures as well as the vasculature is limited without the availability of intravenous contrast. Parameters utilized in some study protocols to mitigate patient radiation exposure can also reduce sensitivity and specificity of assessment.  FINDINGS:  . Lower Chest: Bilateral subsegmental atelectasis.  . Liver: No suspicious focal findings. . Gallbladder/Biliary:  Partially decompressed. No intrahepatic or extrahepatic biliary duct dilation. [name] Spleen: Unremarkable. . Pancreas: Unremarkable. . Adrenals: Unremarkable. . Kidneys/ureters: Similar 2.5 cm right upper pole and lower pole hypointensities, likely benign cysts. Similar left lower pole 1.6 cm hypodensity, likely benign cysts. Similar 1 cm left upper pole nonobstructing renal calculus. Redemonstrated bilateral renal cortical scarring and associated parenchymal volume loss. No hydronephrosis. Prior ureteral diversion.  . Peritoneum/Mesenteries/Extraperitoneum: No free air. No free fluid or loculated drainable collection. No pathologically enlarged lymph nodes. . Gastrointestinal tract: Likely alimentary residue in the stomach. Redemonstrated descending colostomy. Similar ventral midline infraumbilical abdominal wall hernia with multiple loops was small bowel, no evidence of obstruction. Colonic diverticulosis, without diverticulitis. No evidence of obstruction.  . Bladder: Redemonstrated colonic urinary diversion the pelvis. . Reproductive System: Status post hysterectomy and bilateral salpingo-oophorectomy.  . Vascular: Mild aortobiiliac atherosclerosis. . Musculoskeletal: Redemonstrated diastasis of the symphysis pubis consistent with history of bladder exstrophy. Similar T9 and L5 hemangiomas. Multilevel degenerative changes of the spine. No acute displaced fractures. No aggressive focal bony lesions. 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    Impression:     1.  Redemonstrated left-sided nonobstructing renal calculus. No hydronephrosis bilaterally. 2.  Chronic/stable ancillary findings as above.   US  Renal Bilateral Complete [[id number]] Collected: 02/08/23 1858   Order Status: Completed Updated: 02/08/23 2100   Narrative:     US  RENAL BILATERAL COMPLETE, 02/08/2023 6:48 PM  INDICATION: new onset renal failure  COMPARISON: CT abdomen/pelvis 03/07/2022  TECHNIQUE: Multi-planar,  real-time ultrasonography of the retroperitoneum and urinary tract using grayscale imaging was performed, supplemented by color and/or power Doppler as needed.  FINDINGS:  .  Right Kidney: Length = 10.1 cm. Moderate hydronephrosis. Diffuse increased echogenicity. Simple appearing cysts, the largest measuring 2.9 cm.  .  Left Kidney: Visualization of the left kidney is markedly limited. Length = approximately 7.1 cm. Atrophic with diffusely increased echogenicity. No definite hydronephrosis. Simple appearing cyst measuring 1.2 cm . [name]  Bladder: Status post colonic urinary diversion. .  Additional comments: None.    Impression:     1.  Moderate right hydronephrosis. 2.  Diffusely increased echogenicity of the right kidney, which can be seen in medical renal disease. 3.  Markedly limited evaluation of the atrophic left kidney.      CT Head WO Contrast [[id number]] Collected: 02/08/23 1633   Order Status: Completed Updated: 02/08/23 1705   Narrative:     CT HEAD WITHOUT CONTRAST, 02/08/2023 4:29 PM  INDICATION: Headache, new or worsening (Age >= 50y)   COMPARISON: None  TECHNIQUE: Axial CT images of the brain from skull base to vertex, including portions of the face and sinuses, were obtained without contrast. Supplemental 2D reformatted images were generated and reviewed as  needed.  All CT scans at [facility] and [facility] are performed using radiation dose optimization techniques as appropriate to a performed exam, including but not limited to one or more of the following: automatic exposure control, adjustment of the mA and/or kV according to patient size, use of iterative reconstruction technique. In addition, our institution participates in a radiation dose monitoring program to optimize patient radiation exposure.   FINDINGS:  Calvarium/skull base: No evidence of acute fracture or destructive lesion. Mastoids and middle ears demonstrate no  substantial mucosal disease.   Paranasal sinuses: Scattered mucosal thickening of paranasal sinuses.  Brain: No acute large vascular territory infarct. No mass effect. No hydrocephalus. No acute hemorrhage.      Impression:      No acute intracranial abnormality. However, if there is clinical concern for acute ischemia, MRI could further evaluate as CT is relatively insensitive for the detection of an acute infarct in the first 24 to 48 hours.        IMPRESSION/PLAN: # Presenting illness/working diagnosis: Non-oliguric AKI on CKD4/5 vs progression of CKD Uncontrolled hypertension Non-anion gap metabolic acidosis Hyperkalemia resistant to medical management  # Nonoliguric Acute kidney injury/acute renal failure on CKD4/5 vs CKD 5 progression Recent Labs    02/09/23 1803 02/10/23 0047 02/10/23 0635 02/10/23 1338  BUN 40* 28* 30* 35*  CREATININE 4.93* 3.88* 4.18* 4.46*  EGFR 9* 12* 11* 11*    Renal Indices:  BL Cr 2.5-2.8mg  since 1/23, most recently 3-3.4mg /dl, admit cr 5.47, peak cr 4.93 Urine Studies:  UA  -none recently  ; UPC -22g Imaging Studies:  sono 10.1/7.1cm kidney, mod rt hydro, CT with lf nonobs 1cm lf calculus. No hydro Other Studies: na   Etiology of AKI:   Suspect likely progression to CKD5 given cr 9/24 with egfr ~57ml/min.  Patient has longstanding CKD w/ atrophic L kidney and extensive bilateral cortical scarring favoring progression to ESRD. However potential AKI component d/t hypertensive emergency/ischemic damage.  Additionally, sono with mod hydro. Though CT does not demonstrate this, would review with GU   Renal Course:  1/4: initiated iHD for clearance in setting of hyperkalemia/acidosis  #Electrolytes Recent Labs    02/08/23 1631 02/08/23 1803 02/09/23 0042 02/09/23 0527 02/09/23 1803 02/10/23 0047 02/10/23 0635 02/10/23 1338  NA 137   < > 136   < > 136 135* 133* 134*  K 4.9   < > 5.6*   < > 4.5 4.4 5.0 4.4  MG 1.7*  --  1.6*  --   --   2.0  --   --    < > = values in this interval not displayed.    Sodium: Hyponatremia Potassium: Acceptable Magnesium :Acceptable  # BMM  Recent Labs    02/08/23 1631 02/08/23 1803 02/09/23 0042 02/09/23 0527 02/09/23 1803 02/10/23 0047 02/10/23 0635 02/10/23 1338  CALCIUM 8.3*   < > 8.2*   < > 8.3* 8.3* 7.9* 7.8*  PHOS 4.2  --  4.7  --   --  4.5  --   --   ALBUMIN 3.2*  --  3.2*  --   --  2.9*  --   --    < > = values in this interval not displayed.   Lab Results  Component Value Date   PTH 492 (H) 02/10/2023   PTH 149 (H) 07/18/2022   VITD <8.0 (L) 02/10/2023   VITD 20.5 (L) 07/18/2022    Calcium: Hypocalcemia corrects for alb  Phosphorus: Acceptable  # Acid -Base status Recent Labs    02/09/23 1803 02/10/23 0047 02/10/23 0635 02/10/23 1338  CO2 20* 24 23 23   ANIONGAP 5* 4* 6 9    Nongap metabolic acidosis-resolved  # Hematology Recent Labs    02/08/23 1315 02/08/23 1631 02/09/23 0042 02/10/23 0047  WBC 6.40 6.70 6.20 6.20  HGB 9.3* 8.0* 8.2* 7.5*  PLT 204 185 201 154   Lab Results  Component Value Date   TRANSSAT 28 02/10/2023   FERRITIN 104 02/10/2023   IRON 64 02/10/2023    Anemia  # Volume status:  Euvolemia # Blood pressure: Hypertensive on current regimen # Medication dosing: Reviewed for current GFR, see any recommended changes below. # Dialysis consent: 02/09/23  RECOMMENDATIONS -Check UA completion.  Upc with 22g suspect progression. Though alb low, no anasarca or gross edema that would be expected with nephrotic syndrome -GU consult to evaluate discrepant imaging b/w US  and CT. Notes Rt hydro chronic.  Likely not contributing to current presentation -1450 UOP recorded yesterday; will monitor -No acute need for HD today. Will continue to monitor; Call 1st for HD Monday -PTH elevated in line with secondary HPT d/t CKD progression. Plan for VDA with HD -worsening anemia likely d/t CKD progression. But will defer to primary team for  further w/u as needed as hb dropped significantly in recent 6months. Iron panel with some suggestion of iron deficiency ie low ferritin though tsat not <20%.  Recommend iron supplementation/esa  HTN: patient  noted to have multiple issues with antihypertensives - currently on labetolol 200 mg bid - Please discontinue iv Hydralazine  given erratic decreases in BP that will cause AKI of her residual kidney function    -Obtain daily labs including BMP, Mag, Phos, CBC  -Daily standing weights (if able)  -Strict I&Os  -Avoid lovenox , NSAIDS, morphine, Fleet's enema, regular insulin   -Judicious use of IV contrast  -Midlines/PICC should be avoided in CKD4/5 and ESRD patients to preserve future vascular access sites     Please page the covering provider for the [facility] ICU service in SecureChat with any questions regarding this patient.   Electronically signed by: Morene Waddell Cower, MD 02/10/2023 8:43 AM    ATTENDING ADDENDUM:  I saw and evaluated the patient and reviewed the resident's note. I agree with the resident's findings and plan.  I have independently reviewed the patient's EMR including laboratory, imaging, microbiology studies.  The above note has been amended to reflect my clinical findings and assessment.  Amended above note.  No acute RRT needs Call first for tomorrow           Electronically signed by: Loletha KANDICE Buttery, MD 02/10/2023 7:08 PM

## 2023-02-11 NOTE — Progress Notes (Signed)
 NEPHROLOGY ICU Progress Note: Acute Kidney Injury/Acute Renal Failure  Date of service: 02/11/2023  INTERVAL: No acute events overnight Feels pretty well this morning, sitting up in bedside chair eating breakfast  Denies shortness of breath, chest pain, headache and blurry vision have resolved   UOP 800 mL yesterday Continues to have labile BP, hypertensive with SBP 180s this am   SYNOPSIS (from initial consult): Jessica Patterson is a 63 y.o. 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 She had been advised to go to ED due outpatient labs showing AKI on CKD w/ hyperkalemia. Her BL Cr has been progressively increasing over the last year from 2.7 to 3.4. On  admission, her Cr was 4.52 which increased to 4.93 over the ensuing 24 hour interval. She also had recurrent hyperkalemia despite potassium binder and temporization as well as metabolic acidosis w/ bicarb to 13. UOP 750 mL yesterday.   On renal US  patient has atrophic L kidney and moderate right hydronephrosis w/ increased echogenecity. Follow CT without hydronephrosis and bilateral cortical scarring.  Given the patient's recurrent hyperkalemia, acidosis, rising creatinine on eGFR baseline of 15 as well as evidence of bilateral tissue scarring, hemodialysis was recommended to the patient, to which the patient consented.   CURRENT MEDICATIONS: busPIRone , 5 mg, oral, BID heparin  (porcine), 5,000 Units, subcutaneous, Q8H SCH labetaloL , 300 mg, oral, Q12H SCH mupirocin, 1 Application, Each Nostril, BID sodium bicarbonate , 1,300 mg, oral, TID sodium zirconium cyclosilicate , 10 g, oral, Daily    PHYSICAL EXAMINATION: Vitals:   02/11/23 0300  02/11/23 0400 02/11/23 0500 02/11/23 0600  BP: 143/81 (!) 156/92 (!) 172/85 (!) 182/95  Pulse: 70 76 69 72  Resp: 16 17 15 14   Temp:  97.6 F (36.4 C)    TempSrc:  Oral    SpO2: 93% 94% 95% 96%  Weight:      Height:       Constitutional: Alert, no apparent distress, sitting up in bedside chair  Cardiovascular: Regular rhythm, normal s1/s2 heart sounds, no murmurs, no rubs, trace edema Pulmonary: clear on auscultation, normal respiratory effort on room air  Gastrointestinal: Active bowel sounds. Abdomen soft, non-tender, non-distended. Musculoskeletal: No active synovitis, no joint deformities Skin: no rash, no induration on extremities Psychiatric: Normal eye contact and language, affect is appropriate Dialysis Access: RIJ VC placed 1/4 c/d/i  LABS: Recent Labs    02/08/23 1631 02/08/23 1803 02/09/23 0042 02/09/23 0527 02/10/23 0047 02/10/23 0635 02/10/23 1338 02/10/23 1812 02/11/23 0004 02/11/23 0522  NA 137   < > 136   < > 135*   < > 134* 136 135* 137  K 4.9   < > 5.6*   < > 4.4   < > 4.4 4.5 4.9 5.2*  CL 114*   < > 115*   < > 107   < > 102 104 106 107  CO2 16*   < > 15*   < > 24   < > 23 23 23 22   BUN 42*   < > 41*   < > 28*   < > 35* 39* 42* 42*  CREATININE 4.42*   < > 4.62*   < > 3.88*   < > 4.46* 4.36* 5.02* 5.08*  CALCIUM 8.3*   < >  8.2*   < > 8.3*   < > 7.8* 8.0* 7.9* 8.1*  PHOS 4.2  --  4.7  --  4.5  --   --   --   --  4.8  ALBUMIN 3.2*  --  3.2*  --  2.9*  --   --   --   --  2.9*  MG 1.7*  --  1.6*  --  2.0  --   --   --   --  1.9  WBC 6.70  --  6.20  --  6.20  --   --   --   --  5.60  HGB 8.0*  --  8.2*  --  7.5*  --   --   --   --  7.4*  PLT 185  --  201  --  154  --   --   --   --  164   < > = values in this interval not displayed.   Albumin  Date Value Ref Range Status  02/11/2023 2.9 (L) 3.5 - 5.7 g/dL Final   External Albumin  Date Value Ref Range Status  02/01/2023 3.5 g/dL Final   No results found for: BNP  PERTINENT WORKUP: Radiology  Results (last 72 hours)     Procedure Component Value Units Date/Time   XR Chest 1 View [[id number]] Collected: 02/09/23 1612   Order Status: Completed Updated: 02/09/23 1921   Narrative:     XR CHEST 1 VIEW, 02/09/2023 3:18 PM  INDICATION:central line placement  COMPARISON: Chest radiograph 03/07/2022  FINDINGS:   Supportive devices: Right IJ approach central venous catheter with tip projecting over the distal SVC. EKG leads overlie the chest. Cardiovascular/lungs/pleura: Cardiac silhouette and pulmonary vasculature are within normal limits. Diffuse interstitial opacities. No pleural effusion or pneumothorax.  Other: Visualized upper abdomen is unremarkable. No acute displaced fracture. Polyarticular degenerative changes.    Impression:     Right IJ approach central venous catheter with tip projecting over the distal SVC. No pneumothorax.    CT Stone Search WO Contrast [[id number]] Collected: 02/09/23 1101   Order Status: Completed Updated: 02/09/23 1418   Narrative:     CT STONE SEARCH WO CONTRAST, 02/09/2023 10:52 AM  INDICATION: Nephrolithiasis  COMPARISON: Multiple prior CTs, most recent 02/25/2022.  TECHNIQUE: CT images of the abdomen and pelvis were obtained without the use of intravenous contrast. Conventional axial reconstructions and multiplanar reformatted images were submitted for review.   LIMITATIONS: Evaluation of various soft tissue structures as well as the vasculature is limited without the availability of intravenous contrast. Parameters utilized in some study protocols to mitigate patient radiation exposure can also reduce sensitivity and specificity of assessment.  FINDINGS:  . Lower Chest: Bilateral subsegmental atelectasis.  . Liver: No suspicious focal findings. . Gallbladder/Biliary: Partially decompressed. No intrahepatic or extrahepatic biliary duct dilation. [name] Spleen: Unremarkable. . Pancreas: Unremarkable. . Adrenals: Unremarkable. . Kidneys/ureters:  Similar 2.5 cm right upper pole and lower pole hypointensities, likely benign cysts. Similar left lower pole 1.6 cm hypodensity, likely benign cysts. Similar 1 cm left upper pole nonobstructing renal calculus. Redemonstrated bilateral renal cortical scarring and associated parenchymal volume loss. No hydronephrosis. Prior ureteral diversion.  . Peritoneum/Mesenteries/Extraperitoneum: No free air. No free fluid or loculated drainable collection. No pathologically enlarged lymph nodes. . Gastrointestinal tract: Likely alimentary residue in the stomach. Redemonstrated descending colostomy. Similar ventral midline infraumbilical abdominal wall hernia with multiple loops was small bowel, no evidence of obstruction. Colonic diverticulosis, without diverticulitis. No  evidence of obstruction.  . Bladder: Redemonstrated colonic urinary diversion the pelvis. . Reproductive System: Status post hysterectomy and bilateral salpingo-oophorectomy.  . Vascular: Mild aortobiiliac atherosclerosis. . Musculoskeletal: Redemonstrated diastasis of the symphysis pubis consistent with history of bladder exstrophy. Similar T9 and L5 hemangiomas. Multilevel degenerative changes of the spine. No acute displaced fractures. No aggressive focal bony lesions. 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    Impression:     1.  Redemonstrated left-sided nonobstructing renal calculus. No hydronephrosis bilaterally. 2.  Chronic/stable ancillary findings as above.   US  Renal Bilateral Complete [[id number]] Collected: 02/08/23 1858   Order Status: Completed Updated: 02/08/23 2100   Narrative:     US  RENAL BILATERAL COMPLETE, 02/08/2023 6:48 PM  INDICATION: new onset renal failure  COMPARISON: CT abdomen/pelvis 03/07/2022  TECHNIQUE: Multi-planar, real-time ultrasonography of the retroperitoneum and urinary tract using grayscale imaging was performed, supplemented by color and/or power Doppler as needed.  FINDINGS:  .   Right Kidney: Length = 10.1 cm. Moderate hydronephrosis. Diffuse increased echogenicity. Simple appearing cysts, the largest measuring 2.9 cm.  .  Left Kidney: Visualization of the left kidney is markedly limited. Length = approximately 7.1 cm. Atrophic with diffusely increased echogenicity. No definite hydronephrosis. Simple appearing cyst measuring 1.2 cm . [name]  Bladder: Status post colonic urinary diversion. .  Additional comments: None.    Impression:     1.  Moderate right hydronephrosis. 2.  Diffusely increased echogenicity of the right kidney, which can be seen in medical renal disease. 3.  Markedly limited evaluation of the atrophic left kidney.      CT Head WO Contrast [[id number]] Collected: 02/08/23 1633   Order Status: Completed Updated: 02/08/23 1705   Narrative:     CT HEAD WITHOUT CONTRAST, 02/08/2023 4:29 PM  INDICATION: Headache, new or worsening (Age >= 50y)   COMPARISON: None  TECHNIQUE: Axial CT images of the brain from skull base to vertex, including portions of the face and sinuses, were obtained without contrast. Supplemental 2D reformatted images were generated and reviewed as needed.  All CT scans at [facility] and [facility] are performed using radiation dose optimization techniques as appropriate to a performed exam, including but not limited to one or more of the following: automatic exposure control, adjustment of the mA and/or kV according to patient size, use of iterative reconstruction technique. In addition, our institution participates in a radiation dose monitoring program to optimize patient radiation exposure.   FINDINGS:  Calvarium/skull base: No evidence of acute fracture or destructive lesion. Mastoids and middle ears demonstrate no substantial mucosal disease.   Paranasal sinuses: Scattered mucosal thickening of paranasal sinuses.  Brain: No acute large vascular territory infarct. No mass effect. No  hydrocephalus. No acute hemorrhage.      Impression:      No acute intracranial abnormality. However, if there is clinical concern for acute ischemia, MRI could further evaluate as CT is relatively insensitive for the detection of an acute infarct in the first 24 to 48 hours.      IMPRESSION/PLAN: # Presenting illness/working diagnosis: Non-oliguric AKI on CKD4/5 vs progression of CKD Uncontrolled hypertension Non-anion gap metabolic acidosis Hyperkalemia resistant to medical management  # Nonoliguric Acute kidney injury/acute renal failure on CKD4/5 vs CKD 5 progression Recent Labs    02/10/23 1338 02/10/23 1812 02/11/23 0004 02/11/23 0522  BUN 35* 39* 42* 42*  CREATININE 4.46* 4.36* 5.02* 5.08*  EGFR 11* 11* 9* 9*  Renal Indices:  BL Cr 2.5-2.8mg  since 1/23, most recently 3-3.4mg /dl, admit cr 5.47, peak cr 4.93 Urine Studies:  UA  -none recently  ; UPC -22g Imaging Studies:  sono 10.1/7.1cm kidney, mod rt hydro, CT with lf nonobs 1cm lf calculus. No hydro Other Studies: na   Etiology of AKI:   Suspect likely progression to CKD5 given cr 9/24 with egfr ~68ml/min.  Patient has longstanding CKD w/ atrophic L kidney and extensive bilateral cortical scarring favoring progression to ESRD. However potential AKI component d/t hypertensive emergency/ischemic damage.  Additionally, sono with mod hydro. Though CT does not demonstrate this, would review with GU   Renal Course:  1/4: initiated iHD for clearance in setting of hyperkalemia/acidosis  #Electrolytes Recent Labs    02/08/23 1631 02/08/23 1803 02/09/23 0042 02/09/23 0527 02/10/23 0047 02/10/23 0635 02/10/23 1338 02/10/23 1812 02/11/23 0004 02/11/23 0522  NA 137   < > 136   < > 135*   < > 134* 136 135* 137  K 4.9   < > 5.6*   < > 4.4   < > 4.4 4.5 4.9 5.2*  MG 1.7*  --  1.6*  --  2.0  --   --   --   --  1.9   < > = values in this interval not displayed.   Sodium: Acceptable Potassium:  Hyperkalemia Magnesium :Acceptable  # BMM  Recent Labs    02/08/23 1631 02/08/23 1803 02/09/23 0042 02/09/23 0527 02/10/23 0047 02/10/23 0635 02/10/23 1338 02/10/23 1812 02/11/23 0004 02/11/23 0522  CALCIUM 8.3*   < > 8.2*   < > 8.3*   < > 7.8* 8.0* 7.9* 8.1*  PHOS 4.2  --  4.7  --  4.5  --   --   --   --  4.8  ALBUMIN 3.2*  --  3.2*  --  2.9*  --   --   --   --  2.9*   < > = values in this interval not displayed.   Lab Results  Component Value Date   PTH 492 (H) 02/10/2023   PTH 149 (H) 07/18/2022   VITD <8.0 (L) 02/10/2023   VITD 20.5 (L) 07/18/2022   Calcium: Acceptable when corrected for albumin Phosphorus: Acceptable  # Acid -Base status Recent Labs    02/10/23 1338 02/10/23 1812 02/11/23 0004 02/11/23 0522  CO2 23 23 23 22   ANIONGAP 9 9 6 8    Acceptable  # Hematology Recent Labs    02/08/23 1631 02/09/23 0042 02/10/23 0047 02/11/23 0522  WBC 6.70 6.20 6.20 5.60  HGB 8.0* 8.2* 7.5* 7.4*  PLT 185 201 154 164   Lab Results  Component Value Date   TRANSSAT 28 02/10/2023   FERRITIN 104 02/10/2023   IRON 64 02/10/2023   Anemia  # Volume status:  Euvolemia # Blood pressure: Hypertensive on current regimen # Medication dosing: Reviewed for current GFR, see any recommended changes below. # Dialysis consent: 02/09/23, during initial consult Nephrology team discussed that this will likely be chronic dialysis given persistent hyperkalemia not responsive to medical management. Follows closely with Dr. Fenton    RECOMMENDATIONS -Upc with 22g suspect progression to ESRD. 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 Plan for VDA with HD. Can start calcitriol 0.5 mcg daily    -worsening anemia likely d/t CKD progression. Will start ESA when hypertension has improved   HTN: patient  noted to have multiple issues with antihypertensives - Labetalol  increased to 300 mg BID and home lisinopril 5 mg daily restarted this morning - Was also taking minoxidil 2.5 mg daily at home   Low potassium diet   -Obtain daily labs including BMP, Mag, Phos, CBC  -Daily standing weights (if able)  -Strict I&Os  -Avoid lovenox , NSAIDS, morphine, Fleet's enema, regular insulin   -Judicious use of IV contrast  -Midlines/PICC should be avoided in CKD4/5 and ESRD patients to preserve future vascular access sites     Please page the covering provider for the [facility] ICU service in SecureChat with any questions regarding this patient.   Electronically signed by: Bernardino Toni Hope, MD 02/11/2023 8:08 AM    ATTENDING: I saw and evaluated the patient. I discussed with the nephrology fellow the clinical findings and the management plans. I agree with the above note.  Electronically signed by: Orlene Roux, MD 02/11/2023 6:38 PM

## 2023-02-12 NOTE — Care Plan (Signed)
 Request received from team Kathaleen) for perm cath placement. Pt currently in ICU but will transfer to floor when bed available. Vas cath to right internal jugular vein placed on 02/09/23. Labs/meds ok. IR can plan for perm placement on Thursday, 02/14/23. Team notified by chat.

## 2023-02-12 NOTE — Progress Notes (Signed)
 NEPHROLOGY ICU Progress Note: Acute Kidney Injury/Acute Renal Failure  Date of service: 02/12/2023  INTERVAL: No acute events overnight  800 mL UOP and tolerated iHD with 1L UF yesterday Feels well this morning, no acute complaints   Bedboarded to floor   SYNOPSIS (from initial consult): Jessica Patterson is a 63 y.o. 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 She had been advised to go to ED due outpatient labs showing AKI on CKD w/ hyperkalemia. Her BL Cr has been progressively increasing over the last year from 2.7 to 3.4. On  admission, her Cr was 4.52 which increased to 4.93 over the ensuing 24 hour interval. She also had recurrent hyperkalemia despite potassium binder and temporization as well as metabolic acidosis w/ bicarb to 13. UOP 750 mL yesterday.   On renal US  patient has atrophic L kidney and moderate right hydronephrosis w/ increased echogenecity. Follow CT without hydronephrosis and bilateral cortical scarring.  Given the patient's recurrent hyperkalemia, acidosis, rising creatinine on eGFR baseline of 15 as well as evidence of bilateral tissue scarring, hemodialysis was recommended to the patient, to which the patient consented.   CURRENT MEDICATIONS: busPIRone , 5 mg, oral, BID calcitrioL, 0.5 mcg, oral, Daily cholecalciferol (VITAMIN D3), 1,000 Units, oral, Daily heparin  (porcine), 5,000 Units, subcutaneous, Q8H SCH labetaloL , 400 mg, oral, Q12H SCH lactulose, 20 g, oral, TID mupirocin, 1 Application, Each Nostril, BID sodium zirconium cyclosilicate , 10 g, oral, TID    PHYSICAL EXAMINATION: Vitals:   02/12/23 0400 02/12/23 0412 02/12/23 0500 02/12/23 0600  BP:  (!)  179/94 157/90 (!) 177/93  BP Location:      Patient Position:      Pulse: 72 72 67 72  Resp:  15 18 13   Temp: 97.7 F (36.5 C)     TempSrc: Oral     SpO2: 93% 98% 97% 97%  Weight:      Height:       Constitutional: Alert, no apparent distress, sitting up in bedside chair  Cardiovascular: Regular rhythm, normal s1/s2 heart sounds, no murmurs, no rubs, no edema Pulmonary: clear on auscultation, normal respiratory effort on room air  Gastrointestinal: Active bowel sounds. Abdomen soft, non-tender, non-distended, colostomy in place  Musculoskeletal: No active synovitis, no joint deformities Skin: no rash, no induration on extremities Psychiatric: Normal eye contact and language, affect is appropriate Dialysis Access: RIJ VC placed 1/4 c/d/i  LABS: Recent Labs    02/10/23 0047 02/10/23 0635 02/11/23 0522 02/11/23 1239 02/11/23 1838 02/12/23 0038 02/12/23 0054 02/12/23 0552  NA 135*   < > 137 134* 135*  --  134* 135*  K 4.4   < > 5.2* 5.8* 5.1  --  3.8 4.6  CL 107   < > 107 104 103  --  100 104  CO2 24   < > 22 22 24   --  27 26  BUN 28*   < > 42* 45* 50*  --  27* 27*  CREATININE 3.88*   < > 5.08* 5.06* 5.28*  --  3.30* 3.76*  CALCIUM 8.3*   < > 8.1* 7.8* 8.3*  --  8.4* 8.1*  PHOS 4.5  --  4.8  --   --   --  3.9  --  ALBUMIN 2.9*  --  2.9*  --   --   --  3.3*  --   MG 2.0  --  1.9  --   --   --  1.9  --   WBC 6.20  --  5.60  --   --  6.20  --   --   HGB 7.5*  --  7.4*  --   --  7.6*  --   --   PLT 154  --  164  --   --  123*  --   --    < > = values in this interval not displayed.   Albumin  Date Value Ref Range Status  02/12/2023 3.3 (L) 3.5 - 5.7 g/dL Final   External Albumin  Date Value Ref Range Status  02/01/2023 3.5 g/dL Final   No results found for: BNP  PERTINENT WORKUP: Radiology Results (last 72 hours)     Procedure Component Value Units Date/Time   XR Chest 1 View [[id number]] Collected: 02/09/23 1612   Order Status: Completed Updated: 02/09/23 1921    Narrative:     XR CHEST 1 VIEW, 02/09/2023 3:18 PM  INDICATION:central line placement  COMPARISON: Chest radiograph 03/07/2022  FINDINGS:   Supportive devices: Right IJ approach central venous catheter with tip projecting over the distal SVC. EKG leads overlie the chest. Cardiovascular/lungs/pleura: Cardiac silhouette and pulmonary vasculature are within normal limits. Diffuse interstitial opacities. No pleural effusion or pneumothorax.  Other: Visualized upper abdomen is unremarkable. No acute displaced fracture. Polyarticular degenerative changes.    Impression:     Right IJ approach central venous catheter with tip projecting over the distal SVC. No pneumothorax.    CT Stone Search WO Contrast [[id number]] Collected: 02/09/23 1101   Order Status: Completed Updated: 02/09/23 1418   Narrative:     CT STONE SEARCH WO CONTRAST, 02/09/2023 10:52 AM  INDICATION: Nephrolithiasis  COMPARISON: Multiple prior CTs, most recent 02/25/2022.  TECHNIQUE: CT images of the abdomen and pelvis were obtained without the use of intravenous contrast. Conventional axial reconstructions and multiplanar reformatted images were submitted for review.   LIMITATIONS: Evaluation of various soft tissue structures as well as the vasculature is limited without the availability of intravenous contrast. Parameters utilized in some study protocols to mitigate patient radiation exposure can also reduce sensitivity and specificity of assessment.  FINDINGS:  . Lower Chest: Bilateral subsegmental atelectasis.  . Liver: No suspicious focal findings. . Gallbladder/Biliary: Partially decompressed. No intrahepatic or extrahepatic biliary duct dilation. [name] Spleen: Unremarkable. . Pancreas: Unremarkable. . Adrenals: Unremarkable. . Kidneys/ureters: Similar 2.5 cm right upper pole and lower pole hypointensities, likely benign cysts. Similar left lower pole 1.6 cm hypodensity, likely benign cysts. Similar 1 cm left upper pole  nonobstructing renal calculus. Redemonstrated bilateral renal cortical scarring and associated parenchymal volume loss. No hydronephrosis. Prior ureteral diversion.  . Peritoneum/Mesenteries/Extraperitoneum: No free air. No free fluid or loculated drainable collection. No pathologically enlarged lymph nodes. . Gastrointestinal tract: Likely alimentary residue in the stomach. Redemonstrated descending colostomy. Similar ventral midline infraumbilical abdominal wall hernia with multiple loops was small bowel, no evidence of obstruction. Colonic diverticulosis, without diverticulitis. No evidence of obstruction.  . Bladder: Redemonstrated colonic urinary diversion the pelvis. . Reproductive System: Status post hysterectomy and bilateral salpingo-oophorectomy.  . Vascular: Mild aortobiiliac atherosclerosis. . Musculoskeletal: Redemonstrated diastasis of the symphysis pubis consistent with history of bladder exstrophy. Similar T9 and L5 hemangiomas. Multilevel degenerative changes of the spine. No acute displaced fractures. No aggressive focal  bony lesions. 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    Impression:     1.  Redemonstrated left-sided nonobstructing renal calculus. No hydronephrosis bilaterally. 2.  Chronic/stable ancillary findings as above.      IMPRESSION/PLAN: # Presenting illness/working diagnosis: Non-oliguric AKI on CKD4/5 vs progression to ESRD Uncontrolled hypertension Non-anion gap metabolic acidosis Hyperkalemia resistant to medical management  # Nonoliguric Acute kidney injury/acute renal failure on CKD4/5 vs CKD 5 progression Recent Labs    02/11/23 1239 02/11/23 1838 02/12/23 0054 02/12/23 0552  BUN 45* 50* 27* 27*  CREATININE 5.06* 5.28* 3.30* 3.76*  EGFR 9* 9* 15* 13*    Renal Indices:  BL Cr 2.5-2.8mg  since 1/23, most recently 3-3.4mg /dl, admit cr 5.47, peak cr 4.93 Urine Studies:  UA  -none recently  ; UPC -22g Imaging Studies:  sono  10.1/7.1cm kidney, mod rt hydro, CT with lf nonobs 1cm lf calculus. No hydro Other Studies: na   Etiology of AKI:   Suspect likely progression to CKD5 given cr 9/24 with egfr ~20ml/min.  Patient has longstanding CKD w/ atrophic L kidney and extensive bilateral cortical scarring favoring progression to ESRD. However potential AKI component d/t hypertensive emergency/ischemic damage.  Additionally, sono with mod hydro. Though CT does not demonstrate this, would review with GU   Renal Course:  1/4: initiated iHD for clearance in setting of hyperkalemia/acidosis  #Electrolytes Recent Labs    02/10/23 0047 02/10/23 0635 02/11/23 0522 02/11/23 1239 02/11/23 1838 02/12/23 0054 02/12/23 0552  NA 135*   < > 137 134* 135* 134* 135*  K 4.4   < > 5.2* 5.8* 5.1 3.8 4.6  MG 2.0  --  1.9  --   --  1.9  --    < > = values in this interval not displayed.   Sodium: Acceptable Potassium: Acceptable Magnesium :Acceptable  # BMM  Recent Labs    02/10/23 0047 02/10/23 0635 02/11/23 0522 02/11/23 1239 02/11/23 1838 02/12/23 0054 02/12/23 0552  CALCIUM 8.3*   < > 8.1* 7.8* 8.3* 8.4* 8.1*  PHOS 4.5  --  4.8  --   --  3.9  --   ALBUMIN 2.9*  --  2.9*  --   --  3.3*  --    < > = values in this interval not displayed.   Lab Results  Component Value Date   PTH 492 (H) 02/10/2023   PTH 149 (H) 07/18/2022   VITD <8.0 (L) 02/10/2023   VITD 20.5 (L) 07/18/2022   Calcium: Acceptable when corrected for albumin Phosphorus: Acceptable  # Acid -Base status Recent Labs    02/11/23 1239 02/11/23 1838 02/12/23 0054 02/12/23 0552  CO2 22 24 27 26   ANIONGAP 8 8 7  5*   Acceptable  # Hematology Recent Labs    02/10/23 0047 02/11/23 0522 02/12/23 0038  WBC 6.20 5.60 6.20  HGB 7.5* 7.4* 7.6*  PLT 154 164 123*   Lab Results  Component Value Date   TRANSSAT 28 02/10/2023   FERRITIN 104 02/10/2023   IRON 64 02/10/2023   Anemia  # Volume status:  Euvolemia # Blood pressure:  Hypertensive on current regimen # Medication dosing: Reviewed for current GFR, see any recommended changes below. # Dialysis consent: 02/09/23, during initial consult Nephrology team discussed that this will likely be chronic dialysis given persistent hyperkalemia not responsive to medical management. Follows closely with Dr. Fenton    RECOMMENDATIONS -No urgent indication for RRT today, anticipate next treatment tomorrow  -Considered getting 24h urine  collection for clearance calculation, but would be difficult to interpret in setting of vesicorectal urinary diversion  - Will need PC and to arrange for outpatient HD chair prior to discharge, would reach out to IR to schedule PermCath.   - Outpatient HD chair will be arranged with [facility] - Given the amount of RKF, we will recommend incremental HD, to start with twice-weekly HD, adjust in the future based on clinical manifestations and RKF volume  -PTH elevated in line with secondary HPT d/t CKD progression. Plan for VDA with HD.  -Vitamin D  supplementation   -worsening anemia likely d/t CKD progression. Will start ESA when hypertension has improved   HTN: patient  noted to have multiple issues with antihypertensives - Labetalol  increased to 400 mg BID today with better response  - Was also taking minoxidil 2.5 mg daily at home   Low potassium diet   -Daily standing weights (if able)  -Strict I&Os  -Avoid lovenox , NSAIDS, morphine, Fleet's enema, regular insulin   -Judicious use of IV contrast  -Midlines/PICC should be avoided in CKD4/5 and ESRD patients to preserve future vascular access sites     Please page the covering provider for the [facility] ICU service in SecureChat with any questions regarding this patient.   Electronically signed by: Bernardino Toni Hope, MD 02/12/2023 8:07 AM    ATTENDING: I saw and evaluated the patient. I discussed with the nephrology fellow the clinical findings and the management plans. I agree  with the above note.  Electronically signed by: Orlene Roux, MD 02/12/2023 7:38 PM

## 2023-02-13 NOTE — Progress Notes (Signed)
 NEPHROLOGY ICU Progress Note: Acute Kidney Injury/Acute Renal Failure  Date of service: 02/13/2023  INTERVAL: No acute events overnight  1L UOP yesterday No acute complaints this morning, though feeling anxious about chronic HD. Her primary Nephrologist, Dr. Fenton, came to visit with her this morning which was helpful  SYNOPSIS (from initial consult): Jessica Patterson is a 63 y.o. 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 She had been advised to go to ED due outpatient labs showing AKI on CKD w/ hyperkalemia. Her BL Cr has been progressively increasing over the last year from 2.7 to 3.4. On  admission, her Cr was 4.52 which increased to 4.93 over the ensuing 24 hour interval. She also had recurrent hyperkalemia despite potassium binder and temporization as well as metabolic acidosis w/ bicarb to 13. UOP 750 mL yesterday.   On renal US  patient has atrophic L kidney and moderate right hydronephrosis w/ increased echogenecity. Follow CT without hydronephrosis and bilateral cortical scarring.  Given the patient's recurrent hyperkalemia, acidosis, rising creatinine on eGFR baseline of 15 as well as evidence of bilateral tissue scarring, hemodialysis was recommended to the patient, to which the patient consented.   CURRENT MEDICATIONS: busPIRone , 5 mg, oral, BID ergocalciferol , 50,000 Units, oral, Weekly heparin  (porcine), 5,000 Units, subcutaneous, Q8H SCH labetaloL , 300 mg, oral, Q12H SCH mupirocin, 1 Application, Each Nostril, BID    PHYSICAL EXAMINATION: Vitals:   02/13/23 0500 02/13/23 0600 02/13/23 0700 02/13/23 0800  BP: (!) 174/91 (!) 160/98 (!) 164/95 (!) 127/109  BP Location:   Left arm    Patient Position:   Lying   Pulse: 72 69 76 79  Resp: 12 12 (!) 21 18  Temp:   97.3 F (36.3 C)   TempSrc:   Oral   SpO2: 95% 95% 95% 95%  Weight:  55.7 kg (122 lb 12.7 oz)    Height:       Constitutional: Alert, no apparent distress, sitting up in bedside chair  Cardiovascular: Regular rhythm, normal s1/s2 heart sounds, no murmurs, no rubs, no edema Pulmonary: clear on auscultation, normal respiratory effort on room air  Gastrointestinal: Active bowel sounds. Abdomen soft, non-tender, non-distended, colostomy in place  Musculoskeletal: No active synovitis, no joint deformities Skin: no rash, no induration on extremities Psychiatric: Normal eye contact and language, affect is appropriate Dialysis Access: RIJ VC placed 1/4 c/d/i  LABS: Recent Labs    02/11/23 0522 02/11/23 1239 02/12/23 0038 02/12/23 0054 02/12/23 0552 02/12/23 1150 02/12/23 1754 02/13/23 0048  NA 137   < >  --  134* 135* 135* 133* 137  135*  K 5.2*   < >  --  3.8 4.6 5.0 5.0 4.7  4.7  CL 107   < >  --  100 104 101 101 103  102  CO2 22   < >  --  27 26 27 25 26  25   BUN 42*   < >  --  27* 27* 31* 37* 43*  43*  CREATININE 5.08*   < >  --  3.30* 3.76* 3.99* 4.70* 4.78*  4.79*  CALCIUM 8.1*   < >  --  8.4* 8.1* 8.1* 8.1* 8.4*  8.4*  PHOS 4.8  --   --  3.9  --   --   --  5.4*  ALBUMIN 2.9*  --   --  3.3*  --   --   --  3.0*  MG 1.9  --   --  1.9  --   --   --  2.0  WBC 5.60  --  6.20  --   --   --   --  6.20  HGB 7.4*  --  7.6*  --   --   --   --  6.9*  PLT 164  --  123*  --   --   --   --  127*   < > = values in this interval not displayed.   Albumin  Date Value Ref Range Status  02/13/2023 3.0 (L) 3.5 - 5.7 g/dL Final   External Albumin  Date Value Ref Range Status  02/01/2023 3.5 g/dL Final   No results found for: BNP  PERTINENT WORKUP: Radiology Results (last 72 hours)     ** No results found for the last 72 hours. **      IMPRESSION/PLAN: # Presenting illness/working  diagnosis: Non-oliguric AKI on CKD4/5 vs progression to ESRD Uncontrolled hypertension Non-anion gap metabolic acidosis Hyperkalemia resistant to medical management  # Nonoliguric Acute kidney injury/acute renal failure on CKD4/5 vs CKD 5 progression Recent Labs    02/12/23 0552 02/12/23 1150 02/12/23 1754 02/13/23 0048  BUN 27* 31* 37* 43*  43*  CREATININE 3.76* 3.99* 4.70* 4.78*  4.79*  EGFR 13* 12* 10* 10*  10*    Renal Indices:  BL Cr 2.5-2.8mg  since 1/23, most recently 3-3.4mg /dl, admit cr 5.47, peak cr 4.93 Urine Studies:  UA  -none recently  ; UPC -22g Imaging Studies:  sono 10.1/7.1cm kidney, mod rt hydro, CT with lf nonobs 1cm lf calculus. No hydro Other Studies: na   Etiology of AKI:   Suspect likely progression to CKD5 given cr 9/24 with egfr ~3ml/min.  Patient has longstanding CKD w/ atrophic L kidney and extensive bilateral cortical scarring favoring progression to ESRD. However potential AKI component d/t hypertensive emergency/ischemic damage.  Additionally, sono with mod hydro. Though CT does not demonstrate this, would review with GU   Renal Course:  1/4: initiated iHD for clearance in setting of hyperkalemia/acidosis  #Electrolytes Recent Labs    02/11/23 0522 02/11/23 1239 02/12/23 0054 02/12/23 0552 02/12/23 1150 02/12/23 1754 02/13/23 0048  NA 137   < > 134* 135* 135* 133* 137  135*  K 5.2*   < > 3.8 4.6 5.0 5.0 4.7  4.7  MG 1.9  --  1.9  --   --   --  2.0   < > = values in this interval not displayed.   Sodium: Acceptable Potassium: Acceptable Magnesium :Acceptable  # BMM  Recent Labs    02/11/23 0522 02/11/23 1239 02/12/23 0054 02/12/23 0552 02/12/23 1150 02/12/23 1754 02/13/23 0048  CALCIUM 8.1*   < > 8.4* 8.1* 8.1* 8.1* 8.4*  8.4*  PHOS 4.8  --  3.9  --   --   --  5.4*  ALBUMIN 2.9*  --  3.3*  --   --   --  3.0*   < > = values in this interval not displayed.   Lab Results  Component Value Date   PTH 492 (H) 02/10/2023    PTH 149 (H) 07/18/2022   VITD <8.0 (L) 02/10/2023   VITD 20.5 (L) 07/18/2022   Calcium: Acceptable when corrected for albumin Phosphorus: Hyperphosphatemia,  mild  # Acid -Base status Recent Labs    02/12/23 0552 02/12/23 1150 02/12/23 1754 02/13/23 0048  CO2 26 27 25 26  25   ANIONGAP 5* 7 7 8  8    Acceptable  # Hematology Recent Labs    02/11/23 0522 02/12/23 0038 02/13/23 0048  WBC 5.60 6.20 6.20  HGB 7.4* 7.6* 6.9*  PLT 164 123* 127*   Lab Results  Component Value Date   TRANSSAT 28 02/10/2023   FERRITIN 104 02/10/2023   IRON 64 02/10/2023   Anemia  # Volume status:  Euvolemia # Blood pressure: Normotensive  # Medication dosing: Reviewed for current GFR, see any recommended changes below. # Dialysis consent: 02/09/23, during initial consult Nephrology team discussed that this will likely be chronic dialysis given persistent hyperkalemia not responsive to medical management. Follows closely with Dr. Fenton    RECOMMENDATIONS -No urgent indication for RRT today, call 1st for HD tomorrow  -Planned for perm cath placement with IR tomorrow  - Outpatient HD chair will be arranged with [facility] - Given the amount of RKF, we will recommend incremental HD, to start with twice-weekly HD, adjust in the future to thrice-weekly HD based on clinical manifestations and RKF volume  -PTH elevated in line with secondary HPT d/t CKD progression. Plan for VDA with HD.  -Vitamin D  supplementation   -worsening anemia likely d/t CKD progression. She will get blood transfusion today. Ordered weekly aranesp for anemia management in ESRD (was not iron deficient on anemia profile)  HTN: patient  noted to have multiple issues with antihypertensives - Now only on labetalol  300 mg BID  Low potassium diet   -Daily standing weights (if able)  -Strict I&Os  -Avoid lovenox , NSAIDS, morphine, Fleet's enema, regular insulin   -Judicious use of IV contrast  -Midlines/PICC should be  avoided in CKD4/5 and ESRD patients to preserve future vascular access sites     Please page the covering provider for the [facility] ICU service in SecureChat with any questions regarding this patient.   Electronically signed by: Bernardino Toni Hope, MD 02/13/2023 8:20 AM    ATTENDING: I saw and evaluated the patient. I discussed with the nephrology fellow the clinical findings and the management plans. I agree with the above note.  Electronically signed by: Orlene Roux, MD 02/13/2023 10:42 PM

## 2023-02-13 NOTE — Progress Notes (Signed)
 ------------------------------------------------------------------------------- Attestation signed by Wyman Cato Casey, MD at 02/13/2023  1:48 PM Attending Attestation   This note is an entry for services provided today.   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 Overnight patient did request the dose to be decreased again to 300mg  PO BID as she felt we were giving tight BP control. Her BP remains stable. Patient confirmed that she does not take minoxidil at home but was taking lisinopril at home. We did recommend holding ACEi in context of borderline hyperkalemia despite HD. Perm cath placement scheduled tomorrow. Will make NPO after midnight and hold heparin  DVT ppx. Had acute Hg drop to 6.9 from 7.6. Will do anemia w/u. No signs of active GIB. Nephro will start ESA when hypertension resolved. Patient very anxious and emotional about going on HD. Have asked nephro team to discuss HD in detail with patient. Also have asked supportive care nursing to come by. Stable to transfer to floor today.    I have personally seen and performed a physical examination on [name]. I discussed the documented exam with the house staff and agree, have either edited the physical exam in the documentation, or listed my  changes below. I have participated in the medical decision making process and agree with the documentation of the Resident or Fellow as consistent with my own findings, exam and plan as amended or described in this note.  I have personally completed the following: reviewed the pertinent medical records including overnight events if applicable, discussed the patient with multidisciplinary critical care team, reviewed the laboratory and microbiology data, reviewed the pertinent imaging/radiographic results, reviewed pertinent medications, discussed the patient with the bedside nurse, and discussed respiratory management with the respiratory therapist.  Additionally, I have provided: communication with the patient and/or family as available to provide information, discussion of plans for initiating the discharge or post-hospital care, assessment and management of pain with intravenous or oral analgesics, optimization of pulmonary toilet and overall respiratory function, management of nutritional deficiencies, evaluation of and correction of electrolyte abnormalities as pertinent, and optimization of mobility to mitigate weakness and deconditioning.  I have discussed the patient with the following consulting services: Nephrology   Dhishna Cato Casey, MD  02/13/2023  1:46 PM ICU Attending     -------------------------------------------------------------------------------  Medical Intensive Care Unit Team B Progress Note  Date of admission: 02/08/2023, Length of Stay: 5  Jessica Patterson is a 63 y.o. female with PMHx sig for bladder exstrophy s/p Boyce-vest procedure (ureterosigmoidostomy with diverting colostomy), history of frequent urinary tract infections, history of nephrolithiasis requiring surgical extraction (found to be 20% calcium carbonate-phosphate, 80% struvite), CKD stage IV, chronic non-anion gap metabolic acidosis, hypertension who presents to the [facility] emergency  department on 12/30 with 3 to 4 days of headache and blurry vision, found to be in hypertensive emergency.  MICU course: 1/3: Admitted in the evening. Placed on labetalol  drip, experiencing nausea and vomiting, but had mild improvement in blood pressure. 1/4: Continued worsening of renal function, patient consented for dialysis. Vascath placed by MICU. K temporized again.  1/5: Continued to have labile BP.  1/6: Pt reports feeling much better and no longer has headache. Labetalol  was increased to 300 mg BID. Received HD. Bed boarded to Nephrology. 1/7: Labetalol  increased to 400 mg BID. Patient received 400 mg in the morning, but dose was decreased to 300 mg at night as patient felt it would drop BP too much. Tolerated HD with 1L UF.  Interval History:  02/13/2023 No acute events overnight. Dose of labetalol  decreased back to 300 mg overnight as patient felt it would drop BP too much and BP has been more controlled. Patient also noted to have Hgb of 6.9, type and screen ordered, consent for blood transfusion obtained this morning. No overt bleeding noted. Patient had a nose bleed while hospitalized but it has been controlled without a large amount of bleeding. Tolerated HD yesterday with 1L UF. UOP 1050 mL yesterday. Blood pressure more controlled this morning.  Pt still feeling well today. Denies headache, vision changes, chest pain, or shortness of breath. Has questions about HD. Tearful and states dialysis is going to change my life. Her support system is her sister. Scheduled for [facility] cath placement tomorrow with IR.   Objective: Vital Signs: Temp:  [97.3 F (36.3 C)-98.2 F (36.8 C)] 97.4 F (36.3 C) Heart Rate:  [67-80] 69 Resp:  [10-19] 12 BP: (108-182)/(61-98) 160/98    Continuous Meds sodium chloride , 30 mL/hr      Intake/Output Summary (Last 24 hours) at 02/13/2023 0628 Last data filed at 02/13/2023 0545 Gross per 24 hour  Intake 480 ml  Output 1550 ml  Net -1070 ml     Physical Exam:  02/13/23  General: NAD.  HEENT: Conjunctiva clear, no gross abnormalities, EOM grossly intact. Cardio: RRR +S1/S2. Systolic murmur.  Pulmonary: Lungs CTAB. No wheezes, rales, or rhonchi. Breathing comfortably on RA. Abdomen: Soft, nondistended, nontender to palpation. BS+. Colostomy bag in place. Skin around colostomy bag clean, dry.    Extremities: No edema, clubbing, or cyanosis.  Neuro: AAO x 4. No focal deficits.    Lines: Patient Lines/Drains/Airways Status     Active Peripherally inserted central catheter / Central venous catheter / Peripheral intravenous line / Drain / Airway / Intraosseous line / Epidural catheter / Arterial line / Line / Nasogastric/Orogastric Tube / Hemodialysis catheter / Umbilical venous catheter / Urethral Catheter / Intrauterine Pressure Catheter / Implantable Port / NHSN Urethral Catheter / NHSN Umbilical Catheter / Intentionally Retained Foreign Objects / AV Fistula / Peripheral Nerve Catheter     Name Placement date Placement time Site Days   Peripheral IV 02/08/23 20 G Right Antecubital 02/08/23  1315  Antecubital  4   Peripheral IV 02/09/23 22 G Anterior;Right Forearm 02/09/23  1000  Forearm  3   Colostomy LUQ 07/15/17  1532  LUQ  2038   Hemodialysis Cath Triple Lumen 02/09/23 Jugular 02/09/23  1400  --  3             Labs, radiology images, medication and microbiology have been reviewed and can be viewed in the EMR.  Pertinent findings discussed below or in the assessment and plan.  RADS:  CT Head WO 1/3: IMPRESSION: No acute intracranial abnormality. However, if there is clinical concern for acute ischemia, MRI could further  evaluate as CT is relatively insensitive for the detection of an acute infarct in the first 24 to 48 hours.  US  Renal Bilateral Service 1/3: IMPRESSION: 1.  Moderate right hydronephrosis. 2.  Diffusely increased echogenicity of the right kidney, which can be seen in medical renal disease. 3.   Markedly limited evaluation of the atrophic left kidney.  CT Stone Search WO 1/4: IMPRESSION: 1.  Redemonstrated left-sided nonobstructing renal calculus. No hydronephrosis bilaterally. 2.  Chronic/stable ancillary findings as above.   Assessment/Plan:  02/13/2023 Lyllian Gause is a 63 y.o. female with PMHx sig for bladder exstrophy s/p Boyce-vest procedure (uretero-sigmoidostomy with diverting colostomy), history of frequent urinary tract infections, history of nephrolithiasis requiring surgical extraction (found to be 20% calcium carbonate-phosphate, 80% struvite), CKD stage IV, chronic non-anion gap metabolic acidosis, hypertension who presents to the [facility] emergency department on 1/30 with 3 to 4 days of headache and blurry vision, found to be in hypertensive emergency.  #Hypertensive emergency -- Patient noted to have MAPs in the 150s on arrival, BP 250/118 -- Home antihypertensive regimen is lisinopril 5 mg daily. Patient did not tolerate minoxidil 2.5 mg due to increased heart rate and palpitations (listed on home medication list). -- Multiple signs of endorgan damage, including headache with visual disturbances and AKI with creatinine > 5 from baseline of 2-3 -- EKG nonischemic, no evidence of ST elevations -- Patient's blood pressures have been labile. She has multiple allergies to antihypertensive agents, making it difficult to choose an appropriate regimen. BP appears to be improving with up titration of labetalol . Now that she is receiving HD, can consider restarting lisinopril. PLAN: - Continue Labetalol  300 mg BID - Avoid IV hydralazine  - Goal systolic BP < 160 - Hold home lisinopril given AKI and hyperkalemia, can consider restarting if needed as patient receiving HD - F/u metanephrine urine - Stable for bed board to Nephrology   #AKI on CKD stage IV, suspected secondary to hypertensive emergency #Concern for progression to stage V #Hyperkalemia #Chronic  NAGMA -- Patient has known history of bladder exstrophy s/p Boyce-vest procedure  -- Noted history of frequent urinary tract infections, history of nephrolithiasis requiring surgical extraction (found to be 20% calcium carbonate-phosphate, 80% struvite) -- Patient contacted her outpatient nephrologist given concern for headaches and high blood pressure. Was referred to the ED given elevated creatinine to 4.52 from baseline of 2-3 and hyperkalemia to 6.2. -- Hyperkalemia was temporized in the emergency department with insulin , dextrose , calcium gluconate. No EKG changes were noted. -- Renal ultrasound showed hydronephrosis. CT stone search did not show hydronephrosis. Left-sided nonobstructing renal calculus seen. -- Given worsening hyperkalemia, uremia, rising Cr, and low UOP, suspect renal failure. Nephrology consulted. RIJ placed for HD. Patient received first session of HD 1/5, with 1L removed. -- Etiology of renal failure thought to be secondary to underlying chronic kidney disease with exacerbation in the setting of severe hypertension.  -- Urology consulted who did not recommend any urologic interventions at this time.  Appreciate recommendations. -- Nephrology planning to continue HD outpt. PLAN: - Nephrology consulted, appreciate recommendations on further HD plans and renal optimization - HD per Nephrology - [facility] cath scheduled for Thursday 1/9 with IR, NPO at midnight and hold DVT ppx - Goal potassium < 5, temporize as needed - Strict I&Os, daily weights, low potassium diet - Trend UOP - Continue sodium bicarb tablets - No indication for urine cultures at this time given prior Boyce-vest procedure  - Outpatient HD chair will  be arranged with [facility] per Nephrology  #Anemia of chronic disease likely d/t CKD -- Patient with drop of Hgb from 9.3 on admission to 7.5. -- Patient has been hospitalized and getting frequent blood draws and also has chronic renal disease. -- No  signs of overt bleeding. She had a nose bleed that was controlled. -- Anemia profile consistent with anemia of chronic disease. PLAN: - Plan to transfuse 1 unit pRBCs this morning with repeat CBC after transfusion - Follow CBC, goal hemoglobin > 7, transfuse as needed - Check vitamin B12, folate, reticulocyte count, haptoglobin - Nephrology planning to start ESA when hypertension has improved  #Headaches, suspected secondary to hypertensive emergency, resolved  #Visual disturbances, suspected secondary to hypertensive emergency, resolved  -- Patient presented to the emergency department with 3 to 4 days of headache accompanied by blurry vision. -- No focal neurological deficits, patient has no prior neurological history. PLAN: - qshift neurochecks - Hypertensive control as above  #Secondary hyperparathyroidism d/t CKD #Vitamin D  deficiency -- Elevated PTH at 492 -- Vitamin D  <8.0 PLAN: - Plan for VDA with HD per Nephrology - Vitamin D  supplementation  Feeding: PO Analgesia: Tylenol  PRN  Sedation: None Thromboembolic ppx: Heparin  ppx HOB elevation: Yes Ulcer (stress) ppx: N/A Glycemic protocol: Hypoglycemia protocol Bowel regimen: PRN Indwelling catheter removal: pIV and +RIJ De-escalation of abx: N/A Family: Bedside    Electronically signed by: Jonetta Vincente Revels, MD 02/13/2023 6:28 AM

## 2023-02-14 NOTE — Nursing Note (Signed)
 Patient is set up for outpatient hemodialysis at [facility]. Her schedule is every Tues. and Sat. at 10 am. She is aware of her schedule and voiced understanding. An appointment card was given to her.

## 2023-02-14 NOTE — Progress Notes (Signed)
 ------------------------------------------------------------------------------- Attestation signed by Krystal Lemond Essex, MD at 02/14/2023  4:23 PM I have personally seen and evaluated the patient. I agree with the findings and plan as documented by the resident, with the following additions/changes:      Krystal Lemond Essex, MD  -------------------------------------------------------------------------------  NEPHROLOGY ICU Progress Note: Acute Kidney Injury/Acute Renal Failure  Date of service: 02/14/2023  INTERVAL: No acute events overnight  Had PermCath placed this morning 1/9 with IR.  Vas-Cath was removed. Patient still making urine.  Sister at bedside.  Complains of some soreness around the [facility] site.  SYNOPSIS (from initial consult): Jessica Patterson is a 63 y.o. 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 She had been advised to go to ED due outpatient labs showing AKI on CKD w/ hyperkalemia. Her BL Cr has been progressively increasing over the last year from 2.7 to 3.4. On  admission, her Cr was 4.52 which increased to 4.93 over the ensuing 24 hour interval. She also had recurrent hyperkalemia despite potassium binder and temporization as well as metabolic acidosis w/ bicarb to 13. UOP 750 mL yesterday.   On renal US  patient has atrophic L kidney and moderate right hydronephrosis w/ increased echogenecity. Follow CT without hydronephrosis and bilateral cortical scarring.  Given the patient's recurrent hyperkalemia, acidosis, rising creatinine on eGFR baseline of 15 as well as evidence of bilateral tissue scarring, hemodialysis was recommended to the patient, to which the patient consented.    CURRENT MEDICATIONS: amLODIPine, 10 mg, oral, Daily busPIRone , 5 mg, oral, BID darbepoetin alfa, 25 mcg, subcutaneous, Weekly ergocalciferol , 50,000 Units, oral, Weekly folic acid , 400 mcg, oral, Daily [Held by provider] heparin  (porcine), 5,000 Units, subcutaneous, Q8H SCH labetaloL , 300 mg, oral, Q12H SCH lactulose, 20 g, oral, Daily    PHYSICAL EXAMINATION: Vitals:   02/14/23 0838 02/14/23 0840 02/14/23 0903 02/14/23 1135  BP: (!) 164/104 (!) 169/97 (!) 181/86 (!) 180/86  BP Location:  Right arm Left arm Left arm  Patient Position:   Lying Lying  Pulse: 66 65 64 61  Resp:   16 18  Temp:   97.5 F (36.4 C) 97.3 F (36.3 C)  TempSrc:   Oral Oral  SpO2: 96% 95% 96%   Weight:      Height:       Constitutional: Alert, no apparent distress, sitting up in bedside chair  Cardiovascular: Regular rhythm, normal s1/s2 heart sounds, no murmurs, no rubs, no edema Pulmonary: clear on auscultation, normal respiratory effort on room air  Gastrointestinal: Active bowel sounds. Abdomen soft, non-tender, non-distended, colostomy in place  Musculoskeletal: No active synovitis, no joint deformities Skin: no rash, no induration on extremities Psychiatric: Normal eye contact and language, affect is appropriate Dialysis Access: RIJ PC placed 1/9  LABS: Recent Labs    02/12/23 0038 02/12/23 0054 02/12/23 0552 02/12/23 1150 02/12/23 1754 02/13/23 0048 02/13/23 1318 02/14/23 1051  NA  --  134*   < > 135* 133* 137  135*  --  133*  K  --  3.8   < > 5.0 5.0 4.7  4.7  --  5.3*  CL  --  100   < > 101 101 103  102  --  105  CO2  --  27   < > 27 25 26  25   --  20*  BUN  --  27*   < > 31* 37* 43*  43*  --  55*  CREATININE  --  3.30*   < > 3.99* 4.70* 4.78*  4.79*  --  5.51*  CALCIUM  --  8.4*   < > 8.1* 8.1* 8.4*  8.4*  --  8.3*  PHOS  --  3.9  --   --   --  5.4*  --  5.8*  ALBUMIN  --  3.3*  --   --   --  3.0*  --  3.3*  MG  --  1.9  --   --   --  2.0  --  2.0  WBC 6.20  --   --    --   --  6.20  --  6.00  HGB 7.6*  --   --   --   --  6.9* 8.3* 8.9*  PLT 123*  --   --   --   --  127*  --  140*   < > = values in this interval not displayed.   Albumin  Date Value Ref Range Status  02/14/2023 3.3 (L) 3.5 - 5.7 g/dL Final   External Albumin  Date Value Ref Range Status  02/01/2023 3.5 g/dL Final   No results found for: BNP  PERTINENT WORKUP: Radiology Results (last 72 hours)     Procedure Component Value Units Date/Time   IR Permacath Placement [[id number]] Collected: 02/14/23 1155   Order Status: Completed Updated: 02/14/23 1200   Narrative:     IR PERMACATH PLACEMENT, 02/14/2023 8:35 AM  INDICATION & HISTORY: Dialysis access ,  ATTENDING PHYSICIAN: Norleen Gill, MD  RESIDENT PHYSICIAN: None.  APP: Burnard Level, PA  PROCEDURES: 1.  Local diagnostic ultrasound of the right neck and chest. 2.  Ultrasound-guided central venous access. 3.  Tunneled central venous catheter placement.  FINDINGS: 1.  The right internal jugular vein is patent. 2.  The tip of the catheter is positioned in the right atrium.     Impression:     CONCLUSION: Successful placement of a 19 cm Palindrome dialysis catheter via the right internal jugular vein.    PLAN: The catheter is ready for immediate use.    DETAILS OF PROCEDURE:  Informed consent was obtained after a discussion of the risks, benefits, and alternatives. Ultrasound of the right neck was first performed to demonstrate patency of the internal jugular vein. A representative image was stored. The neck and chest were then prepped and draped in the usual sterile fashion. Maximum sterile barriers were used, including cap, mask, hand hygiene, sterile gloves, sterile gown, large sterile drape, and 2% chlorhexidine  for cutaneous antisepsis. A standardized time-out was then performed in accordance with the Universal Protocol guidelines to confirm the correct patient, operative site, and procedure.  A venotomy site  was selected superior to the clavicle and lateral to the internal jugular vein and anesthetized with 1% lidocaine . Using ultrasound guidance, a micropuncture kit was used to access the vein. A guidewire was then advanced centrally using fluoroscopic guidance. The intravascular length from the access site to the right atrium was measured.   Next, the catheter exit site in the right chest wall was selected inferior to the clavicle. The overlying skin and planned subcutaneous tract were infiltrated with 1% lidocaine  for local anesthesia. A Palindrome dialysis catheter was tunneled to the venotomy site. Serial dilations were performed  over the guidewire, and a peel-away sheath was placed. The tunneled catheter was advanced into the right atrium through the peel-away sheath under fluoroscopy. A final radiograph was acquired.   Finally, the catheter was capped, aspirated, and flushed with normal saline. The catheter was secured to the skin with 2-0 Ethilon. The venotomy was closed with Dermabond. The catheter skin exit site and venotomy were covered with a sterile dressing.   COMPLICATIONS: No immediate complications. The patient tolerated the procedure well and left the interventional radiology suite in stable condition.  ESTIMATED BLOOD LOSS: Less than 10 mL.  CONTRAST USED: None.  FLUOROSCOPY TIME / CUMULATIVE DOSE: 0.7 minutes / 7 mGy.   SEDATION: Moderate sedation.  A pre-procedure assessment included review of the history & physical and relevant diagnostic tests, ensuring that the patient was an appropriate candidate for moderate sedation. An independent, trained nurse Rick Franks, RN) administered the IV medication required for sedation while monitoring continuous vital signs and cardiorespiratory function. This RN was present during the entire procedure under the supervision of the attending physician, Dr. Clive. The patient returned to baseline mental status prior to transfer from the department.  Clinical and physiologic documentation from this procedure is available within the electronic medical record. The patient's age, active monitoring times, and total medication dosages are as follows:  *  Patient age: 26 years *  Moderate sedation start time: 53 *  Moderate sedation end time: 0825 *  Total intraservice face-to-face sedation time: 27 minutes  MEDICATIONS: Versed  2 mg IV. Fentanyl  100 mcg IV.   ANTIBIOTICS: See separate Nursing/Anesthesia documentation.    *Structured report code: IR.2.1      IMPRESSION/PLAN: # Presenting illness/working diagnosis: Non-oliguric AKI on CKD4/5 vs progression to ESRD Uncontrolled hypertension Non-anion gap metabolic acidosis Hyperkalemia resistant to medical management  # Nonoliguric Acute kidney injury/acute renal failure on CKD4/5 vs CKD 5 progression Recent Labs    02/12/23 1150 02/12/23 1754 02/13/23 0048 02/14/23 1051  BUN 31* 37* 43*  43* 55*  CREATININE 3.99* 4.70* 4.78*  4.79* 5.51*  EGFR 12* 10* 10*  10* 8*    Renal Indices:  BL Cr 2.5-2.8mg  since 1/23, most recently 3-3.4mg /dl, admit cr 5.47, peak cr 4.93 Urine Studies:  UA  -none recently  ; UPC -22g Imaging Studies:  sono 10.1/7.1cm kidney, mod rt hydro, CT with lf nonobs 1cm lf calculus. No hydro Other Studies: na   Etiology of AKI:   Suspect likely progression to CKD5 given cr 9/24 with egfr ~87ml/min.  Patient has longstanding CKD w/ atrophic L kidney and extensive bilateral cortical scarring favoring progression to ESRD. However potential AKI component d/t hypertensive emergency/ischemic damage.  Additionally, sono with mod hydro. Though CT does not demonstrate this, would review with GU   Renal Course:  1/4: initiated iHD for clearance in setting of hyperkalemia/acidosis  #Electrolytes Recent Labs    02/12/23 0054 02/12/23 0552 02/12/23 1150 02/12/23 1754 02/13/23 0048 02/14/23 1051  NA 134*   < > 135* 133* 137  135* 133*  K 3.8   < > 5.0 5.0  4.7  4.7 5.3*  MG 1.9  --   --   --  2.0 2.0   < > = values in this interval not displayed.   Sodium: Acceptable Potassium: Acceptable Magnesium :Acceptable  # BMM  Recent Labs    02/12/23 0054 02/12/23 0552 02/12/23 1150 02/12/23 1754 02/13/23 0048 02/14/23 1051  CALCIUM 8.4*   < > 8.1* 8.1* 8.4*  8.4* 8.3*  PHOS  3.9  --   --   --  5.4* 5.8*  ALBUMIN 3.3*  --   --   --  3.0* 3.3*   < > = values in this interval not displayed.   Lab Results  Component Value Date   PTH 492 (H) 02/10/2023   PTH 149 (H) 07/18/2022   VITD <8.0 (L) 02/10/2023   VITD 20.5 (L) 07/18/2022   Calcium: Acceptable when corrected for albumin Phosphorus: Hyperphosphatemia, mild  # Acid -Base status Recent Labs    02/12/23 1150 02/12/23 1754 02/13/23 0048 02/14/23 1051  CO2 27 25 26  25  20*  ANIONGAP 7 7 8  8 8    Acceptable  # Hematology Recent Labs    02/12/23 0038 02/13/23 0048 02/13/23 1318 02/14/23 1051  WBC 6.20 6.20  --  6.00  HGB 7.6* 6.9* 8.3* 8.9*  PLT 123* 127*  --  140*   Lab Results  Component Value Date   TRANSSAT 28 02/10/2023   FERRITIN 104 02/10/2023   IRON 64 02/10/2023   Anemia  # Volume status:  Euvolemia # Blood pressure: Normotensive  # Medication dosing: Reviewed for current GFR, see any recommended changes below. # Dialysis consent: 02/09/23, during initial consult Nephrology team discussed that this will likely be chronic dialysis given persistent hyperkalemia not responsive to medical management. Follows closely with Dr. Fenton    RECOMMENDATIONS Received IHD on 1/4, 1/6 via Vas-Cath. PermCath placed 1/9. Has confirmed outpatient chair at [facility] at 10 am. She can start on Tues., 1/14.  -No urgent indication for RRT today, plan for IHD tomorrow 1/10 -Currently adjusting outpatient schedule to twice a week due residual renal function -PTH elevated in line with secondary HPT d/t CKD progression. Plan for VDA with HD.  -Vitamin D   supplementation  -Weekly Aranesp; can stop on discharge  HTN: patient  noted to have multiple issues with antihypertensives - Now only on labetalol  300 mg BID  Low potassium diet   -Daily standing weights (if able)  -Strict I&Os  -Avoid lovenox , NSAIDS, morphine, Fleet's enema, regular insulin   -Judicious use of IV contrast  -Midlines/PICC should be avoided in CKD4/5 and ESRD patients to preserve future vascular access sites     Please page the covering provider for the [facility] service in SecureChat with any questions regarding this patient.   Electronically signed by: Wilkie Lucks, DO 02/14/2023 1:07 PM

## 2023-03-22 ENCOUNTER — Inpatient Hospital Stay: Payer: 59

## 2023-03-22 ENCOUNTER — Encounter: Payer: Self-pay | Admitting: Oncology

## 2023-03-22 ENCOUNTER — Inpatient Hospital Stay: Payer: No Typology Code available for payment source | Attending: Oncology | Admitting: Oncology

## 2023-03-22 VITALS — BP 164/81 | HR 63 | Temp 98.1°F | Resp 18 | Wt 132.0 lb

## 2023-03-22 DIAGNOSIS — Z79899 Other long term (current) drug therapy: Secondary | ICD-10-CM | POA: Insufficient documentation

## 2023-03-22 DIAGNOSIS — F411 Generalized anxiety disorder: Secondary | ICD-10-CM

## 2023-03-22 DIAGNOSIS — I120 Hypertensive chronic kidney disease with stage 5 chronic kidney disease or end stage renal disease: Secondary | ICD-10-CM | POA: Insufficient documentation

## 2023-03-22 DIAGNOSIS — D509 Iron deficiency anemia, unspecified: Secondary | ICD-10-CM

## 2023-03-22 DIAGNOSIS — E878 Other disorders of electrolyte and fluid balance, not elsewhere classified: Secondary | ICD-10-CM | POA: Diagnosis not present

## 2023-03-22 DIAGNOSIS — Z992 Dependence on renal dialysis: Secondary | ICD-10-CM | POA: Diagnosis not present

## 2023-03-22 DIAGNOSIS — F419 Anxiety disorder, unspecified: Secondary | ICD-10-CM | POA: Insufficient documentation

## 2023-03-22 DIAGNOSIS — D631 Anemia in chronic kidney disease: Secondary | ICD-10-CM | POA: Diagnosis present

## 2023-03-22 DIAGNOSIS — N186 End stage renal disease: Secondary | ICD-10-CM | POA: Insufficient documentation

## 2023-03-22 DIAGNOSIS — Z801 Family history of malignant neoplasm of trachea, bronchus and lung: Secondary | ICD-10-CM | POA: Insufficient documentation

## 2023-03-22 LAB — CBC WITH DIFFERENTIAL (CANCER CENTER ONLY)
Abs Immature Granulocytes: 0.02 10*3/uL (ref 0.00–0.07)
Basophils Absolute: 0.1 10*3/uL (ref 0.0–0.1)
Basophils Relative: 1 %
Eosinophils Absolute: 0.6 10*3/uL — ABNORMAL HIGH (ref 0.0–0.5)
Eosinophils Relative: 9 %
HCT: 23.1 % — ABNORMAL LOW (ref 36.0–46.0)
Hemoglobin: 7.3 g/dL — ABNORMAL LOW (ref 12.0–15.0)
Immature Granulocytes: 0 %
Lymphocytes Relative: 21 %
Lymphs Abs: 1.5 10*3/uL (ref 0.7–4.0)
MCH: 30.2 pg (ref 26.0–34.0)
MCHC: 31.6 g/dL (ref 30.0–36.0)
MCV: 95.5 fL (ref 80.0–100.0)
Monocytes Absolute: 0.4 10*3/uL (ref 0.1–1.0)
Monocytes Relative: 6 %
Neutro Abs: 4.5 10*3/uL (ref 1.7–7.7)
Neutrophils Relative %: 63 %
Platelet Count: 181 10*3/uL (ref 150–400)
RBC: 2.42 MIL/uL — ABNORMAL LOW (ref 3.87–5.11)
RDW: 14.6 % (ref 11.5–15.5)
WBC Count: 7.1 10*3/uL (ref 4.0–10.5)
nRBC: 0 % (ref 0.0–0.2)

## 2023-03-22 LAB — RETIC PANEL
Immature Retic Fract: 6.6 % (ref 2.3–15.9)
RBC.: 2.38 MIL/uL — ABNORMAL LOW (ref 3.87–5.11)
Retic Count, Absolute: 28.1 10*3/uL (ref 19.0–186.0)
Retic Ct Pct: 1.2 % (ref 0.4–3.1)
Reticulocyte Hemoglobin: 32.3 pg (ref >27.9)

## 2023-03-22 LAB — IRON AND TIBC
Iron: 56 ug/dL (ref 28–170)
Saturation Ratios: 18 % (ref 10.4–31.8)
TIBC: 321 ug/dL (ref 250–450)
UIBC: 265 ug/dL

## 2023-03-22 LAB — FERRITIN: Ferritin: 162 ng/mL (ref 11–307)

## 2023-03-22 NOTE — Assessment & Plan Note (Signed)
Refer to [facility].

## 2023-03-22 NOTE — Progress Notes (Signed)
 Hematology/Oncology Consult note [phone] [phone]        REFERRING PROVIDER: Mosetta Pigeon, MD   CHIEF COMPLAINTS/REASON FOR VISIT:  Evaluation of anemia due to ESRD   ASSESSMENT & PLAN:   Anemia in ESRD (end-stage renal disease) (HCC) Labs are reviewed and discussed with patient. Lab Results  Component Value Date   HGB 7.3 (L) 03/22/2023   TIBC 321 03/22/2023   IRONPCTSAT 18 03/22/2023   FERRITIN 162 03/22/2023    Patient reports reaction to Mircera (shortness of breath, chest pain), symptoms resolved with nasal cannula oxygen, with no additional needs of medication. She has history of allergic reaction to multiple medications. No documented anaphylactic reactions.  -Plan trial of alternative EPO with premedication and close monitoring for potential allergic reaction. She understands that there may be cross reaction activity to alternative agents. She is willing to try other agents.  Plan Retacrit 40,000 units  -If severe reaction occurs again, will need to consider blood transfusions as alternative.  Anxiety Refer to [facility]   Orders Placed This Encounter  Procedures   CBC with Differential (Cancer Center Only)    Standing Status:   Future    Number of Occurrences:   1    Expected Date:   03/22/2023    Expiration Date:   03/21/2024   Iron and TIBC    Standing Status:   Future    Number of Occurrences:   1    Expected Date:   03/22/2023    Expiration Date:   03/21/2024   Ferritin    Standing Status:   Future    Number of Occurrences:   1    Expected Date:   03/22/2023    Expiration Date:   03/21/2024   Retic Panel    Standing Status:   Future    Number of Occurrences:   1    Expected Date:   03/22/2023    Expiration Date:   03/21/2024   Ambulatory Referral to [facility]    Referral Priority:   Routine    Referral Type:   Consultation    Referral Reason:   Specialty Services Required    Requested Specialty:   Oncology    Number of  Visits Requested:   1    All questions were answered. The patient knows to call the clinic with any problems, questions or concerns.  Rickard Patience, MD, PhD [facility] Hematology Oncology 03/22/2023   HISTORY OF PRESENTING ILLNESS:   Jessica Patterson is a  63 y.o.  female with PMH listed below was seen in consultation at the request of  Mosetta Pigeon, MD  for evaluation of anemia due to ESRD She started hemodialysis on February 09, 2023, attending sessions twice a week. She has received EPO replacement with Mircera and was noted to have adverse reaction. Per patient, the reactions include difficulty breathing and a sensation of 'everything closing up,' occurring both in the hospital and at the [facility] dialysis center. These episodes last five to eight minutes and are relieved with oxygen, but no medications like epinephrine or steroids were administered.  Recent hospitalization due to worsening kidney function, uncontrolled HTN, and electrolyte imbalance. Was put in the ICU. Started dialysis while in the hospital (x3), and now on schedule of Tuesday/Saturday   She has a long-standing history of medical issues, including being born with exstrophy of bladder, requiring multiple surgeries. She has been under psychiatric care since a young age due to these medical challenges and  is currently on Buspar and Atarax for anxiety, which helps her sleep.      MEDICAL HISTORY:  Past Medical History:  Diagnosis Date   CKD (chronic kidney disease), stage IV (HCC)    Hyperlipidemia    Hypertension    Kidney stone    Recurrent UTI    Seasonal allergies     SURGICAL HISTORY: Past Surgical History:  Procedure Laterality Date   ABDOMINAL HERNIA REPAIR N/A    ABDOMINAL HYSTERECTOMY N/A    COLOSTOMY N/A    NEPHROSTOMY TUBE PLACEMENT ([facility] HX) N/A     SOCIAL HISTORY: Social History   Socioeconomic History   Marital status: Single    Spouse name: Not on file   Number of children: Not on file    Years of education: Not on file   Highest education level: Not on file  Occupational History   Not on file  Tobacco Use   Smoking status: Never   Smokeless tobacco: Never  Vaping Use   Vaping status: Never Used  Substance and Sexual Activity   Alcohol use: Never   Drug use: Not on file   Sexual activity: Not on file  Other Topics Concern   Not on file  Social History Narrative   Not on file   Social Drivers of Health   Financial Resource Strain: Not on file  Food Insecurity: Low Risk  (02/11/2023)   Received from [facility]   Hunger Vital Sign    Worried About Running Out of Food in the Last Year: Never true    Ran Out of Food in the Last Year: Never true  Transportation Needs: No Transportation Needs (02/11/2023)   Received from [facility]    In the past 12 months, has lack of reliable transportation kept you from medical appointments, meetings, work or from getting things needed for daily living? : No  Physical Activity: Not on file  Stress: Not on file  Social Connections: Not on file  Intimate Partner Violence: Not on file    FAMILY HISTORY: Family History  Problem Relation Age of Onset   Lung cancer Mother    Stroke Father    Breast cancer Neg Hx     ALLERGIES:  is allergic to amlodipine, cephalosporins, codeine, hydrochlorothiazide, hydromorphone, latex, losartan potassium, metoprolol, doxycycline, egg-derived products, sucralfate, cholecalciferol, ciprofloxacin, clonidine hcl, doxazosin mesylate, hydralazine hcl, iodine-131, ivp dye [iodinated contrast media], nifedipine, ranitidine hcl, sulfa antibiotics, cefuroxime axetil, diltiazem hcl, isosorbide, morphine, penicillin g, and prazosin.  MEDICATIONS:  Current Outpatient Medications  Medication Sig Dispense Refill   acetaminophen (TYLENOL) 325 MG tablet Take 650 mg by mouth every 6 (six) hours as needed.     amLODipine (NORVASC) 10 MG tablet Take 1 tablet by mouth daily.     busPIRone  (BUSPAR) 5 MG tablet Take 5 mg by mouth 2 (two) times daily.     cetirizine (ZYRTEC) 10 MG tablet Take 10 mg by mouth daily.     cyclobenzaprine (FLEXERIL) 10 MG tablet Take 10 mg by mouth at bedtime as needed.     furosemide (LASIX) 40 MG tablet 40 mg. 40 mg on non hemodialysis days     hydrOXYzine (ATARAX) 25 MG tablet Take 25 mg by mouth every 6 (six) hours as needed.     labetalol (NORMODYNE) 300 MG tablet Take 300 mg by mouth 2 (two) times daily.     sodium bicarbonate 650 MG tablet Take 3 tablets (1,950 mg total) by mouth 3 times  daily.     fluticasone (FLONASE) 50 MCG/ACT nasal spray Place into both nostrils daily as needed for allergies or rhinitis. (Patient not taking: Reported on 03/22/2023)     montelukast (SINGULAIR) 10 MG tablet Take 10 mg by mouth as needed for Allergies (Patient not taking: Reported on 03/22/2023)     No current facility-administered medications for this visit.    Review of Systems  Constitutional:  Positive for fatigue. Negative for chills and fever.  HENT:   Negative for hearing loss and voice change.   Eyes:  Negative for eye problems.  Respiratory:  Negative for chest tightness and cough.   Cardiovascular:  Negative for chest pain.  Gastrointestinal:  Negative for abdominal distention, abdominal pain and blood in stool.  Endocrine: Negative for hot flashes.  Genitourinary:  Negative for difficulty urinating and frequency.   Musculoskeletal:  Negative for arthralgias.  Skin:  Negative for itching and rash.  Neurological:  Negative for extremity weakness.  Hematological:  Negative for adenopathy.  Psychiatric/Behavioral:  Negative for confusion. The patient is nervous/anxious.    PHYSICAL EXAMINATION: ECOG PERFORMANCE STATUS: 1 - Symptomatic but completely ambulatory Vitals:   03/22/23 1120  BP: (!) 164/81  Pulse: 63  Resp: 18  Temp: 98.1 F (36.7 C)   Filed Weights   03/22/23 1120  Weight: 132 lb (59.9 kg)    Physical Exam Constitutional:       General: She is not in acute distress. HENT:     Head: Normocephalic and atraumatic.  Eyes:     General: No scleral icterus. Cardiovascular:     Rate and Rhythm: Normal rate and regular rhythm.     Heart sounds: Normal heart sounds.  Pulmonary:     Effort: Pulmonary effort is normal. No respiratory distress.     Breath sounds: No wheezing.  Abdominal:     General: Bowel sounds are normal. There is no distension.     Palpations: Abdomen is soft.  Musculoskeletal:        General: No deformity. Normal range of motion.     Cervical back: Normal range of motion and neck supple.  Skin:    General: Skin is warm and dry.     Findings: No erythema or rash.  Neurological:     Mental Status: She is alert and oriented to person, place, and time. Mental status is at baseline.  Psychiatric:        Mood and Affect: Mood normal.     LABORATORY DATA:  I have reviewed the data as listed    Latest Ref Rng & Units 03/22/2023   12:04 PM 09/22/2019    6:31 AM 09/21/2019    6:04 AM  CBC  WBC 4.0 - 10.5 K/uL 7.1  6.3  8.5   Hemoglobin 12.0 - 15.0 g/dL 7.3  45.4  09.8   Hematocrit 36.0 - 46.0 % 23.1  33.1  34.1   Platelets 150 - 400 K/uL 181  228  223       Latest Ref Rng & Units 09/22/2019    6:31 AM 09/21/2019    6:04 AM 09/20/2019    6:43 AM  CMP  Glucose 70 - 99 mg/dL 119  147  829   BUN 6 - 20 mg/dL 49  45  39   Creatinine 0.44 - 1.00 mg/dL 5.62  1.30  8.65   Sodium 135 - 145 mmol/L 138  140  139   Potassium 3.5 - 5.1 mmol/L 4.1  4.0  4.2  Chloride 98 - 111 mmol/L 113  111  110   CO2 22 - 32 mmol/L 17  18  17    Calcium 8.9 - 10.3 mg/dL 8.4  8.8  8.9   Total Protein 6.5 - 8.1 g/dL 6.4  6.8  7.0   Total Bilirubin 0.3 - 1.2 mg/dL 0.5  0.6  0.5   Alkaline Phos 38 - 126 U/L 80  79  96   AST 15 - 41 U/L 13  15  16    ALT 0 - 44 U/L 10  11  11        RADIOGRAPHIC STUDIES: I have personally reviewed the radiological images as listed and agreed with the findings in the report. No  results found.

## 2023-03-22 NOTE — Assessment & Plan Note (Addendum)
 Labs are reviewed and discussed with patient. Lab Results  Component Value Date   HGB 7.3 (L) 03/22/2023   TIBC 321 03/22/2023   IRONPCTSAT 18 03/22/2023   FERRITIN 162 03/22/2023    Patient reports reaction to Mircera (shortness of breath, chest pain), symptoms resolved with nasal cannula oxygen, with no additional needs of medication. She has history of allergic reaction to multiple medications. No documented anaphylactic reactions.  -Plan trial of alternative EPO with premedication and close monitoring for potential allergic reaction. She understands that there may be cross reaction activity to alternative agents. She is willing to try other agents.  Plan Retacrit 40,000 units  -If severe reaction occurs again, will need to consider blood transfusions as alternative.

## 2023-03-23 ENCOUNTER — Encounter: Payer: Self-pay | Admitting: Oncology

## 2023-03-23 NOTE — Addendum Note (Signed)
 Addended by: Rickard Patience on: 03/23/2023 05:15 PM   Modules accepted: Orders

## 2023-03-25 ENCOUNTER — Telehealth: Payer: Self-pay

## 2023-03-25 NOTE — Telephone Encounter (Signed)
 Spoke to pt and informed her of MD recommendation and follow up plan. Pt verbalized understanding.   Please contact pt to schedule:  Retacrit with Premeds this week  2 weeks with lab MD +/-retacrit (with premeds)

## 2023-03-25 NOTE — Telephone Encounter (Signed)
-----   Message from Jessica Patterson sent at 03/23/2023  5:15 PM EST ----- Please arrange patient to get retacrit with premed medication.  She has a history of reaction to other EPO agent so need to monitored for 15 mins after injection.

## 2023-03-29 ENCOUNTER — Inpatient Hospital Stay: Payer: No Typology Code available for payment source

## 2023-03-29 VITALS — BP 166/83 | HR 63 | Temp 97.3°F | Resp 16

## 2023-03-29 DIAGNOSIS — D631 Anemia in chronic kidney disease: Secondary | ICD-10-CM

## 2023-03-29 DIAGNOSIS — I120 Hypertensive chronic kidney disease with stage 5 chronic kidney disease or end stage renal disease: Secondary | ICD-10-CM | POA: Diagnosis not present

## 2023-03-29 MED ORDER — EPOETIN ALFA-EPBX 40000 UNIT/ML IJ SOLN
40000.0000 [IU] | Freq: Once | INTRAMUSCULAR | Status: AC
  Administered 2023-03-29: 40000 [IU] via SUBCUTANEOUS
  Filled 2023-03-29: qty 1

## 2023-03-29 MED ORDER — DEXAMETHASONE 4 MG PO TABS
8.0000 mg | ORAL_TABLET | Freq: Once | ORAL | Status: AC
Start: 2023-03-29 — End: 2023-03-29
  Administered 2023-03-29: 8 mg via ORAL
  Filled 2023-03-29: qty 2

## 2023-03-29 MED ORDER — DIPHENHYDRAMINE HCL 25 MG PO CAPS
50.0000 mg | ORAL_CAPSULE | ORAL | Status: DC | PRN
  Administered 2023-03-29: 50 mg via ORAL
  Filled 2023-03-29: qty 2

## 2023-03-29 MED ORDER — DEXAMETHASONE SODIUM PHOSPHATE 10 MG/ML IJ SOLN: 10.0000 mg | INTRAMUSCULAR | Status: DC | PRN

## 2023-03-29 NOTE — Progress Notes (Signed)
 Pt tolerated retacrit injection without concerns.  VSS.  Pt monitored 15 minutes post injection.

## 2023-03-29 NOTE — Patient Instructions (Signed)

## 2023-04-12 ENCOUNTER — Inpatient Hospital Stay: Payer: 59

## 2023-04-12 ENCOUNTER — Encounter: Payer: Self-pay | Admitting: Oncology

## 2023-04-12 ENCOUNTER — Inpatient Hospital Stay (HOSPITAL_BASED_OUTPATIENT_CLINIC_OR_DEPARTMENT_OTHER): Payer: 59 | Admitting: Oncology

## 2023-04-12 ENCOUNTER — Inpatient Hospital Stay: Payer: 59 | Attending: Oncology

## 2023-04-12 VITALS — BP 213/110 | HR 69 | Temp 98.1°F | Resp 18 | Wt 129.2 lb

## 2023-04-12 DIAGNOSIS — F419 Anxiety disorder, unspecified: Secondary | ICD-10-CM

## 2023-04-12 DIAGNOSIS — Z992 Dependence on renal dialysis: Secondary | ICD-10-CM | POA: Insufficient documentation

## 2023-04-12 DIAGNOSIS — D631 Anemia in chronic kidney disease: Secondary | ICD-10-CM

## 2023-04-12 DIAGNOSIS — I10 Essential (primary) hypertension: Secondary | ICD-10-CM | POA: Diagnosis not present

## 2023-04-12 DIAGNOSIS — N186 End stage renal disease: Secondary | ICD-10-CM | POA: Diagnosis present

## 2023-04-12 DIAGNOSIS — Z79899 Other long term (current) drug therapy: Secondary | ICD-10-CM | POA: Diagnosis not present

## 2023-04-12 DIAGNOSIS — Z801 Family history of malignant neoplasm of trachea, bronchus and lung: Secondary | ICD-10-CM | POA: Insufficient documentation

## 2023-04-12 DIAGNOSIS — R5383 Other fatigue: Secondary | ICD-10-CM | POA: Diagnosis not present

## 2023-04-12 DIAGNOSIS — I120 Hypertensive chronic kidney disease with stage 5 chronic kidney disease or end stage renal disease: Secondary | ICD-10-CM | POA: Insufficient documentation

## 2023-04-12 LAB — CBC WITH DIFFERENTIAL/PLATELET
Abs Immature Granulocytes: 0.11 10*3/uL — ABNORMAL HIGH (ref 0.00–0.07)
Basophils Absolute: 0.1 10*3/uL (ref 0.0–0.1)
Basophils Relative: 1 %
Eosinophils Absolute: 0.7 10*3/uL — ABNORMAL HIGH (ref 0.0–0.5)
Eosinophils Relative: 7 %
HCT: 27.4 % — ABNORMAL LOW (ref 36.0–46.0)
Hemoglobin: 8.9 g/dL — ABNORMAL LOW (ref 12.0–15.0)
Immature Granulocytes: 1 %
Lymphocytes Relative: 19 %
Lymphs Abs: 1.7 10*3/uL (ref 0.7–4.0)
MCH: 30.9 pg (ref 26.0–34.0)
MCHC: 32.5 g/dL (ref 30.0–36.0)
MCV: 95.1 fL (ref 80.0–100.0)
Monocytes Absolute: 0.4 10*3/uL (ref 0.1–1.0)
Monocytes Relative: 4 %
Neutro Abs: 5.9 10*3/uL (ref 1.7–7.7)
Neutrophils Relative %: 68 %
Platelets: 157 10*3/uL (ref 150–400)
RBC: 2.88 MIL/uL — ABNORMAL LOW (ref 3.87–5.11)
RDW: 15.8 % — ABNORMAL HIGH (ref 11.5–15.5)
WBC: 8.7 10*3/uL (ref 4.0–10.5)
nRBC: 0 % (ref 0.0–0.2)

## 2023-04-12 LAB — RETIC PANEL
Immature Retic Fract: 4.7 % (ref 2.3–15.9)
RBC.: 2.86 MIL/uL — ABNORMAL LOW (ref 3.87–5.11)
Retic Count, Absolute: 42.9 10*3/uL (ref 19.0–186.0)
Retic Ct Pct: 1.5 % (ref 0.4–3.1)
Reticulocyte Hemoglobin: 32 pg (ref >27.9)

## 2023-04-12 NOTE — Progress Notes (Signed)
 Hematology/Oncology Consult note [phone] [phone]        REFERRING PROVIDER: Resa Miner, MD   CHIEF COMPLAINTS/REASON FOR VISIT:  Evaluation of anemia due to ESRD   ASSESSMENT & PLAN:   Anemia in ESRD (end-stage renal disease) (HCC) Labs are reviewed and discussed with patient. Lab Results  Component Value Date   HGB 8.9 (L) 04/12/2023   TIBC 321 03/22/2023   IRONPCTSAT 18 03/22/2023   FERRITIN 162 03/22/2023    -She tolerated retacrit 40,000 units with premedication very well. Plan retacrit 40,000 units every 4 weeks if H&H <=10. De-escalate premed to benadryl only. Dc dexamethasone as premed She get IV iron treatments with her HD.  Today BP is high, hold off epo and reschedule to next week.   Anxiety Referred to establish care with [facility]  Uncontrolled hypertension Bpis elevated at 213/110.  I recommend patient to go to ER for evaluation and management. She declines.  She just started on Coreg 12.5mg  BID and would like to see if BP will improve with addition of BP meds.  Recommend patient to monitor BP closely at home and ask her to go to ER if persistently high, or if she developing symptoms.     Orders Placed This Encounter  Procedures   Hemoglobin and hematocrit, blood    Standing Status:   Future    Expected Date:   05/13/2023    Expiration Date:   04/11/2024   CBC with Differential (Cancer Center Only)    Standing Status:   Future    Expected Date:   06/10/2023    Expiration Date:   04/11/2024   Ferritin    Standing Status:   Future    Expected Date:   06/10/2023    Expiration Date:   04/11/2024   Retic Panel    Standing Status:   Future    Expected Date:   06/10/2023    Expiration Date:   04/11/2024   Iron and TIBC    Standing Status:   Future    Expected Date:   06/10/2023    Expiration Date:   04/11/2024   Follow up LOS All questions were answered. The patient knows to call the clinic with any problems, questions or  concerns.  Rickard Patience, MD, PhD [facility] Hematology Oncology 04/12/2023   HISTORY OF PRESENTING ILLNESS:   Jessica Patterson is a  63 y.o.  female with PMH listed below was seen in consultation at the request of  Resa Miner, MD  for evaluation of anemia due to ESRD She started hemodialysis on February 09, 2023, attending sessions twice a week. She has received EPO replacement with Mircera and was noted to have adverse reaction. Per patient, the reactions include difficulty breathing and a sensation of 'everything closing up,' occurring both in the hospital and at the [facility] dialysis center. These episodes last five to eight minutes and are relieved with oxygen, but no medications like epinephrine or steroids were administered.  Recent hospitalization due to worsening kidney function, uncontrolled HTN, and electrolyte imbalance. Was put in the ICU. Started dialysis while in the hospital (x3), and now on schedule of Tuesday/Saturday   She has a long-standing history of medical issues, including being born with exstrophy of bladder, requiring multiple surgeries. She has been under psychiatric care since a young age due to these medical challenges and is currently on Buspar and Atarax for anxiety, which helps her sleep.    INTERVAL HISTORY Jessica Patterson  Ashton is a 63 y.o. female who has above history reviewed by me today presents for follow up visit for anemia in ESRD.  S/p retacrit with premed. She tolerated well with no significant side effects. Fatigue is slightly better.  BP is high. Recently started on Coreg.  Denies headache, vision changes, nausea vomiting.   MEDICAL HISTORY:  Past Medical History:  Diagnosis Date   CKD (chronic kidney disease), stage IV (HCC)    Hyperlipidemia    Hypertension    Kidney stone    Recurrent UTI    Seasonal allergies     SURGICAL HISTORY: Past Surgical History:  Procedure Laterality Date   ABDOMINAL HERNIA REPAIR N/A    ABDOMINAL HYSTERECTOMY N/A     COLOSTOMY N/A    NEPHROSTOMY TUBE PLACEMENT ([facility] HX) N/A     SOCIAL HISTORY: Social History   Socioeconomic History   Marital status: Single    Spouse name: Not on file   Number of children: Not on file   Years of education: Not on file   Highest education level: Not on file  Occupational History   Not on file  Tobacco Use   Smoking status: Never   Smokeless tobacco: Never  Vaping Use   Vaping status: Never Used  Substance and Sexual Activity   Alcohol use: Never   Drug use: Not on file   Sexual activity: Not on file  Other Topics Concern   Not on file  Social History Narrative   Not on file   Social Drivers of Health   Financial Resource Strain: Not on file  Food Insecurity: Low Risk  (02/11/2023)   Received from [facility]   Hunger Vital Sign    Worried About Running Out of Food in the Last Year: Never true    Ran Out of Food in the Last Year: Never true  Transportation Needs: No Transportation Needs (02/11/2023)   Received from [facility]    In the past 12 months, has lack of reliable transportation kept you from medical appointments, meetings, work or from getting things needed for daily living? : No  Physical Activity: Not on file  Stress: Not on file  Social Connections: Not on file  Intimate Partner Violence: Not on file    FAMILY HISTORY: Family History  Problem Relation Age of Onset   Lung cancer Mother    Stroke Father    Breast cancer Neg Hx     ALLERGIES:  is allergic to amlodipine, cephalosporins, codeine, hydrochlorothiazide, hydromorphone, latex, losartan potassium, metoprolol, doxycycline, egg-derived products, sucralfate, cholecalciferol, ciprofloxacin, clonidine hcl, doxazosin mesylate, hydralazine hcl, iodine-131, ivp dye [iodinated contrast media], nifedipine, ranitidine hcl, sulfa antibiotics, cefuroxime axetil, diltiazem hcl, isosorbide, morphine, penicillin g, and prazosin.  MEDICATIONS:  Current Outpatient  Medications  Medication Sig Dispense Refill   acetaminophen (TYLENOL) 325 MG tablet Take 650 mg by mouth every 6 (six) hours as needed.     amLODipine (NORVASC) 10 MG tablet Take 1 tablet by mouth daily.     busPIRone (BUSPAR) 5 MG tablet Take 5 mg by mouth 2 (two) times daily.     carvedilol (COREG) 12.5 MG tablet Take 12.5 mg by mouth 2 (two) times daily.     cetirizine (ZYRTEC) 10 MG tablet Take 10 mg by mouth daily.     cyclobenzaprine (FLEXERIL) 10 MG tablet Take 10 mg by mouth at bedtime as needed.     furosemide (LASIX) 40 MG tablet 40 mg. 40 mg on non hemodialysis  days     hydrOXYzine (ATARAX) 25 MG tablet Take 25 mg by mouth every 6 (six) hours as needed.     sodium bicarbonate 650 MG tablet Take 3 tablets (1,950 mg total) by mouth 3 times daily.     fluticasone (FLONASE) 50 MCG/ACT nasal spray Place into both nostrils daily as needed for allergies or rhinitis. (Patient not taking: Reported on 04/12/2023)     montelukast (SINGULAIR) 10 MG tablet Take 10 mg by mouth as needed for Allergies (Patient not taking: Reported on 03/22/2023)     No current facility-administered medications for this visit.    Review of Systems  Constitutional:  Positive for fatigue. Negative for chills and fever.  HENT:   Negative for hearing loss and voice change.   Eyes:  Negative for eye problems.  Respiratory:  Negative for chest tightness and cough.   Cardiovascular:  Negative for chest pain.  Gastrointestinal:  Negative for abdominal distention, abdominal pain and blood in stool.  Endocrine: Negative for hot flashes.  Genitourinary:  Negative for difficulty urinating and frequency.   Musculoskeletal:  Negative for arthralgias.  Skin:  Negative for itching and rash.  Neurological:  Negative for extremity weakness.  Hematological:  Negative for adenopathy.  Psychiatric/Behavioral:  Negative for confusion.    PHYSICAL EXAMINATION: ECOG PERFORMANCE STATUS: 1 - Symptomatic but completely  ambulatory Vitals:   04/12/23 1058 04/12/23 1116  BP: (!) 207/101 (!) 213/110  Pulse: 69   Resp: 18   Temp: 98.1 F (36.7 C)    Filed Weights   04/12/23 1058  Weight: 129 lb 3.2 oz (58.6 kg)    Physical Exam Constitutional:      General: She is not in acute distress. HENT:     Head: Normocephalic and atraumatic.  Eyes:     General: No scleral icterus. Cardiovascular:     Rate and Rhythm: Normal rate and regular rhythm.     Heart sounds: Normal heart sounds.  Pulmonary:     Effort: Pulmonary effort is normal. No respiratory distress.     Breath sounds: No wheezing.  Abdominal:     General: Bowel sounds are normal. There is no distension.     Palpations: Abdomen is soft.  Musculoskeletal:        General: No deformity. Normal range of motion.     Cervical back: Normal range of motion and neck supple.  Skin:    General: Skin is warm and dry.     Findings: No erythema or rash.  Neurological:     Mental Status: She is alert and oriented to person, place, and time. Mental status is at baseline.  Psychiatric:        Mood and Affect: Mood normal.     LABORATORY DATA:  I have reviewed the data as listed    Latest Ref Rng & Units 04/12/2023   10:37 AM 03/22/2023   12:04 PM 09/22/2019    6:31 AM  CBC  WBC 4.0 - 10.5 K/uL 8.7  7.1  6.3   Hemoglobin 12.0 - 15.0 g/dL 8.9  7.3  09.8   Hematocrit 36.0 - 46.0 % 27.4  23.1  33.1   Platelets 150 - 400 K/uL 157  181  228       Latest Ref Rng & Units 09/22/2019    6:31 AM 09/21/2019    6:04 AM 09/20/2019    6:43 AM  CMP  Glucose 70 - 99 mg/dL 119  147  829   BUN 6 -  20 mg/dL 49  45  39   Creatinine 0.44 - 1.00 mg/dL 1.61  0.96  0.45   Sodium 135 - 145 mmol/L 138  140  139   Potassium 3.5 - 5.1 mmol/L 4.1  4.0  4.2   Chloride 98 - 111 mmol/L 113  111  110   CO2 22 - 32 mmol/L 17  18  17    Calcium 8.9 - 10.3 mg/dL 8.4  8.8  8.9   Total Protein 6.5 - 8.1 g/dL 6.4  6.8  7.0   Total Bilirubin 0.3 - 1.2 mg/dL 0.5  0.6  0.5    Alkaline Phos 38 - 126 U/L 80  79  96   AST 15 - 41 U/L 13  15  16    ALT 0 - 44 U/L 10  11  11        RADIOGRAPHIC STUDIES: I have personally reviewed the radiological images as listed and agreed with the findings in the report. No results found.

## 2023-04-12 NOTE — Assessment & Plan Note (Addendum)
 Referred to establish care with [facility] care

## 2023-04-12 NOTE — Assessment & Plan Note (Addendum)
 Bpis elevated at 213/110.  I recommend patient to go to ER for evaluation and management. She declines.  She just started on Coreg 12.5mg  BID and would like to see if BP will improve with addition of BP meds.  Recommend patient to monitor BP closely at home and ask her to go to ER if persistently high, or if she developing symptoms.

## 2023-04-12 NOTE — Assessment & Plan Note (Addendum)
 Labs are reviewed and discussed with patient. Lab Results  Component Value Date   HGB 8.9 (L) 04/12/2023   TIBC 321 03/22/2023   IRONPCTSAT 18 03/22/2023   FERRITIN 162 03/22/2023    -She tolerated retacrit 40,000 units with premedication very well. Plan retacrit 40,000 units every 4 weeks if H&H <=10. De-escalate premed to benadryl only. Dc dexamethasone as premed She get IV iron treatments with her HD.  Today BP is high, hold off epo and reschedule to next week.

## 2023-04-12 NOTE — Progress Notes (Signed)
 Pt here for follow up. Pt reports that she has purple spots on arms since she had the infusion last week. '

## 2023-04-15 ENCOUNTER — Inpatient Hospital Stay

## 2023-04-15 VITALS — BP 161/90 | HR 69 | Temp 98.2°F | Resp 17

## 2023-04-15 DIAGNOSIS — D631 Anemia in chronic kidney disease: Secondary | ICD-10-CM

## 2023-04-15 DIAGNOSIS — I120 Hypertensive chronic kidney disease with stage 5 chronic kidney disease or end stage renal disease: Secondary | ICD-10-CM | POA: Diagnosis not present

## 2023-04-15 MED ORDER — EPOETIN ALFA-EPBX 40000 UNIT/ML IJ SOLN
40000.0000 [IU] | Freq: Once | INTRAMUSCULAR | Status: AC
  Administered 2023-04-15: 40000 [IU] via SUBCUTANEOUS
  Filled 2023-04-15: qty 1

## 2023-04-15 MED ORDER — DIPHENHYDRAMINE HCL 25 MG PO CAPS
50.0000 mg | ORAL_CAPSULE | ORAL | Status: DC | PRN
  Administered 2023-04-15: 50 mg via ORAL
  Filled 2023-04-15: qty 2

## 2023-04-19 ENCOUNTER — Encounter: Payer: Self-pay | Admitting: Oncology

## 2023-04-22 ENCOUNTER — Telehealth: Payer: Self-pay | Admitting: *Deleted

## 2023-04-22 NOTE — Telephone Encounter (Signed)
 I spoke to the patient that she will get and answer today .

## 2023-04-22 NOTE — Telephone Encounter (Signed)
 Pt has called triage regarding same concern, please advise

## 2023-04-25 ENCOUNTER — Encounter: Payer: Self-pay | Admitting: Oncology

## 2023-05-01 ENCOUNTER — Other Ambulatory Visit (INDEPENDENT_AMBULATORY_CARE_PROVIDER_SITE_OTHER): Payer: Self-pay | Admitting: Nurse Practitioner

## 2023-05-01 DIAGNOSIS — N184 Chronic kidney disease, stage 4 (severe): Secondary | ICD-10-CM

## 2023-05-02 ENCOUNTER — Ambulatory Visit (INDEPENDENT_AMBULATORY_CARE_PROVIDER_SITE_OTHER): Payer: Medicare Other

## 2023-05-02 ENCOUNTER — Ambulatory Visit (INDEPENDENT_AMBULATORY_CARE_PROVIDER_SITE_OTHER): Payer: Medicare Other | Admitting: Nurse Practitioner

## 2023-05-02 ENCOUNTER — Encounter (INDEPENDENT_AMBULATORY_CARE_PROVIDER_SITE_OTHER): Payer: Self-pay | Admitting: Nurse Practitioner

## 2023-05-02 VITALS — BP 173/90 | HR 69 | Resp 18 | Ht 60.0 in | Wt 131.8 lb

## 2023-05-02 DIAGNOSIS — I10 Essential (primary) hypertension: Secondary | ICD-10-CM

## 2023-05-02 DIAGNOSIS — N184 Chronic kidney disease, stage 4 (severe): Secondary | ICD-10-CM

## 2023-05-02 DIAGNOSIS — Z992 Dependence on renal dialysis: Secondary | ICD-10-CM | POA: Diagnosis not present

## 2023-05-02 DIAGNOSIS — N186 End stage renal disease: Secondary | ICD-10-CM

## 2023-05-06 ENCOUNTER — Telehealth (INDEPENDENT_AMBULATORY_CARE_PROVIDER_SITE_OTHER): Payer: Self-pay

## 2023-05-06 NOTE — Progress Notes (Signed)
 Subjective:    Patient ID: Jessica Patterson, female    DOB: 16-Jul-1960, 63 y.o.   MRN: [id number] Chief Complaint  Patient presents with   New Patient (Initial Visit)    NP. UE vmap/UE art/consult. permanent access placement. singh    The patient is seen for evaluation for dialysis access.  Currently the patient is maintained via [facility].  She has had some issues with dialysis but overall is tolerating it well.  The patient is followed by nephrology.    The patient is right-handed.  The patient has been considering the various methods of dialysis and wishes to proceed with hemodialysis and therefore creation of AV access is indicated.  No recent shortening of the patient's walking distance or new symptoms consistent with claudication.  No history of rest pain symptoms. No new ulcers or wounds of the lower extremities have occurred.  The patient denies amaurosis fugax or recent TIA symptoms. There are no recent neurological changes noted. There is no history of DVT, PE or superficial thrombophlebitis. No recent episodes of angina or shortness of breath documented.       Review of Systems     Objective:   Physical Exam  BP (!) 173/90   Pulse 69   Resp 18   Ht 5' (1.524 m)   Wt 131 lb 12.8 oz (59.8 kg)   BMI 25.74 kg/m   Past Medical History:  Diagnosis Date   CKD (chronic kidney disease), stage IV (HCC)    Hyperlipidemia    Hypertension    Kidney stone    Recurrent UTI    Seasonal allergies     Social History   Socioeconomic History   Marital status: Single    Spouse name: Not on file   Number of children: Not on file   Years of education: Not on file   Highest education level: Not on file  Occupational History   Not on file  Tobacco Use   Smoking status: Never   Smokeless tobacco: Never  Vaping Use   Vaping status: Never Used  Substance and Sexual Activity   Alcohol use: Never   Drug use: Not on file   Sexual activity: Not on file  Other Topics  Concern   Not on file  Social History Narrative   Not on file   Social Drivers of Health   Financial Resource Strain: Not on file  Food Insecurity: Low Risk  (02/11/2023)   Received from [facility]   Hunger Vital Sign    Worried About Running Out of Food in the Last Year: Never true    Ran Out of Food in the Last Year: Never true  Transportation Needs: No Transportation Needs (02/11/2023)   Received from [facility]    In the past 12 months, has lack of reliable transportation kept you from medical appointments, meetings, work or from getting things needed for daily living? : No  Physical Activity: Not on file  Stress: Not on file  Social Connections: Not on file  Intimate Partner Violence: Not on file    Past Surgical History:  Procedure Laterality Date   ABDOMINAL HERNIA REPAIR N/A    ABDOMINAL HYSTERECTOMY N/A    COLOSTOMY N/A    NEPHROSTOMY TUBE PLACEMENT ([facility] HX) N/A     Family History  Problem Relation Age of Onset   Lung cancer Mother    Stroke Father    Breast cancer Neg Hx     Allergies  Allergen  Reactions   Amlodipine Shortness Of Breath and Other (See Comments)    Other reaction(s): Shortness Of Breath Other reaction(s): Shortness Of Breath    Cephalosporins Nausea And Vomiting and Shortness Of Breath   Codeine Shortness Of Breath and Nausea And Vomiting   Hydrochlorothiazide Shortness Of Breath and Other (See Comments)    Other reaction(s): Shortness Of Breath Other reaction(s): Shortness Of Breath    Hydromorphone Hives, Nausea And Vomiting and Other (See Comments)    Other Reaction(s): GI Intolerance   Latex Itching and Rash    Other reaction(s): Itching  Other reaction(s): Itching    Other reaction(s): Itching Other reaction(s): Itching   Losartan Potassium Shortness Of Breath and Other (See Comments)    Other reaction(s): Shortness Of Breath Other reaction(s): Shortness Of Breath    Metoprolol Other (See Comments)    Doxycycline Hives and Rash    Other reaction(s): Hives Other reaction(s): Hives    Egg-Derived Products Nausea And Vomiting   Lisinopril     Other Reaction(s): cough (is bothersome, but cannot tolerate any other meds - see rest of reactions)   Sucralfate Rash   Cholecalciferol Other (See Comments)   Ciprofloxacin Other (See Comments)   Clonidine Hcl Nausea And Vomiting   Diltiazem    Doxazosin Mesylate Other (See Comments)   Hydralazine    Hydralazine Hcl Other (See Comments)   Iodine-131 Other (See Comments)    Can not receive due to Kidney disease Can not receive due to Kidney disease    Ivp Dye [Iodinated Contrast Media]    Nifedipine     Other Reaction(s): Other (See Comments)  CHEST PAIN ACHING   Ranitidine Hcl Other (See Comments)   Sulfa Antibiotics    Cefuroxime Axetil Nausea And Vomiting   Diltiazem Hcl Nausea Only    Other Reaction(s): GI Intolerance   Isosorbide Nausea And Vomiting and Other (See Comments)    Other Reaction(s): GI Intolerance   Morphine Hives, Nausea And Vomiting and Other (See Comments)    Other Reaction(s): GI Intolerance   Penicillin G Rash    welts welts    Prazosin Other (See Comments)    Headaches       Latest Ref Rng & Units 04/12/2023   10:37 AM 03/22/2023   12:04 PM 09/22/2019    6:31 AM  CBC  WBC 4.0 - 10.5 K/uL 8.7  7.1  6.3   Hemoglobin 12.0 - 15.0 g/dL 8.9  7.3  09.8   Hematocrit 36.0 - 46.0 % 27.4  23.1  33.1   Platelets 150 - 400 K/uL 157  181  228       CMP     Component Value Date/Time   NA 138 09/22/2019 0631   K 4.1 09/22/2019 0631   CL 113 (H) 09/22/2019 0631   CO2 17 (L) 09/22/2019 0631   GLUCOSE 112 (H) 09/22/2019 0631   BUN 49 (H) 09/22/2019 0631   CREATININE 2.14 (H) 09/22/2019 0631   CALCIUM 8.4 (L) 09/22/2019 0631   PROT 6.4 (L) 09/22/2019 0631   ALBUMIN 3.1 (L) 09/22/2019 0631   AST 13 (L) 09/22/2019 0631   ALT 10 09/22/2019 0631   ALKPHOS 80 09/22/2019 0631   BILITOT 0.5 09/22/2019 0631    GFRNONAA 25 (L) 09/22/2019 0631     No results found.     Assessment & Plan:   1. ESRD on dialysis [facility]) (Primary) Recommend:  At this time the patient does not have appropriate extremity access for dialysis  Patient should have a left brachial axillary av graft created.  The risks, benefits and alternative therapies were reviewed in detail with the patient.  All questions were answered.  The patient agrees to proceed with surgery.   The patient will follow up with me in the office after the surgery.  2. Primary hypertension Continue antihypertensive medications as already ordered, these medications have been reviewed and there are no changes at this time.   Current Outpatient Medications on File Prior to Visit  Medication Sig Dispense Refill   acetaminophen (TYLENOL) 325 MG tablet Take 650 mg by mouth every 6 (six) hours as needed.     amLODipine (NORVASC) 10 MG tablet Take 1 tablet by mouth daily.     busPIRone (BUSPAR) 5 MG tablet Take 5 mg by mouth 2 (two) times daily.     carvedilol (COREG) 12.5 MG tablet Take 12.5 mg by mouth 2 (two) times daily.     cetirizine (ZYRTEC) 10 MG tablet Take 10 mg by mouth daily.     cyclobenzaprine (FLEXERIL) 10 MG tablet Take 10 mg by mouth at bedtime as needed.     ferrous sulfate 325 (65 FE) MG tablet Take 1 tablet by mouth daily.     fluticasone (FLONASE) 50 MCG/ACT nasal spray Place into both nostrils daily as needed for allergies or rhinitis.     furosemide (LASIX) 40 MG tablet 40 mg. 40 mg on non hemodialysis days     hydrOXYzine (ATARAX) 25 MG tablet Take 25 mg by mouth every 6 (six) hours as needed.     montelukast (SINGULAIR) 10 MG tablet Take 10 mg by mouth as needed for Allergies     sodium bicarbonate 650 MG tablet Take 3 tablets by mouth once. Take 3 tablets (1,950 mg total) by mouth 3 times daily.     Vitamin D, Ergocalciferol, 50000 units CAPS VITAMIN D (ERGOCALCIFEROL) 50000 UNIT CAPS     Methoxy PEG-Epoetin Beta (MIRCERA  IJ) Inject as directed. (Patient not taking: Reported on 05/02/2023)     No current facility-administered medications on file prior to visit.    There are no Patient Instructions on file for this visit. No follow-ups on file.   Georgiana Spinner, NP

## 2023-05-06 NOTE — H&P (View-Only) (Signed)
 Subjective:    Patient ID: Jessica Patterson, female    DOB: 16-Jul-1960, 63 y.o.   MRN: [id number] Chief Complaint  Patient presents with   New Patient (Initial Visit)    NP. UE vmap/UE art/consult. permanent access placement. singh    The patient is seen for evaluation for dialysis access.  Currently the patient is maintained via PermCath.  She has had some issues with dialysis but overall is tolerating it well.  The patient is followed by nephrology.    The patient is right-handed.  The patient has been considering the various methods of dialysis and wishes to proceed with hemodialysis and therefore creation of AV access is indicated.  No recent shortening of the patient's walking distance or new symptoms consistent with claudication.  No history of rest pain symptoms. No new ulcers or wounds of the lower extremities have occurred.  The patient denies amaurosis fugax or recent TIA symptoms. There are no recent neurological changes noted. There is no history of DVT, PE or superficial thrombophlebitis. No recent episodes of angina or shortness of breath documented.       Review of Systems     Objective:   Physical Exam  BP (!) 173/90   Pulse 69   Resp 18   Ht 5' (1.524 m)   Wt 131 lb 12.8 oz (59.8 kg)   BMI 25.74 kg/m   Past Medical History:  Diagnosis Date   CKD (chronic kidney disease), stage IV (HCC)    Hyperlipidemia    Hypertension    Kidney stone    Recurrent UTI    Seasonal allergies     Social History   Socioeconomic History   Marital status: Single    Spouse name: Not on file   Number of children: Not on file   Years of education: Not on file   Highest education level: Not on file  Occupational History   Not on file  Tobacco Use   Smoking status: Never   Smokeless tobacco: Never  Vaping Use   Vaping status: Never Used  Substance and Sexual Activity   Alcohol use: Never   Drug use: Not on file   Sexual activity: Not on file  Other Topics  Concern   Not on file  Social History Narrative   Not on file   Social Drivers of Health   Financial Resource Strain: Not on file  Food Insecurity: Low Risk  (02/11/2023)   Received from [facility]   Hunger Vital Sign    Worried About Running Out of Food in the Last Year: Never true    Ran Out of Food in the Last Year: Never true  Transportation Needs: No Transportation Needs (02/11/2023)   Received from [facility]    In the past 12 months, has lack of reliable transportation kept you from medical appointments, meetings, work or from getting things needed for daily living? : No  Physical Activity: Not on file  Stress: Not on file  Social Connections: Not on file  Intimate Partner Violence: Not on file    Past Surgical History:  Procedure Laterality Date   ABDOMINAL HERNIA REPAIR N/A    ABDOMINAL HYSTERECTOMY N/A    COLOSTOMY N/A    NEPHROSTOMY TUBE PLACEMENT ([facility] HX) N/A     Family History  Problem Relation Age of Onset   Lung cancer Mother    Stroke Father    Breast cancer Neg Hx     Allergies  Allergen  Reactions   Amlodipine Shortness Of Breath and Other (See Comments)    Other reaction(s): Shortness Of Breath Other reaction(s): Shortness Of Breath    Cephalosporins Nausea And Vomiting and Shortness Of Breath   Codeine Shortness Of Breath and Nausea And Vomiting   Hydrochlorothiazide Shortness Of Breath and Other (See Comments)    Other reaction(s): Shortness Of Breath Other reaction(s): Shortness Of Breath    Hydromorphone Hives, Nausea And Vomiting and Other (See Comments)    Other Reaction(s): GI Intolerance   Latex Itching and Rash    Other reaction(s): Itching  Other reaction(s): Itching    Other reaction(s): Itching Other reaction(s): Itching   Losartan Potassium Shortness Of Breath and Other (See Comments)    Other reaction(s): Shortness Of Breath Other reaction(s): Shortness Of Breath    Metoprolol Other (See Comments)    Doxycycline Hives and Rash    Other reaction(s): Hives Other reaction(s): Hives    Egg-Derived Products Nausea And Vomiting   Lisinopril     Other Reaction(s): cough (is bothersome, but cannot tolerate any other meds - see rest of reactions)   Sucralfate Rash   Cholecalciferol Other (See Comments)   Ciprofloxacin Other (See Comments)   Clonidine Hcl Nausea And Vomiting   Diltiazem    Doxazosin Mesylate Other (See Comments)   Hydralazine    Hydralazine Hcl Other (See Comments)   Iodine-131 Other (See Comments)    Can not receive due to Kidney disease Can not receive due to Kidney disease    Ivp Dye [Iodinated Contrast Media]    Nifedipine     Other Reaction(s): Other (See Comments)  CHEST PAIN ACHING   Ranitidine Hcl Other (See Comments)   Sulfa Antibiotics    Cefuroxime Axetil Nausea And Vomiting   Diltiazem Hcl Nausea Only    Other Reaction(s): GI Intolerance   Isosorbide Nausea And Vomiting and Other (See Comments)    Other Reaction(s): GI Intolerance   Morphine Hives, Nausea And Vomiting and Other (See Comments)    Other Reaction(s): GI Intolerance   Penicillin G Rash    welts welts    Prazosin Other (See Comments)    Headaches       Latest Ref Rng & Units 04/12/2023   10:37 AM 03/22/2023   12:04 PM 09/22/2019    6:31 AM  CBC  WBC 4.0 - 10.5 K/uL 8.7  7.1  6.3   Hemoglobin 12.0 - 15.0 g/dL 8.9  7.3  09.8   Hematocrit 36.0 - 46.0 % 27.4  23.1  33.1   Platelets 150 - 400 K/uL 157  181  228       CMP     Component Value Date/Time   NA 138 09/22/2019 0631   K 4.1 09/22/2019 0631   CL 113 (H) 09/22/2019 0631   CO2 17 (L) 09/22/2019 0631   GLUCOSE 112 (H) 09/22/2019 0631   BUN 49 (H) 09/22/2019 0631   CREATININE 2.14 (H) 09/22/2019 0631   CALCIUM 8.4 (L) 09/22/2019 0631   PROT 6.4 (L) 09/22/2019 0631   ALBUMIN 3.1 (L) 09/22/2019 0631   AST 13 (L) 09/22/2019 0631   ALT 10 09/22/2019 0631   ALKPHOS 80 09/22/2019 0631   BILITOT 0.5 09/22/2019 0631    GFRNONAA 25 (L) 09/22/2019 0631     No results found.     Assessment & Plan:   1. ESRD on dialysis [facility]) (Primary) Recommend:  At this time the patient does not have appropriate extremity access for dialysis  Patient should have a left brachial axillary av graft created.  The risks, benefits and alternative therapies were reviewed in detail with the patient.  All questions were answered.  The patient agrees to proceed with surgery.   The patient will follow up with me in the office after the surgery.  2. Primary hypertension Continue antihypertensive medications as already ordered, these medications have been reviewed and there are no changes at this time.   Current Outpatient Medications on File Prior to Visit  Medication Sig Dispense Refill   acetaminophen (TYLENOL) 325 MG tablet Take 650 mg by mouth every 6 (six) hours as needed.     amLODipine (NORVASC) 10 MG tablet Take 1 tablet by mouth daily.     busPIRone (BUSPAR) 5 MG tablet Take 5 mg by mouth 2 (two) times daily.     carvedilol (COREG) 12.5 MG tablet Take 12.5 mg by mouth 2 (two) times daily.     cetirizine (ZYRTEC) 10 MG tablet Take 10 mg by mouth daily.     cyclobenzaprine (FLEXERIL) 10 MG tablet Take 10 mg by mouth at bedtime as needed.     ferrous sulfate 325 (65 FE) MG tablet Take 1 tablet by mouth daily.     fluticasone (FLONASE) 50 MCG/ACT nasal spray Place into both nostrils daily as needed for allergies or rhinitis.     furosemide (LASIX) 40 MG tablet 40 mg. 40 mg on non hemodialysis days     hydrOXYzine (ATARAX) 25 MG tablet Take 25 mg by mouth every 6 (six) hours as needed.     montelukast (SINGULAIR) 10 MG tablet Take 10 mg by mouth as needed for Allergies     sodium bicarbonate 650 MG tablet Take 3 tablets by mouth once. Take 3 tablets (1,950 mg total) by mouth 3 times daily.     Vitamin D, Ergocalciferol, 50000 units CAPS VITAMIN D (ERGOCALCIFEROL) 50000 UNIT CAPS     Methoxy PEG-Epoetin Beta (MIRCERA  IJ) Inject as directed. (Patient not taking: Reported on 05/02/2023)     No current facility-administered medications on file prior to visit.    There are no Patient Instructions on file for this visit. No follow-ups on file.   Georgiana Spinner, NP

## 2023-05-06 NOTE — Telephone Encounter (Signed)
 I attempted to contact the patient to schedule her for a left brachial axillary graft with Dr. Gilda Crease. A message was left for a return call.

## 2023-05-10 ENCOUNTER — Ambulatory Visit

## 2023-05-10 ENCOUNTER — Other Ambulatory Visit

## 2023-05-13 ENCOUNTER — Inpatient Hospital Stay

## 2023-05-13 ENCOUNTER — Inpatient Hospital Stay: Attending: Oncology

## 2023-05-13 VITALS — BP 177/91 | HR 73 | Temp 97.0°F | Resp 18

## 2023-05-13 DIAGNOSIS — Z801 Family history of malignant neoplasm of trachea, bronchus and lung: Secondary | ICD-10-CM | POA: Insufficient documentation

## 2023-05-13 DIAGNOSIS — N186 End stage renal disease: Secondary | ICD-10-CM | POA: Insufficient documentation

## 2023-05-13 DIAGNOSIS — Z992 Dependence on renal dialysis: Secondary | ICD-10-CM | POA: Insufficient documentation

## 2023-05-13 DIAGNOSIS — R5383 Other fatigue: Secondary | ICD-10-CM | POA: Diagnosis not present

## 2023-05-13 DIAGNOSIS — F419 Anxiety disorder, unspecified: Secondary | ICD-10-CM | POA: Insufficient documentation

## 2023-05-13 DIAGNOSIS — D631 Anemia in chronic kidney disease: Secondary | ICD-10-CM

## 2023-05-13 DIAGNOSIS — Z79899 Other long term (current) drug therapy: Secondary | ICD-10-CM | POA: Diagnosis not present

## 2023-05-13 DIAGNOSIS — I120 Hypertensive chronic kidney disease with stage 5 chronic kidney disease or end stage renal disease: Secondary | ICD-10-CM | POA: Insufficient documentation

## 2023-05-13 LAB — HEMOGLOBIN AND HEMATOCRIT, BLOOD
HCT: 25.7 % — ABNORMAL LOW (ref 36.0–46.0)
Hemoglobin: 8.2 g/dL — ABNORMAL LOW (ref 12.0–15.0)

## 2023-05-13 MED ORDER — EPOETIN ALFA-EPBX 40000 UNIT/ML IJ SOLN
40000.0000 [IU] | Freq: Once | INTRAMUSCULAR | Status: AC
  Administered 2023-05-13: 40000 [IU] via SUBCUTANEOUS
  Filled 2023-05-13: qty 1

## 2023-05-13 MED ORDER — DIPHENHYDRAMINE HCL 25 MG PO CAPS
50.0000 mg | ORAL_CAPSULE | ORAL | Status: DC | PRN
  Administered 2023-05-13: 50 mg via ORAL
  Filled 2023-05-13 (×2): qty 2

## 2023-05-13 NOTE — Patient Instructions (Signed)

## 2023-05-24 ENCOUNTER — Other Ambulatory Visit: Payer: Self-pay

## 2023-05-24 ENCOUNTER — Other Ambulatory Visit (INDEPENDENT_AMBULATORY_CARE_PROVIDER_SITE_OTHER): Payer: Self-pay | Admitting: Nurse Practitioner

## 2023-05-24 ENCOUNTER — Encounter
Admission: RE | Admit: 2023-05-24 | Discharge: 2023-05-24 | Disposition: A | Discharge status: Home / Self Care | Source: Ambulatory Visit | Attending: Vascular Surgery | Admitting: Vascular Surgery

## 2023-05-24 DIAGNOSIS — Z01818 Encounter for other preprocedural examination: Secondary | ICD-10-CM | POA: Diagnosis present

## 2023-05-24 DIAGNOSIS — Z992 Dependence on renal dialysis: Secondary | ICD-10-CM | POA: Diagnosis not present

## 2023-05-24 DIAGNOSIS — Z01810 Encounter for preprocedural cardiovascular examination: Secondary | ICD-10-CM | POA: Diagnosis not present

## 2023-05-24 DIAGNOSIS — N186 End stage renal disease: Secondary | ICD-10-CM | POA: Insufficient documentation

## 2023-05-24 HISTORY — DX: Other specified postprocedural states: Z98.890

## 2023-05-24 HISTORY — DX: Anemia, unspecified: D64.9

## 2023-05-24 HISTORY — DX: Dysplasia of cervix uteri, unspecified: N87.9

## 2023-05-24 HISTORY — DX: Nausea with vomiting, unspecified: R11.2

## 2023-05-24 HISTORY — DX: Depression, unspecified: F32.A

## 2023-05-24 HISTORY — DX: Exstrophy of urinary bladder, unspecified: Q64.10

## 2023-05-24 HISTORY — DX: Sepsis, unspecified organism: A41.9

## 2023-05-24 HISTORY — DX: Pneumonia due to coronavirus disease 2019: J12.82

## 2023-05-24 HISTORY — DX: COVID-19: U07.1

## 2023-05-24 HISTORY — DX: Anxiety disorder, unspecified: F41.9

## 2023-05-24 HISTORY — DX: Gastro-esophageal reflux disease without esophagitis: K21.9

## 2023-05-24 HISTORY — DX: Personal history of urinary calculi: Z87.442

## 2023-05-24 LAB — CBC WITH DIFFERENTIAL/PLATELET
Abs Immature Granulocytes: 0.02 10*3/uL (ref 0.00–0.07)
Basophils Absolute: 0.1 10*3/uL (ref 0.0–0.1)
Basophils Relative: 1 %
Eosinophils Absolute: 0.7 10*3/uL — ABNORMAL HIGH (ref 0.0–0.5)
Eosinophils Relative: 9 %
HCT: 26.2 % — ABNORMAL LOW (ref 36.0–46.0)
Hemoglobin: 8.7 g/dL — ABNORMAL LOW (ref 12.0–15.0)
Immature Granulocytes: 0 %
Lymphocytes Relative: 15 %
Lymphs Abs: 1.2 10*3/uL (ref 0.7–4.0)
MCH: 31.1 pg (ref 26.0–34.0)
MCHC: 33.2 g/dL (ref 30.0–36.0)
MCV: 93.6 fL (ref 80.0–100.0)
Monocytes Absolute: 0.4 10*3/uL (ref 0.1–1.0)
Monocytes Relative: 5 %
Neutro Abs: 5.4 10*3/uL (ref 1.7–7.7)
Neutrophils Relative %: 70 %
Platelets: 183 10*3/uL (ref 150–400)
RBC: 2.8 MIL/uL — ABNORMAL LOW (ref 3.87–5.11)
RDW: 17.2 % — ABNORMAL HIGH (ref 11.5–15.5)
WBC: 7.8 10*3/uL (ref 4.0–10.5)
nRBC: 0 % (ref 0.0–0.2)

## 2023-05-24 LAB — BASIC METABOLIC PANEL WITH GFR
Anion gap: 11 (ref 5–15)
BUN: 75 mg/dL — ABNORMAL HIGH (ref 8–23)
CO2: 19 mmol/L — ABNORMAL LOW (ref 22–32)
Calcium: 7.9 mg/dL — ABNORMAL LOW (ref 8.9–10.3)
Chloride: 106 mmol/L (ref 98–111)
Creatinine, Ser: 7.91 mg/dL — ABNORMAL HIGH (ref 0.44–1.00)
GFR, Estimated: 5 mL/min — ABNORMAL LOW (ref >60)
Glucose, Bld: 83 mg/dL (ref 70–99)
Potassium: 5.6 mmol/L — ABNORMAL HIGH (ref 3.5–5.1)
Sodium: 136 mmol/L (ref 135–145)

## 2023-05-24 LAB — TYPE AND SCREEN
ABO/RH(D): A NEG
Antibody Screen: NEGATIVE
Extend sample reason: TRANSFUSED

## 2023-05-24 NOTE — Progress Notes (Signed)
 Perioperative Services Pre-Admission/Anesthesia Testing   Date: 05/24/23 Name: Jessica Patterson MRN:   [id number]  Re: Consideration of preoperative prophylactic antibiotic change   Request sent to: Schnier, Ninette Basque, MD (routed and/or faxed via [facility])  Planned Surgical Procedure(s):    Case: [id number] Date/Time: 05/31/23 0715   Procedure: INSERTION, GRAFT, ARTERIOVENOUS, UPPER EXTREMITY (Left) - BRACHIAL AXILLARY   Anesthesia type: General   Diagnosis: ESRD (end stage renal disease) (HCC) [N18.6]   Pre-op  diagnosis: ESRD   Location: [facility] OR ROOM 08 / [facility] ORS FOR ANESTHESIA GROUP   Surgeons: Jackquelyn Mass, MD      Clinical Notes:  Patient has a documented allergy/intolerance to PCN  Advising that PCN and cephalosporins have caused her to experience N/V, rash, and SOB in the past.   EMR review indicated that patient received PCN and/or cephalosporin in the past as follows: CEFOXITIN received on 12/23/2015, 07/12/2017, and 09/18/2017 with no documented ADRs. Allergy section in EMR updated to reflect tolerance.   CEFPODOXIME received on 03/02/2022 with no documented ADRs. Allergy section in EMR updated to reflect tolerance.    Screened as appropriate for cephalosporin use during medication reconciliation No immediate angioedema, dysphagia, SOB, anaphylaxis symptoms. No severe rash involving mucous membranes or skin necrosis. No hospital admissions related to side effects of PCN/cephalosporin use.   Request:  As an evidence based approach to reducing the rate of incidence for post-operative SSI and the development of MDROs, could an agent that allows for narrower antimicrobial coverage for preoperative prophylaxis in this patient's upcoming surgical course be considered?   Currently ordered preoperative prophylactic ABX: vancomycin .   Specifically requesting change to cephalosporin (CEFAZOLIN).   Drug of choice for many procedures; it is the most widely studied antimicrobial  agent with proven efficacy for antimicrobial prophylaxis.   Desirable duration of action, spectrum of activity against organisms commonly encountered in surgery, and it has an excellent safety profile and low cost.   Active against streptococci, methicillin-susceptible staphylococci, and many gram-negative organisms.  Despite being a first-generation cephalosporin, cefazolin is structurally different than other cephalosporins. Cefazolin has two side chains (R1 and R2) that have structures that do not match those of any other FDA-approved ?-lactams or PCN.  True allergies to cefazolin are extremely rare (<1%) and are usually specific for its side chains, not the shared core ring.  Please communicate decision with me and I will change the orders in Epic as per your direction.    Things to consider: Many patients report that they were "allergic" to PCN earlier in life, however this does not translate into a true lifelong allergy. Patients can lose sensitivity to specific IgE antibodies over time if PCN is avoided (Kleris & Lugar, 2019).  Penicillin allergies are reported by 8% to 15% of the [place]  population, but up to 95% of these allergies do not correspond to a true allergy when tested Rozetta Corns et al., 2022).   Cross-sensitivity between PCN and cephalosporins has been documented as being as high as 10%, however this estimation included data believed to have been collected in a setting where there was contamination. Newer data suggests that the prevalence of cross-sensitivity between PCN and cephalosporins is actually estimated to be closer to 1% (Hermanides et al., 2018).   Patients labeled as PCN allergic, whether they are truly allergic or not, have been found to have inferior outcomes in terms of rates of serious infection, and these patients tend to have longer hospital stays [facility] & Lugar, 2019).  Treatment related  secondary infections, such as Clostridioides difficile, have been linked to the  improper use of broad spectrum antibiotics in patients improperly labeled as PCN allergic (Kleris & Lugar, 2019).  Anaphylaxis from cephalosporins is rare and the evidence suggests that there is no increased risk of an anaphylactic type reaction when cephalosporins are used in a PCN allergic patient (Pichichero, 2006).  Citations: Hermanides J, Lemkes BA, Prins Rudine Cos MW, Terreehorst I. Presumed ?-Lactam Allergy and Cross-reactivity in the Operating Theater: A Practical Approach. Anesthesiology. 2018 Aug;129(2):335-342. doi: 10.1097/ALN.0000000000002252. PMID: 16109604.  Kleris, R. S., & Lugar, P. L. (2019). Things We Do For No Reason: Failing to Question a Penicillin Allergy History. Journal of hospital medicine, 14(10), 303-286-4475. Advance online publication. airportbarriers.com  Pichichero, M. E. (2006). Cephalosporins can be prescribed safely for penicillin-allergic patients. Journal of family medicine, 55(2), 106-112. Accessed: https://cdn.mdedge.com/files/s68fs-public/Document/September-2017/5502JFP_AppliedEvidence1.pdf  Levada Raymond., & Wayna Hails, D. R. (2022). Understanding Penicillin Allergy, Cross-reactivity, and Antibiotic Selection in the Preoperative Setting. The Journal of the American Academy of Orthopaedic Surgeons, 30(1), e1-e5. CallRank.tn   Renate Caroline, MSN, APRN, FNP-C, CEN [facility]  Perioperative Services Nurse Practitioner Phone: [phone] Fax: [phone] 05/24/23 2:42 PM  NOTE: This note has been prepared using Dragon dictation software. Despite my best ability to proofread, there is always the potential that unintentional transcriptional errors may still occur from this process.

## 2023-05-24 NOTE — Patient Instructions (Addendum)
 Your procedure is scheduled on: Friday 05/31/23 To find out your arrival time, please call [phone] between 1PM - 3PM on:  Thursday 05/30/23  Report to the Registration Desk on the 1st floor of the [facility]. FREE Valet parking is available.  If your arrival time is 6:00 am, do not arrive before that time as the Medical Mall entrance doors do not open until 6:00 am.  REMEMBER: Instructions that are not followed completely may result in serious medical risk, up to and including death; or upon the discretion of your surgeon and anesthesiologist your surgery may need to be rescheduled.  Do not eat food or drink any liquids after midnight the night before surgery.  No gum chewing or hard candies.  One week prior to surgery: Stop Anti-inflammatories (NSAIDS) such as Advil, Aleve, Ibuprofen, Motrin, Naproxen, Naprosyn and Aspirin based products such as Excedrin, Goody's Powder, BC Powder. You may however, continue to take Tylenol  if needed for pain up until the day of surgery.  Stop ANY OVER THE COUNTER supplements until after surgery.  Continue taking all prescribed medications as usual with the exception of the following: Bring your Amlodipine and your Carvedilol in original prescription bottles the day of surgery since you have concerns about blood pressure getting to low if taken before your arrival.  TAKE ONLY THESE MEDICATIONS THE MORNING OF SURGERY WITH A SIP OF WATER:  none  No Alcohol for 24 hours before or after surgery.  No Smoking including e-cigarettes for 24 hours before surgery.  No chewable tobacco products for at least 6 hours before surgery.  No nicotine patches on the day of surgery.  Do not use any "recreational" drugs for at least a week (preferably 2 weeks) before your surgery.  Please be advised that the combination of cocaine and anesthesia may have negative outcomes, up to and including death. If you test positive for cocaine, your surgery will be  cancelled.  On the morning of surgery brush your teeth with toothpaste and water, you may rinse your mouth with mouthwash if you wish. Do not swallow any toothpaste or mouthwash.  Use CHG Soap or wipes as directed on instruction sheet.  Do not wear lotions, powders, or perfumes. No Deodorant the day of surgery  Do not shave body hair from the neck down 48 hours before surgery.  Wear comfortable clothing (specific to your surgery type) to the hospital.  Do not wear jewelry, make-up, hairpins, clips or nail polish.  For welded (permanent) jewelry: bracelets, anklets, waist bands, etc.  Please have this removed prior to surgery.  If it is not removed, there is a chance that hospital personnel will need to cut it off on the day of surgery. Contact lenses, hearing aids and dentures may not be worn into surgery.  Do not bring valuables to the hospital. [facility] is not responsible for any missing/lost belongings or valuables.   Notify your doctor if there is any change in your medical condition (cold, fever, infection).  If you are being discharged the day of surgery, you will not be allowed to drive home. You will need a responsible individual to drive you home and stay with you for 24 hours after surgery.   If you are taking public transportation, you will need to have a responsible individual with you.  If you are being admitted to the hospital overnight, leave your suitcase in the car. After surgery it may be brought to your room.  After surgery, you can help  prevent lung complications by doing breathing exercises.  Take deep breaths and cough every 1-2 hours. Your doctor may order a device called an Incentive Spirometer to help you take deep breaths.  Surgery Visitation Policy:  Patients undergoing a surgery or procedure may have two family members or support persons with them as long as the person is not COVID-19 positive or experiencing its symptoms.   Please call the  Pre-admissions Testing Dept. at [phone] if you have any questions about these instructions.  Preparing the Skin Before Surgery     To help prevent the risk of infection at your surgical site, we are now providing you with rinse-free Sage 2% Chlorhexidine  Gluconate (CHG) disposable wipes.  Chlorhexidine  Gluconate (CHG) Soap  o An antiseptic cleaner that kills germs and bonds with the skin to continue killing germs even after washing  o Used for showering the night before surgery and morning of surgery  The night before surgery: Shower or bathe with warm water. Do not apply perfume, lotions, powders. Wait one hour after shower. Skin should be dry and cool. Open Sage wipe package - use 6 disposable cloths. Wipe body using one cloth for the right arm, one cloth for the left arm, one cloth for the right leg, one cloth for the left leg, one cloth for the chest/abdomen area, and one cloth for the back. Do not use on open wounds or sores. Do not use on face or genitals (private parts). If you are breast feeding, do not use on breasts. 5. Do not rinse, allow to dry. 6. Skin may feel "tacky" for several minutes. 7. Dress in clean clothes. 8. Place clean sheets on your bed and do not sleep with pets.  REPEAT ABOVE ON THE MORNING OF SURGERY BEFORE ARRIVING TO THE HOSPITAL.

## 2023-05-24 NOTE — Progress Notes (Signed)
  [facility] Perioperative Services: Pre-Admission/Anesthesia Testing  Abnormal Lab Notification   Date: 05/24/23  Name: Jessica Patterson MRN:   [id number]  Re: Abnormal labs noted during PAT appointment   Notified:    Provider Name Provider Role Notification Mode  Schnier, Cordella MATSU, MD Vascular Surgery (Surgeon) Routed and/or faxed via [facility] Delores Pickles, NP-C Vascular Surgery (APP) Routed and/or faxed via [facility]   ABNORMAL LAB VALUE(S):   Lab Results  Component Value Date   K 5.6 (H) 05/24/2023   Clinical Information and Notes:  Jessica Patterson is scheduled for a INSERTION, GRAFT, ARTERIOVENOUS, UPPER EXTREMITY (Left) on 05/31/2023.  Preoperative K+ elevated to 5.6 mmol/L. Patient is on hemodialysis on Tuesdays and Saturdays.  She denies having missed any of her scheduled treatments.  ECG performed today revealed normal sinus rhythm at a rate of 67 bpm.  There were no significant ST/T wave changes.    Will plan on rechecking K+ level on the day of surgery to ensure optimization prior to proceeding with planned surgical intervention.  Dorise Pereyra, MSN, APRN, FNP-C, CEN [facility]  Perioperative Services Nurse Practitioner Phone: [phone] Fax: [phone] 05/24/23 2:28 PM

## 2023-05-24 NOTE — Pre-Procedure Instructions (Signed)
 Patient has concerns for too low blood pressure if she takes her Amlodipine and/or Carvedilol prior to arrival DOS. She was instructed to bring in original RX bottles. Decision to be left to Anesthesia on DOS.

## 2023-05-31 ENCOUNTER — Ambulatory Visit
Admission: RE | Admit: 2023-05-31 | Discharge: 2023-05-31 | Disposition: A | Discharge status: Home / Self Care | Source: Ambulatory Visit | Attending: Vascular Surgery | Admitting: Vascular Surgery

## 2023-05-31 ENCOUNTER — Ambulatory Visit: Admitting: Urgent Care

## 2023-05-31 ENCOUNTER — Other Ambulatory Visit: Payer: Self-pay

## 2023-05-31 ENCOUNTER — Encounter
Admission: RE | Disposition: A | Discharge status: Home / Self Care | Payer: Self-pay | Source: Ambulatory Visit | Attending: Vascular Surgery

## 2023-05-31 ENCOUNTER — Encounter: Payer: Self-pay | Admitting: Vascular Surgery

## 2023-05-31 DIAGNOSIS — Z992 Dependence on renal dialysis: Secondary | ICD-10-CM | POA: Diagnosis not present

## 2023-05-31 DIAGNOSIS — F419 Anxiety disorder, unspecified: Secondary | ICD-10-CM | POA: Insufficient documentation

## 2023-05-31 DIAGNOSIS — K219 Gastro-esophageal reflux disease without esophagitis: Secondary | ICD-10-CM | POA: Insufficient documentation

## 2023-05-31 DIAGNOSIS — Z01812 Encounter for preprocedural laboratory examination: Secondary | ICD-10-CM

## 2023-05-31 DIAGNOSIS — N186 End stage renal disease: Secondary | ICD-10-CM | POA: Insufficient documentation

## 2023-05-31 DIAGNOSIS — I120 Hypertensive chronic kidney disease with stage 5 chronic kidney disease or end stage renal disease: Secondary | ICD-10-CM | POA: Diagnosis not present

## 2023-05-31 HISTORY — PX: INSERTION OF ARTERIOVENOUS (AV) ARTEGRAFT ARM: SHX6779

## 2023-05-31 LAB — POCT I-STAT, CHEM 8
BUN: 62 mg/dL — ABNORMAL HIGH (ref 8–23)
Calcium, Ion: 1.09 mmol/L — ABNORMAL LOW (ref 1.15–1.40)
Chloride: 111 mmol/L (ref 98–111)
Creatinine, Ser: 8.8 mg/dL — ABNORMAL HIGH (ref 0.44–1.00)
Glucose, Bld: 93 mg/dL (ref 70–99)
HCT: 26 % — ABNORMAL LOW (ref 36.0–46.0)
Hemoglobin: 8.8 g/dL — ABNORMAL LOW (ref 12.0–15.0)
Potassium: 4.9 mmol/L (ref 3.5–5.1)
Sodium: 137 mmol/L (ref 135–145)
TCO2: 17 mmol/L — ABNORMAL LOW (ref 22–32)

## 2023-05-31 LAB — TYPE AND SCREEN
ABO/RH(D): A NEG
Antibody Screen: NEGATIVE

## 2023-05-31 MED ORDER — VANCOMYCIN HCL IN DEXTROSE 1-5 GM/200ML-% IV SOLN
1000.0000 mg | INTRAVENOUS | Status: AC
Start: 2023-05-31 — End: 2023-05-31
  Administered 2023-05-31 (×2): 1000 mg via INTRAVENOUS

## 2023-05-31 MED ORDER — ACETAMINOPHEN 10 MG/ML IV SOLN
INTRAVENOUS | Status: AC
  Filled 2023-05-31: qty 100

## 2023-05-31 MED ORDER — ACETAMINOPHEN 10 MG/ML IV SOLN
INTRAVENOUS | Status: DC | PRN
  Administered 2023-05-31: 1000 mg via INTRAVENOUS

## 2023-05-31 MED ORDER — EPHEDRINE 5 MG/ML INJ
INTRAVENOUS | Status: AC
  Filled 2023-05-31: qty 5

## 2023-05-31 MED ORDER — SODIUM CHLORIDE 0.9 % IV SOLN
INTRAVENOUS | Status: DC | PRN
  Administered 2023-05-31: 501 mL

## 2023-05-31 MED ORDER — LIDOCAINE HCL (PF) 2 % IJ SOLN
INTRAMUSCULAR | Status: AC
  Filled 2023-05-31: qty 5

## 2023-05-31 MED ORDER — ROCURONIUM BROMIDE 10 MG/ML (PF) SYRINGE
PREFILLED_SYRINGE | INTRAVENOUS | Status: AC
  Filled 2023-05-31: qty 10

## 2023-05-31 MED ORDER — BUPIVACAINE LIPOSOME 1.3 % IJ SUSP
INTRAMUSCULAR | Status: DC | PRN
  Administered 2023-05-31: 50 mL

## 2023-05-31 MED ORDER — PHENYLEPHRINE 80 MCG/ML (10ML) SYRINGE FOR IV PUSH (FOR BLOOD PRESSURE SUPPORT)
PREFILLED_SYRINGE | INTRAVENOUS | Status: AC
  Filled 2023-05-31: qty 10

## 2023-05-31 MED ORDER — ORAL CARE MOUTH RINSE: 15.0000 mL | Freq: Once | OROMUCOSAL | Status: AC

## 2023-05-31 MED ORDER — KETAMINE HCL 50 MG/5ML IJ SOSY
PREFILLED_SYRINGE | INTRAMUSCULAR | Status: AC
  Filled 2023-05-31: qty 5

## 2023-05-31 MED ORDER — ONDANSETRON HCL 4 MG/2ML IJ SOLN
INTRAMUSCULAR | Status: AC
Start: 2023-05-31 — End: ?
  Filled 2023-05-31: qty 2

## 2023-05-31 MED ORDER — ONDANSETRON HCL 4 MG/2ML IJ SOLN
4.0000 mg | Freq: Four times a day (QID) | INTRAMUSCULAR | Status: DC | PRN
Start: 2023-05-31 — End: 2023-05-31

## 2023-05-31 MED ORDER — CHLORHEXIDINE GLUCONATE 0.12 % MT SOLN
OROMUCOSAL | Status: AC
  Filled 2023-05-31: qty 15

## 2023-05-31 MED ORDER — PROPOFOL 1000 MG/100ML IV EMUL
INTRAVENOUS | Status: AC
  Filled 2023-05-31: qty 100

## 2023-05-31 MED ORDER — KETAMINE HCL 50 MG/5ML IJ SOSY
PREFILLED_SYRINGE | INTRAMUSCULAR | Status: DC | PRN
  Administered 2023-05-31: 20 mg via INTRAVENOUS

## 2023-05-31 MED ORDER — SODIUM CHLORIDE 0.9 % IV SOLN: INTRAVENOUS | Status: DC

## 2023-05-31 MED ORDER — PHENYLEPHRINE 80 MCG/ML (10ML) SYRINGE FOR IV PUSH (FOR BLOOD PRESSURE SUPPORT)
PREFILLED_SYRINGE | INTRAVENOUS | Status: DC | PRN
Start: 2023-05-31 — End: 2023-05-31
  Administered 2023-05-31 (×3): 80 ug via INTRAVENOUS
  Administered 2023-05-31: 40 ug via INTRAVENOUS
  Administered 2023-05-31 (×2): 80 ug via INTRAVENOUS

## 2023-05-31 MED ORDER — BUPIVACAINE HCL (PF) 0.5 % IJ SOLN
INTRAMUSCULAR | Status: AC
  Filled 2023-05-31: qty 30

## 2023-05-31 MED ORDER — OXYCODONE HCL 5 MG PO TABS: 5.0000 mg | ORAL_TABLET | Freq: Once | ORAL | Status: DC | PRN

## 2023-05-31 MED ORDER — PROPOFOL 500 MG/50ML IV EMUL
INTRAVENOUS | Status: DC | PRN
  Administered 2023-05-31: 25 ug/kg/min via INTRAVENOUS

## 2023-05-31 MED ORDER — DIPHENHYDRAMINE HCL 50 MG/ML IJ SOLN
INTRAMUSCULAR | Status: DC | PRN
  Administered 2023-05-31: 25 mg via INTRAVENOUS

## 2023-05-31 MED ORDER — OXYCODONE-ACETAMINOPHEN 5-325 MG PO TABS: 1.0000 | ORAL_TABLET | Freq: Four times a day (QID) | ORAL | 0 refills | Status: AC | PRN

## 2023-05-31 MED ORDER — FENTANYL CITRATE (PF) 100 MCG/2ML IJ SOLN
25.0000 ug | INTRAMUSCULAR | Status: DC | PRN
  Administered 2023-05-31 (×2): 25 ug via INTRAVENOUS

## 2023-05-31 MED ORDER — LIDOCAINE HCL (CARDIAC) PF 100 MG/5ML IV SOSY
PREFILLED_SYRINGE | INTRAVENOUS | Status: DC | PRN
  Administered 2023-05-31: 100 mg via INTRAVENOUS

## 2023-05-31 MED ORDER — CHLORHEXIDINE GLUCONATE CLOTH 2 % EX PADS
6.0000 | MEDICATED_PAD | Freq: Once | CUTANEOUS | Status: DC
Start: 2023-05-31 — End: 2023-05-31

## 2023-05-31 MED ORDER — PROPOFOL 10 MG/ML IV BOLUS
INTRAVENOUS | Status: AC
  Filled 2023-05-31: qty 20

## 2023-05-31 MED ORDER — MIDAZOLAM HCL 2 MG/2ML IJ SOLN
INTRAMUSCULAR | Status: AC
  Filled 2023-05-31: qty 2

## 2023-05-31 MED ORDER — ONDANSETRON HCL 4 MG/2ML IJ SOLN
INTRAMUSCULAR | Status: DC | PRN
  Administered 2023-05-31: 4 mg via INTRAVENOUS

## 2023-05-31 MED ORDER — EPHEDRINE SULFATE-NACL 50-0.9 MG/10ML-% IV SOSY
PREFILLED_SYRINGE | INTRAVENOUS | Status: DC | PRN
  Administered 2023-05-31 (×10): 5 mg via INTRAVENOUS

## 2023-05-31 MED ORDER — HEPARIN SODIUM (PORCINE) 5000 UNIT/ML IJ SOLN
INTRAMUSCULAR | Status: AC
  Filled 2023-05-31: qty 1

## 2023-05-31 MED ORDER — PROPOFOL 10 MG/ML IV BOLUS
INTRAVENOUS | Status: DC | PRN
  Administered 2023-05-31: 100 mg via INTRAVENOUS
  Administered 2023-05-31: 50 mg via INTRAVENOUS

## 2023-05-31 MED ORDER — FENTANYL CITRATE (PF) 100 MCG/2ML IJ SOLN
INTRAMUSCULAR | Status: AC
  Filled 2023-05-31: qty 2

## 2023-05-31 MED ORDER — DIPHENHYDRAMINE HCL 50 MG/ML IJ SOLN
INTRAMUSCULAR | Status: AC
  Filled 2023-05-31: qty 1

## 2023-05-31 MED ORDER — CHLORHEXIDINE GLUCONATE 0.12 % MT SOLN
15.0000 mL | Freq: Once | OROMUCOSAL | Status: AC
  Administered 2023-05-31: 15 mL via OROMUCOSAL

## 2023-05-31 MED ORDER — BUPIVACAINE LIPOSOME 1.3 % IJ SUSP
INTRAMUSCULAR | Status: AC
  Filled 2023-05-31: qty 20

## 2023-05-31 MED ORDER — MIDAZOLAM HCL 2 MG/2ML IJ SOLN
INTRAMUSCULAR | Status: DC | PRN
  Administered 2023-05-31: 2 mg via INTRAVENOUS

## 2023-05-31 MED ORDER — VANCOMYCIN HCL IN DEXTROSE 1-5 GM/200ML-% IV SOLN
INTRAVENOUS | Status: AC
  Filled 2023-05-31: qty 200

## 2023-05-31 MED ORDER — CHLORHEXIDINE GLUCONATE CLOTH 2 % EX PADS
6.0000 | MEDICATED_PAD | Freq: Once | CUTANEOUS | Status: AC
  Administered 2023-05-31: 6 via TOPICAL

## 2023-05-31 MED ORDER — OXYCODONE HCL 5 MG/5ML PO SOLN: 5.0000 mg | Freq: Once | ORAL | Status: DC | PRN

## 2023-05-31 SURGICAL SUPPLY — 3 items
GLOVE SURG SYN 8.0 (GLOVE) ×1 IMPLANT
NDL FILTER BLUNT 18X1 1/2 (NEEDLE) ×1 IMPLANT
STOCKINETTE STRL 4IN 9604848 (GAUZE/BANDAGES/DRESSINGS) ×1 IMPLANT

## 2023-05-31 NOTE — Transfer of Care (Signed)
 Immediate Anesthesia Transfer of Care Note  Patient: Jessica Patterson  Procedure(s) Performed: INSERTION, GRAFT, ARTERIOVENOUS, UPPER EXTREMITY (Left)  Patient Location: PACU  Anesthesia Type:General  Level of Consciousness: awake, drowsy, and patient cooperative  Airway & Oxygen Therapy: Patient Spontanous Breathing and Patient connected to face mask oxygen  Post-op Assessment: Report given to RN and Post -op Vital signs reviewed and unstable, Anesthesiologist notified  Post vital signs: Reviewed and stable  Last Vitals:  Vitals Value Taken Time  BP 128/69 05/31/23 1012  Temp 36.4 C 05/31/23 1012  Pulse 72 05/31/23 1014  Resp 16 05/31/23 1014  SpO2 100 % 05/31/23 1014  Vitals shown include unfiled device data.  Last Pain:  Vitals:   05/31/23 0631  TempSrc: Temporal  PainSc: 5       Patients Stated Pain Goal: 2 (05/31/23 0631)  Complications: No notable events documented.

## 2023-05-31 NOTE — Anesthesia Postprocedure Evaluation (Signed)
 Anesthesia Post Note  Patient: Jessica Patterson  Procedure(s) Performed: INSERTION, GRAFT, ARTERIOVENOUS, UPPER EXTREMITY (Left)  Patient location during evaluation: PACU Anesthesia Type: General Level of consciousness: awake and alert Pain management: pain level controlled Vital Signs Assessment: post-procedure vital signs reviewed and stable Respiratory status: spontaneous breathing, nonlabored ventilation, respiratory function stable and patient connected to nasal cannula oxygen Cardiovascular status: blood pressure returned to baseline and stable Postop Assessment: no apparent nausea or vomiting Anesthetic complications: no  No notable events documented.   Last Vitals:  Vitals:   05/31/23 1110 05/31/23 1131  BP:  (!) 158/68  Pulse: 65 68  Resp: 14 18  Temp:  (!) 36.2 C  SpO2: 96% 97%    Last Pain:  Vitals:   05/31/23 1131  TempSrc: Temporal  PainSc: 5                  Enrique Harvest

## 2023-05-31 NOTE — Anesthesia Preprocedure Evaluation (Signed)
 Anesthesia Evaluation  Patient identified by MRN, date of birth, ID band Patient awake    Reviewed: Allergy & Precautions, NPO status , Patient's Chart, lab work & pertinent test results  History of Anesthesia Complications (+) PONV and history of anesthetic complications  Airway Mallampati: III  TM Distance: >3 FB Neck ROM: full    Dental  (+) Chipped, Dental Advidsory Given   Pulmonary neg pulmonary ROS   Pulmonary exam normal        Cardiovascular hypertension, negative cardio ROS Normal cardiovascular exam     Neuro/Psych  PSYCHIATRIC DISORDERS Anxiety Depression    negative neurological ROS     GI/Hepatic Neg liver ROS,GERD  Medicated,,  Endo/Other  negative endocrine ROS    Renal/GU ESRF and DialysisRenal disease     Musculoskeletal   Abdominal   Peds  Hematology negative hematology ROS (+)   Anesthesia Other Findings Past Medical History: No date: Anemia     Comment:  ESRD No date: Anxiety No date: Cervical dysplasia No date: CKD (chronic kidney disease), stage IV (HCC) No date: Depression No date: Exstrophy of bladder No date: GERD (gastroesophageal reflux disease) No date: History of kidney stones No date: Hyperlipidemia No date: Hypertension No date: Kidney stone No date: Pneumonia due to COVID-19 virus No date: PONV (postoperative nausea and vomiting) No date: Recurrent UTI No date: Seasonal allergies No date: Sepsis [facility])  Past Surgical History: 07/12/2017: ABDOMINAL HERNIA REPAIR; N/A 12/23/2015: ABDOMINAL HYSTERECTOMY; N/A No date: Boyce-Vest procedure     Comment:  age 47 05/26/2013: CERVICAL CONE BIOPSY No date: COLOSTOMY; N/A 07/19/2015: COLPOSCOPY 09/18/2017: DIAGNOSTIC LAPAROSCOPY     Comment:  abdominal wall mesh excision No date: DIALYSIS/PERMA CATHETER INSERTION No date: KIDNEY STONE SURGERY No date: NEPHROSTOMY TUBE PLACEMENT ([facility] HX); N/A No date: SMALL INTESTINE  SURGERY     Comment:  x3 No date: URETEROSIGMOIDOSTOMY  BMI    Body Mass Index: 25.39 kg/m      Reproductive/Obstetrics negative OB ROS                             Anesthesia Physical Anesthesia Plan  ASA: 3  Anesthesia Plan: General   Post-op Pain Management: Minimal or no pain anticipated   Induction: Intravenous  PONV Risk Score and Plan: 3 and Propofol  infusion, TIVA and Ondansetron   Airway Management Planned: Nasal Cannula  Additional Equipment: None  Intra-op Plan:   Post-operative Plan:   Informed Consent: I have reviewed the patients History and Physical, chart, labs and discussed the procedure including the risks, benefits and alternatives for the proposed anesthesia with the patient or authorized representative who has indicated his/her understanding and acceptance.     Dental advisory given  Plan Discussed with: CRNA and Surgeon  Anesthesia Plan Comments: (Discussed risks of anesthesia with patient, including possibility of difficulty with spontaneous ventilation under anesthesia necessitating airway intervention, PONV, and rare risks such as cardiac or respiratory or neurological events, and allergic reactions. Discussed the role of CRNA in patient's perioperative care. Patient understands.)       Anesthesia Quick Evaluation

## 2023-05-31 NOTE — Anesthesia Procedure Notes (Signed)
 Procedure Name: LMA Insertion Date/Time: 05/31/2023 7:48 AM  Performed by: Carolynn Citrin, CRNAPre-anesthesia Checklist: Patient identified, Emergency Drugs available, Suction available, Patient being monitored and Timeout performed Patient Re-evaluated:Patient Re-evaluated prior to induction Oxygen Delivery Method: Circle system utilized Preoxygenation: Pre-oxygenation with 100% oxygen Induction Type: IV induction Ventilation: Mask ventilation without difficulty LMA: LMA inserted LMA Size: 4.0 Tube type: Oral Number of attempts: 1 Placement Confirmation: positive ETCO2, CO2 detector and breath sounds checked- equal and bilateral Tube secured with: Tape Dental Injury: Teeth and Oropharynx as per pre-operative assessment

## 2023-05-31 NOTE — Op Note (Signed)
 OPERATIVE NOTE   PROCEDURE: left brachial axillary arteriovenous graft placement  PRE-OPERATIVE DIAGNOSIS: End Stage Renal Disease  POST-OPERATIVE DIAGNOSIS: End Stage Renal Disease  SURGEON: Theotis Flake Khamila Bassinger  ASSISTANT(S): Mr.Brien Pace, NP  ANESTHESIA: general  ESTIMATED BLOOD LOSS: <50 cc  FINDING(S): 3.5 mm brachial artery with a 6 to 7 mm axillary vein  SPECIMEN(S):  none  INDICATIONS:   Jessica Patterson is a 63 y.o. female who presents with end stage renal disease.  The patient is scheduled for left brachial axillary AV graft placement.  The patient is aware the risks include but are not limited to: bleeding, infection, steal syndrome, nerve damage, ischemic monomelic neuropathy, failure to mature, and need for additional procedures.  The patient is aware of the risks of the procedure and elects to proceed forward.  DESCRIPTION: After full informed written consent was obtained from the patient, the patient was brought back to the operating room and placed supine upon the operating table.  Prior to induction, the patient received IV antibiotics.   After obtaining adequate anesthesia, the patient was then prepped and draped in the standard fashion for a left arm access procedure.   A linear incision was then created along the medial border of the biceps muscle just proximal to the antecubital crease and the brachial artery which was exposed through. The brachial artery was then looped proximally and distally with Silastic Vesseloops. Side branches were controlled with 4-0 silk ties.  Attention was then turned to the exposure of the axillary vein. Linear incision was then created medial to the proximal portion of the biceps at the level of the anterior axillary crease. The axillary vein was exposed and again looped proximally and distally with Silastic vessel loops. Associated tributaries were also controlled with Silastic Vesseloops.  The Gore tunneler was then delivered  onto the field and a subcutaneous path was made from the arterial incision to the venous incision. A 4-7 tapered PTFE propatent graft by Johnathan Myron was then pulled through the subcutaneous tunnel. The arterial 4 mm portion was then approximated to the brachial artery. Brachial artery was controlled proximally and distally with the Silastic Vesseloops. Arteriotomy was made with an 11 blade scalpel and extended with Potts scissors and a 6-0 Prolene stay suture was placed. End graft to side brachial artery anastomosis was then fashioned with running CV 6 suture. Flushing maneuvers were performed suture line was hemostatic and the graft was then assessed for proper position and ease of future cannulation. Heparinized saline was infused into the vein and the graft was clamped with a vascular clamp. With the graft pressurized it was approximated to the axillary vein in its native bed and then marked with a surgical marker. The vein was then delivered into the surgical field and controlled with the Silastic vessel loops. Venotomy was then made with an 11 blade scalpel and extended with Potts scissors and a 6-0 Prolene suture was used as stay suture. The the graft was then sewn to the vein in an end graft to side vein fashion using running CV 6 suture.  Flushing maneuvers were performed and the artery was allowed to forward and back bleed.  Flow was then established through the AV graft  There was good  thrill in the venous outflow, and there was 1+ palpable radial pulse.  At this point, I irrigated out the surgical wounds.  There was no further active bleeding.  The subcutaneous tissue was reapproximated with a running stitch of 3-0 Vicryl.  The skin was then reapproximated with a running subcuticular stitch of 4-0 Vicryl.  The skin was then cleaned, dried, and reinforced with Dermabond.    The patient tolerated this procedure well.   COMPLICATIONS: None  CONDITION: Jessica Patterson  [facility]   Office: [phone]   05/31/2023, 9:45 AM

## 2023-05-31 NOTE — Interval H&P Note (Signed)
 History and Physical Interval Note:  05/31/2023 6:55 AM  Jessica Patterson  has presented today for surgery, with the diagnosis of ESRD.  The various methods of treatment have been discussed with the patient and family. After consideration of risks, benefits and other options for treatment, the patient has consented to  Procedure(s) with comments: INSERTION, GRAFT, ARTERIOVENOUS, UPPER EXTREMITY (Left) - BRACHIAL AXILLARY as a surgical intervention.  The patient's history has been reviewed, patient examined, no change in status, stable for surgery.  I have reviewed the patient's chart and labs.  Questions were answered to the patient's satisfaction.     Devon Fogo

## 2023-06-03 ENCOUNTER — Encounter: Payer: Self-pay | Admitting: Vascular Surgery

## 2023-06-04 ENCOUNTER — Encounter: Payer: Self-pay | Admitting: Vascular Surgery

## 2023-06-10 ENCOUNTER — Inpatient Hospital Stay (HOSPITAL_BASED_OUTPATIENT_CLINIC_OR_DEPARTMENT_OTHER): Admitting: Oncology

## 2023-06-10 ENCOUNTER — Ambulatory Visit

## 2023-06-10 ENCOUNTER — Inpatient Hospital Stay: Attending: Oncology

## 2023-06-10 ENCOUNTER — Ambulatory Visit: Admitting: Oncology

## 2023-06-10 ENCOUNTER — Encounter: Payer: Self-pay | Admitting: Oncology

## 2023-06-10 ENCOUNTER — Inpatient Hospital Stay

## 2023-06-10 VITALS — BP 164/83 | HR 76 | Temp 97.7°F | Resp 18 | Wt 130.7 lb

## 2023-06-10 DIAGNOSIS — R5383 Other fatigue: Secondary | ICD-10-CM | POA: Diagnosis not present

## 2023-06-10 DIAGNOSIS — I120 Hypertensive chronic kidney disease with stage 5 chronic kidney disease or end stage renal disease: Secondary | ICD-10-CM | POA: Insufficient documentation

## 2023-06-10 DIAGNOSIS — Z823 Family history of stroke: Secondary | ICD-10-CM | POA: Insufficient documentation

## 2023-06-10 DIAGNOSIS — Z801 Family history of malignant neoplasm of trachea, bronchus and lung: Secondary | ICD-10-CM | POA: Insufficient documentation

## 2023-06-10 DIAGNOSIS — Z79899 Other long term (current) drug therapy: Secondary | ICD-10-CM | POA: Insufficient documentation

## 2023-06-10 DIAGNOSIS — D631 Anemia in chronic kidney disease: Secondary | ICD-10-CM

## 2023-06-10 DIAGNOSIS — N186 End stage renal disease: Secondary | ICD-10-CM

## 2023-06-10 DIAGNOSIS — F419 Anxiety disorder, unspecified: Secondary | ICD-10-CM | POA: Diagnosis not present

## 2023-06-10 DIAGNOSIS — Z992 Dependence on renal dialysis: Secondary | ICD-10-CM | POA: Diagnosis not present

## 2023-06-10 LAB — CBC WITH DIFFERENTIAL (CANCER CENTER ONLY)
Abs Immature Granulocytes: 0.01 10*3/uL (ref 0.00–0.07)
Basophils Absolute: 0.1 10*3/uL (ref 0.0–0.1)
Basophils Relative: 1 %
Eosinophils Absolute: 0.8 10*3/uL — ABNORMAL HIGH (ref 0.0–0.5)
Eosinophils Relative: 13 %
HCT: 24.7 % — ABNORMAL LOW (ref 36.0–46.0)
Hemoglobin: 8 g/dL — ABNORMAL LOW (ref 12.0–15.0)
Immature Granulocytes: 0 %
Lymphocytes Relative: 24 %
Lymphs Abs: 1.5 10*3/uL (ref 0.7–4.0)
MCH: 30.3 pg (ref 26.0–34.0)
MCHC: 32.4 g/dL (ref 30.0–36.0)
MCV: 93.6 fL (ref 80.0–100.0)
Monocytes Absolute: 0.4 10*3/uL (ref 0.1–1.0)
Monocytes Relative: 6 %
Neutro Abs: 3.6 10*3/uL (ref 1.7–7.7)
Neutrophils Relative %: 56 %
Platelet Count: 254 10*3/uL (ref 150–400)
RBC: 2.64 MIL/uL — ABNORMAL LOW (ref 3.87–5.11)
RDW: 15.1 % (ref 11.5–15.5)
WBC Count: 6.3 10*3/uL (ref 4.0–10.5)
nRBC: 0 % (ref 0.0–0.2)

## 2023-06-10 LAB — RETIC PANEL
Immature Retic Fract: 3.9 % (ref 2.3–15.9)
RBC.: 2.58 MIL/uL — ABNORMAL LOW (ref 3.87–5.11)
Retic Count, Absolute: 26.6 10*3/uL (ref 19.0–186.0)
Retic Ct Pct: 1 % (ref 0.4–3.1)
Reticulocyte Hemoglobin: 31.3 pg (ref >27.9)

## 2023-06-10 LAB — IRON AND TIBC
Iron: 72 ug/dL (ref 28–170)
Saturation Ratios: 25 % (ref 10.4–31.8)
TIBC: 286 ug/dL (ref 250–450)
UIBC: 214 ug/dL

## 2023-06-10 LAB — FERRITIN: Ferritin: 306 ng/mL (ref 11–307)

## 2023-06-10 MED ORDER — DIPHENHYDRAMINE HCL 25 MG PO CAPS
50.0000 mg | ORAL_CAPSULE | ORAL | Status: DC | PRN
  Administered 2023-06-10: 50 mg via ORAL
  Filled 2023-06-10: qty 2

## 2023-06-10 MED ORDER — EPOETIN ALFA-EPBX 40000 UNIT/ML IJ SOLN
40000.0000 [IU] | Freq: Once | INTRAMUSCULAR | Status: AC
  Administered 2023-06-10: 40000 [IU] via SUBCUTANEOUS
  Filled 2023-06-10: qty 1

## 2023-06-10 NOTE — Assessment & Plan Note (Signed)
 I have referred to establish care with [facility] care

## 2023-06-10 NOTE — Progress Notes (Signed)
 Hematology/Oncology Progress note [phone] [phone]           REFERRING PROVIDER: Almetta Armor, MD   CHIEF COMPLAINTS/REASON FOR VISIT:  Anemia due to ESRD   ASSESSMENT & PLAN:   Anemia in ESRD (end-stage renal disease) (HCC) Labs are reviewed and discussed with patient. Lab Results  Component Value Date   HGB 8.0 (L) 06/10/2023   TIBC 286 06/10/2023   IRONPCTSAT 25 06/10/2023   FERRITIN 306 06/10/2023    -She tolerated retacrit  40,000 units with premedication-benadryl  very well.  Recent hemoglobin drop maybe due to blood loss from recent procedure. She prefers to keep same retacrit  schedule.  Retacrit  40,000 units every 4 weeks if H&H <=10.  She get IV iron treatments with her HD and ferritin is at goal.   Anxiety I have referred to establish care with [facility]   Orders Placed This Encounter  Procedures   CBC (Cancer Center Only)    Standing Status:   Future    Expected Date:   09/02/2023    Expiration Date:   06/09/2024   Iron and TIBC    Standing Status:   Future    Expected Date:   09/02/2023    Expiration Date:   06/09/2024   Ferritin    Standing Status:   Future    Expected Date:   09/02/2023    Expiration Date:   06/09/2024   Retic Panel    Standing Status:   Future    Expected Date:   09/02/2023    Expiration Date:   06/09/2024   Hemoglobin and Hematocrit (Cancer Center Only)    Standing Status:   Standing    Number of Occurrences:   2    Expiration Date:   06/09/2024   Follow up  Lab H&H in 4 weeks, 8 weeks +/- Retacrit   12 weeks lab MD +/- Retacrit .  Cbc[no diff] iron tibc ferritin retic panel.   All questions were answered. The patient knows to call the clinic with any problems, questions or concerns.  Timmy Forbes, MD, PhD [facility] Hematology Oncology 06/10/2023   HISTORY OF PRESENTING ILLNESS:   Jessica Patterson is a  63 y.o.  female with PMH listed below was seen in consultation at the request of  Almetta Armor, MD  for evaluation of anemia due to ESRD She started hemodialysis on February 09, 2023, attending sessions twice a week. She has received EPO replacement with Mircera and was noted to have adverse reaction. Per patient, the reactions include difficulty breathing and a sensation of 'everything closing up,' occurring both in the hospital and at the [facility] dialysis center. These episodes last five to eight minutes and are relieved with oxygen, but no medications like epinephrine  or steroids were administered.  Recent hospitalization due to worsening kidney function, uncontrolled HTN, and electrolyte imbalance. Was put in the ICU. Started dialysis while in the hospital (x3), and now on schedule of Tuesday/Saturday   She has a long-standing history of medical issues, including being born with exstrophy of bladder, requiring multiple surgeries. She has been under psychiatric care since a young age due to these medical challenges and is currently on Buspar and Atarax for anxiety, which helps her sleep.    INTERVAL HISTORY Jessica Patterson is a 63 y.o. female who has above history reviewed by me today presents for follow up visit for anemia in ESRD.  S/p retacrit  with premed. She tolerated well with no significant side effects.  Fatigue is slightly better.  BP is high. Recently started on Coreg.  Denies headache, vision changes, nausea vomiting.   MEDICAL HISTORY:  Past Medical History:  Diagnosis Date   Anemia    ESRD   Anxiety    Cervical dysplasia    CKD (chronic kidney disease), stage IV (HCC)    Depression    Exstrophy of bladder    GERD (gastroesophageal reflux disease)    History of kidney stones    Hyperlipidemia    Hypertension    Kidney stone    Pneumonia due to COVID-19 virus    PONV (postoperative nausea and vomiting)    Recurrent UTI    Seasonal allergies    Sepsis (HCC)     SURGICAL HISTORY: Past Surgical History:  Procedure Laterality Date   ABDOMINAL HERNIA REPAIR N/A  07/12/2017   ABDOMINAL HYSTERECTOMY N/A 12/23/2015   Boyce-Vest procedure     age 32   CERVICAL CONE BIOPSY  05/26/2013   COLOSTOMY N/A    COLPOSCOPY  07/19/2015   DIAGNOSTIC LAPAROSCOPY  09/18/2017   abdominal wall mesh excision   DIALYSIS/PERMA CATHETER INSERTION     INSERTION OF ARTERIOVENOUS (AV) ARTEGRAFT ARM Left 05/31/2023   Procedure: INSERTION, GRAFT, ARTERIOVENOUS, UPPER EXTREMITY;  Surgeon: Jackquelyn Mass, MD;  Location: [facility] ORS;  Service: Vascular;  Laterality: Left;  BRACHIAL AXILLARY   KIDNEY STONE SURGERY     NEPHROSTOMY TUBE PLACEMENT ([facility] HX) N/A    SMALL INTESTINE SURGERY     x3   URETEROSIGMOIDOSTOMY      SOCIAL HISTORY: Social History   Socioeconomic History   Marital status: Single    Spouse name: Not on file   Number of children: Not on file   Years of education: Not on file   Highest education level: Not on file  Occupational History   Not on file  Tobacco Use   Smoking status: Never   Smokeless tobacco: Never  Vaping Use   Vaping status: Never Used  Substance and Sexual Activity   Alcohol use: Never   Drug use: Never   Sexual activity: Not on file  Other Topics Concern   Not on file  Social History Narrative   Not on file   Social Drivers of Health   Financial Resource Strain: Not on file  Food Insecurity: Low Risk  (02/11/2023)   Received from [facility]   Hunger Vital Sign    Worried About Running Out of Food in the Last Year: Never true    Ran Out of Food in the Last Year: Never true  Transportation Needs: No Transportation Needs (02/11/2023)   Received from [facility]    In the past 12 months, has lack of reliable transportation kept you from medical appointments, meetings, work or from getting things needed for daily living? : No  Physical Activity: Not on file  Stress: Not on file  Social Connections: Not on file  Intimate Partner Violence: Not on file    FAMILY HISTORY: Family History  Problem  Relation Age of Onset   Lung cancer Mother    Stroke Father    Breast cancer Neg Hx     ALLERGIES:  is allergic to amlodipine, cephalosporins, codeine, hydrochlorothiazide, hydromorphone, latex, losartan potassium, metoprolol, doxycycline, egg-derived products, lisinopril, sucralfate, cholecalciferol, ciprofloxacin, clonidine hcl, diltiazem, doxazosin mesylate, hydralazine , hydralazine  hcl, iodine-131, ivp dye [iodinated contrast media], nifedipine, ranitidine hcl, sulfa antibiotics, cefuroxime axetil, diltiazem hcl, isosorbide, morphine, penicillin g, and prazosin.  MEDICATIONS:  Current Outpatient Medications  Medication Sig Dispense Refill   acetaminophen  (TYLENOL ) 325 MG tablet Take 650 mg by mouth every 6 (six) hours as needed for moderate pain (pain score 4-6).     amLODipine (NORVASC) 10 MG tablet Take 10 mg by mouth daily.     busPIRone (BUSPAR) 5 MG tablet Take 5 mg by mouth 2 (two) times daily.     carvedilol (COREG) 12.5 MG tablet Take 12.5 mg by mouth 2 (two) times daily.     cetirizine (ZYRTEC) 10 MG tablet Take 10 mg by mouth daily as needed.     cyclobenzaprine  (FLEXERIL ) 10 MG tablet Take 10 mg by mouth at bedtime as needed for muscle spasms.     Epoetin  Alfa-epbx (RETACRIT  IJ) Inject as directed. Monthly injection at cancer center     fluticasone (FLONASE) 50 MCG/ACT nasal spray Place 2 sprays into both nostrils daily as needed for allergies or rhinitis.     furosemide (LASIX) 40 MG tablet Take 40 mg by mouth See admin instructions. Take 40 mg by mouth on Sunday, Monday, Wednesday, Thursday and Friday     hydrOXYzine (ATARAX) 25 MG tablet Take 25 mg by mouth every 6 (six) hours as needed for anxiety.     montelukast (SINGULAIR) 10 MG tablet Take 10 mg by mouth daily as needed (allergies).     oxyCODONE -acetaminophen  (PERCOCET) 5-325 MG tablet Take 1 tablet by mouth every 6 (six) hours as needed for severe pain (pain score 7-10) or moderate pain (pain score 4-6). 30 tablet 0    sodium bicarbonate  650 MG tablet Take 650 mg by mouth See admin instructions. Take 650 mg by mouth 3 times daily on Sunday, Monday, Wednesday, Thursday and Friday.     No current facility-administered medications for this visit.    Review of Systems  Constitutional:  Positive for fatigue. Negative for chills and fever.  HENT:   Negative for hearing loss and voice change.   Eyes:  Negative for eye problems.  Respiratory:  Negative for chest tightness and cough.   Cardiovascular:  Negative for chest pain.  Gastrointestinal:  Negative for abdominal distention, abdominal pain and blood in stool.  Endocrine: Negative for hot flashes.  Genitourinary:  Negative for difficulty urinating and frequency.   Musculoskeletal:  Negative for arthralgias.  Skin:  Negative for itching and rash.  Neurological:  Negative for extremity weakness.  Hematological:  Negative for adenopathy.  Psychiatric/Behavioral:  Negative for confusion.    PHYSICAL EXAMINATION: ECOG PERFORMANCE STATUS: 1 - Symptomatic but completely ambulatory Vitals:   06/10/23 1014 06/10/23 1028  BP: (!) 176/84 (!) 164/83  Pulse: 76   Resp: 18   Temp: 97.7 F (36.5 C)   SpO2: 98%    Filed Weights   06/10/23 1014  Weight: 130 lb 11.2 oz (59.3 kg)    Physical Exam Constitutional:      General: She is not in acute distress. HENT:     Head: Normocephalic and atraumatic.  Eyes:     General: No scleral icterus. Cardiovascular:     Rate and Rhythm: Normal rate and regular rhythm.     Heart sounds: Normal heart sounds.  Pulmonary:     Effort: Pulmonary effort is normal. No respiratory distress.     Breath sounds: No wheezing.  Abdominal:     General: Bowel sounds are normal. There is no distension.     Palpations: Abdomen is soft.  Musculoskeletal:        General: No deformity. Normal  range of motion.     Cervical back: Normal range of motion and neck supple.  Skin:    General: Skin is warm and dry.     Findings: No  erythema or rash.  Neurological:     Mental Status: She is alert and oriented to person, place, and time. Mental status is at baseline.  Psychiatric:        Mood and Affect: Mood normal.     LABORATORY DATA:  I have reviewed the data as listed    Latest Ref Rng & Units 06/10/2023    9:59 AM 05/31/2023    7:08 AM 05/24/2023   11:05 AM  CBC  WBC 4.0 - 10.5 K/uL 6.3   7.8   Hemoglobin 12.0 - 15.0 g/dL 8.0  8.8  8.7   Hematocrit 36.0 - 46.0 % 24.7  26.0  26.2   Platelets 150 - 400 K/uL 254   183       Latest Ref Rng & Units 05/31/2023    7:08 AM 05/24/2023   11:05 AM 09/22/2019    6:31 AM  CMP  Glucose 70 - 99 mg/dL 93  83  621   BUN 8 - 23 mg/dL 62  75  49   Creatinine 0.44 - 1.00 mg/dL 3.08  6.57  8.46   Sodium 135 - 145 mmol/L 137  136  138   Potassium 3.5 - 5.1 mmol/L 4.9  5.6  4.1   Chloride 98 - 111 mmol/L 111  106  113   CO2 22 - 32 mmol/L  19  17   Calcium 8.9 - 10.3 mg/dL  7.9  8.4   Total Protein 6.5 - 8.1 g/dL   6.4   Total Bilirubin 0.3 - 1.2 mg/dL   0.5   Alkaline Phos 38 - 126 U/L   80   AST 15 - 41 U/L   13   ALT 0 - 44 U/L   10       RADIOGRAPHIC STUDIES: I have personally reviewed the radiological images as listed and agreed with the findings in the report. VAS US  UPPER EXTREMITY ARTERIAL DUPLEX Result Date: 05/06/2023  UPPER EXTREMITY DUPLEX STUDY Patient Name:  RONNA VAUGHT  Date of Exam:   05/02/2023 Medical Rec #: [id number]       Accession #:    [id number] Date of Birth: 1960/12/22       Patient Gender: F Patient Age:   68 years Exam Location:  [facility] Procedure:      VAS US  UPPER EXTREMITY ARTERIAL DUPLEX Referring Phys: Sharla Davis --------------------------------------------------------------------------------  Indications: Evaluation prior to placement of dialysis access. History:     Patient has a history of CKD 4.  Risk Factors: Hypertension, no history of smoking. Performing Technologist: Oneta Bilberry RVT  Examination Guidelines: A  complete evaluation includes B-mode imaging, spectral Doppler, color Doppler, and power Doppler as needed of all accessible portions of each vessel. Bilateral testing is considered an integral part of a complete examination. Limited examinations for reoccurring indications may be performed as noted.  Right Doppler Findings: +-------------+----------+--------+--------+--------+ Site         PSV (cm/s)WaveformStenosisComments +-------------+----------+--------+--------+--------+ Brachial Dist76                                 +-------------+----------+--------+--------+--------+ Radial Dist  101                                +-------------+----------+--------+--------+--------+  Ulnar Dist   91                                 +-------------+----------+--------+--------+--------+   Right Pre-Dialysis Findings: +-----------------------+----------+--------------------+---------+--------+ Location               PSV (cm/s)Intralum. Diam. (cm)Waveform Comments +-----------------------+----------+--------------------+---------+--------+ Brachial Antecub. fossa76        0.43                triphasic         +-----------------------+----------+--------------------+---------+--------+ Radial Art at Wrist    101       0.20                triphasic         +-----------------------+----------+--------------------+---------+--------+ Ulnar Art at Wrist     91        0.17                triphasic         +-----------------------+----------+--------------------+---------+--------+  Left Doppler Findings: +-------------+----------+--------+--------+--------+ Site         PSV (cm/s)WaveformStenosisComments +-------------+----------+--------+--------+--------+ Brachial Dist96                                 +-------------+----------+--------+--------+--------+ Radial Dist  103                                +-------------+----------+--------+--------+--------+ Ulnar Dist    90                                 +-------------+----------+--------+--------+--------+   Left Pre-Dialysis Findings: +-----------------------+----------+--------------------+---------+--------+ Location               PSV (cm/s)Intralum. Diam. (cm)Waveform Comments +-----------------------+----------+--------------------+---------+--------+ Brachial Antecub. fossa96        0.39                triphasic         +-----------------------+----------+--------------------+---------+--------+ Radial Art at Wrist    103       0.18                triphasic         +-----------------------+----------+--------------------+---------+--------+ Ulnar Art at Wrist     90        0.14                triphasic         +-----------------------+----------+--------------------+---------+--------+  Summary:  Right: No obstruction visualized in the right upper extremity. Left: No obstruction visualized in the left upper extremity. *See table(s) above for measurements and observations. Electronically signed by Devon Fogo MD on 05/06/2023 at 2:35:27 PM.    Final    VAS US  UPPER EXT VEIN MAPPING (PRE-OP  AVF) Result Date: 05/06/2023 UPPER EXTREMITY VEIN MAPPING Patient Name:  KAEDENCE [facility]  Date of Exam:   05/02/2023 Medical Rec #: [id number]       Accession #:    [id number] Date of Birth: 1960/02/27       Patient Gender: F Patient Age:   43 years Exam Location:  [facility] Procedure:      VAS US  UPPER EXT VEIN MAPPING (PRE-OP  AVF) Referring Phys: Sharla Davis --------------------------------------------------------------------------------  Indications: Evaluation prior to placement of dialysis access. Performing  Technologist: Oneta Bilberry RVT  Examination Guidelines: A complete evaluation includes B-mode imaging, spectral Doppler, color Doppler, and power Doppler as needed of all accessible portions of each vessel. Bilateral testing is considered an integral part of a complete examination.  Limited examinations for reoccurring indications may be performed as noted. +-----------------+-------------+----------+--------+ Right Cephalic   Diameter (cm)Depth (cm)Findings +-----------------+-------------+----------+--------+ Shoulder             0.13                        +-----------------+-------------+----------+--------+ Prox upper arm       0.15                        +-----------------+-------------+----------+--------+ Mid upper arm        0.11                        +-----------------+-------------+----------+--------+ Dist upper arm       0.14                        +-----------------+-------------+----------+--------+ Antecubital fossa    0.12                        +-----------------+-------------+----------+--------+ Prox forearm         0.22                        +-----------------+-------------+----------+--------+ Mid forearm          0.17                        +-----------------+-------------+----------+--------+ Dist forearm         0.17                        +-----------------+-------------+----------+--------+ +-----------------+-------------+----------+--------+ Right Basilic    Diameter (cm)Depth (cm)Findings +-----------------+-------------+----------+--------+ Prox upper arm       0.30                        +-----------------+-------------+----------+--------+ Mid upper arm        0.30                        +-----------------+-------------+----------+--------+ Dist upper arm       0.28                        +-----------------+-------------+----------+--------+ Antecubital fossa    0.26                        +-----------------+-------------+----------+--------+ Prox forearm         0.22                        +-----------------+-------------+----------+--------+ +-----------------+-------------+----------+--------+ Left Cephalic    Diameter (cm)Depth (cm)Findings  +-----------------+-------------+----------+--------+ Shoulder             0.16                        +-----------------+-------------+----------+--------+ Prox upper arm       0.17                        +-----------------+-------------+----------+--------+ Mid upper  arm        0.13                        +-----------------+-------------+----------+--------+ Dist upper arm       0.16                        +-----------------+-------------+----------+--------+ Antecubital fossa    0.16                        +-----------------+-------------+----------+--------+ Prox forearm         0.18                        +-----------------+-------------+----------+--------+ Mid forearm          0.22                        +-----------------+-------------+----------+--------+ Dist forearm         0.11                        +-----------------+-------------+----------+--------+ +-----------------+-------------+----------+--------+ Left Basilic     Diameter (cm)Depth (cm)Findings +-----------------+-------------+----------+--------+ Prox upper arm       0.12                        +-----------------+-------------+----------+--------+ Mid upper arm        0.17                        +-----------------+-------------+----------+--------+ Dist upper arm       0.11                        +-----------------+-------------+----------+--------+ Antecubital fossa    0.13                        +-----------------+-------------+----------+--------+ Prox forearm         0.12                        +-----------------+-------------+----------+--------+ Summary: Right: Cephalic and Basilic veins were identified with diameters as        described above. Left: Cephalic and Basilic veins were identified with diameters as       described above. *See table(s) above for measurements and observations.  Diagnosing physician: Devon Fogo MD Electronically signed by Devon Fogo  MD on 05/06/2023 at 2:34:42 PM.    Final

## 2023-06-10 NOTE — Patient Instructions (Signed)

## 2023-06-10 NOTE — Assessment & Plan Note (Addendum)
 Labs are reviewed and discussed with patient. Lab Results  Component Value Date   HGB 8.0 (L) 06/10/2023   TIBC 286 06/10/2023   IRONPCTSAT 25 06/10/2023   FERRITIN 306 06/10/2023    -She tolerated retacrit  40,000 units with premedication-benadryl  very well.  Recent hemoglobin drop maybe due to blood loss from recent procedure. She prefers to keep same retacrit  schedule.  Retacrit  40,000 units every 4 weeks if H&H <=10.  She get IV iron treatments with her HD and ferritin is at goal.

## 2023-06-20 ENCOUNTER — Other Ambulatory Visit (INDEPENDENT_AMBULATORY_CARE_PROVIDER_SITE_OTHER): Payer: Self-pay | Admitting: Vascular Surgery

## 2023-06-20 DIAGNOSIS — N186 End stage renal disease: Secondary | ICD-10-CM

## 2023-06-20 DIAGNOSIS — Z95828 Presence of other vascular implants and grafts: Secondary | ICD-10-CM

## 2023-06-21 ENCOUNTER — Ambulatory Visit (INDEPENDENT_AMBULATORY_CARE_PROVIDER_SITE_OTHER)

## 2023-06-21 ENCOUNTER — Encounter (INDEPENDENT_AMBULATORY_CARE_PROVIDER_SITE_OTHER): Payer: Self-pay | Admitting: Nurse Practitioner

## 2023-06-21 ENCOUNTER — Ambulatory Visit (INDEPENDENT_AMBULATORY_CARE_PROVIDER_SITE_OTHER): Admitting: Nurse Practitioner

## 2023-06-21 VITALS — BP 170/82 | HR 81 | Resp 18 | Ht 60.0 in | Wt 133.2 lb

## 2023-06-21 DIAGNOSIS — Z95828 Presence of other vascular implants and grafts: Secondary | ICD-10-CM | POA: Diagnosis not present

## 2023-06-21 DIAGNOSIS — N186 End stage renal disease: Secondary | ICD-10-CM

## 2023-06-21 DIAGNOSIS — Z992 Dependence on renal dialysis: Secondary | ICD-10-CM

## 2023-06-21 DIAGNOSIS — L259 Unspecified contact dermatitis, unspecified cause: Secondary | ICD-10-CM

## 2023-06-23 NOTE — Progress Notes (Signed)
 Subjective:    Patient ID: Jessica Patterson, female    DOB: Oct 19, 1960, 63 y.o.   MRN: [id number] Chief Complaint  Patient presents with   Follow-up    fu 3 weeks + HDA    Patient returns today for follow-up evaluation of her left brachial axillary AV graft placement on 05/31/2023.  Incision is healed but she does note that there is some tenderness present.  She also has a area on her side which is very itchy for her.  This started over the last couple of days.  Currently there are no open wounds or ulcerations or drainage from her incision sites.  She has a good thrill and bruit.  Today noninvasive studies have a flow volume of 1299.  There are elevated velocities near the anastomosis but I suspect this is more so related to the angle of the graft given the patient's small arm.    Review of Systems  Skin:  Positive for rash.  All other systems reviewed and are negative.      Objective:    Physical Exam Vitals reviewed.  HENT:     Head: Normocephalic.  Cardiovascular:     Rate and Rhythm: Normal rate.     Pulses: Normal pulses.  Pulmonary:     Effort: Pulmonary effort is normal.  Skin:    General: Skin is warm and dry.     Findings: Rash present.  Neurological:     Mental Status: She is alert and oriented to person, place, and time.  Psychiatric:        Mood and Affect: Mood normal.        Behavior: Behavior normal.        Thought Content: Thought content normal.        Judgment: Judgment normal.     BP (!) 170/82   Pulse 81   Resp 18   Ht 5' (1.524 m)   Wt 133 lb 3.2 oz (60.4 kg)   BMI 26.01 kg/m   Past Medical History:  Diagnosis Date   Anemia    ESRD   Anxiety    Cervical dysplasia    CKD (chronic kidney disease), stage IV (HCC)    Depression    Exstrophy of bladder    GERD (gastroesophageal reflux disease)    History of kidney stones    Hyperlipidemia    Hypertension    Kidney stone    Pneumonia due to COVID-19 virus    PONV (postoperative nausea  and vomiting)    Recurrent UTI    Seasonal allergies    Sepsis (HCC)     Social History   Socioeconomic History   Marital status: Single    Spouse name: Not on file   Number of children: Not on file   Years of education: Not on file   Highest education level: Not on file  Occupational History   Not on file  Tobacco Use   Smoking status: Never   Smokeless tobacco: Never  Vaping Use   Vaping status: Never Used  Substance and Sexual Activity   Alcohol use: Never   Drug use: Never   Sexual activity: Not on file  Other Topics Concern   Not on file  Social History Narrative   Not on file   Social Drivers of Health   Financial Resource Strain: Not on file  Food Insecurity: Low Risk  (02/11/2023)   Received from [facility]   Hunger Vital Sign    Worried About Running  Out of Food in the Last Year: Never true    Ran Out of Food in the Last Year: Never true  Transportation Needs: No Transportation Needs (02/11/2023)   Received from [facility]    In the past 12 months, has lack of reliable transportation kept you from medical appointments, meetings, work or from getting things needed for daily living? : No  Physical Activity: Not on file  Stress: Not on file  Social Connections: Not on file  Intimate Partner Violence: Not on file    Past Surgical History:  Procedure Laterality Date   ABDOMINAL HERNIA REPAIR N/A 07/12/2017   ABDOMINAL HYSTERECTOMY N/A 12/23/2015   Boyce-Vest procedure     age 56   CERVICAL CONE BIOPSY  05/26/2013   COLOSTOMY N/A    COLPOSCOPY  07/19/2015   DIAGNOSTIC LAPAROSCOPY  09/18/2017   abdominal wall mesh excision   DIALYSIS/PERMA CATHETER INSERTION     INSERTION OF ARTERIOVENOUS (AV) ARTEGRAFT ARM Left 05/31/2023   Procedure: INSERTION, GRAFT, ARTERIOVENOUS, UPPER EXTREMITY;  Surgeon: Jackquelyn Mass, MD;  Location: [facility] ORS;  Service: Vascular;  Laterality: Left;  BRACHIAL AXILLARY   KIDNEY STONE SURGERY     NEPHROSTOMY  TUBE PLACEMENT ([facility] HX) N/A    SMALL INTESTINE SURGERY     x3   URETEROSIGMOIDOSTOMY      Family History  Problem Relation Age of Onset   Lung cancer Mother    Stroke Father    Breast cancer Neg Hx     Allergies  Allergen Reactions   Amlodipine Shortness Of Breath and Other (See Comments)   Cephalosporins Shortness Of Breath and Nausea And Vomiting    Has tolerated 2nd (CEFOXITIN) and 3rd (CEFPODOXIME) generation cephalosporins in the past without documented ADRs.    Codeine Shortness Of Breath and Nausea And Vomiting   Hydrochlorothiazide Shortness Of Breath and Other (See Comments)   Hydromorphone Hives, Nausea And Vomiting and Other (See Comments)    Other Reaction(s): GI Intolerance   Latex Itching and Rash    Other reaction(s): Itching  Other reaction(s): Itching    Other reaction(s): Itching Other reaction(s): Itching   Losartan Potassium Shortness Of Breath and Other (See Comments)    Other reaction(s): Shortness Of Breath Other reaction(s): Shortness Of Breath    Metoprolol Other (See Comments)   Doxycycline Hives and Rash    Other reaction(s): Hives Other reaction(s): Hives    Egg-Derived Products Nausea And Vomiting   Lisinopril     Other Reaction(s): cough (is bothersome, but cannot tolerate any other meds - see rest of reactions)   Sucralfate Rash   Cholecalciferol Other (See Comments)   Ciprofloxacin Other (See Comments)   Clonidine Hcl Nausea And Vomiting   Diltiazem    Doxazosin Mesylate Other (See Comments)   Hydralazine     Hydralazine  Hcl Other (See Comments)   Iodine-131 Other (See Comments)    Can not receive due to Kidney disease Can not receive due to Kidney disease    Ivp Dye [Iodinated Contrast Media]    Nifedipine     Other Reaction(s): Other (See Comments)  CHEST PAIN ACHING   Ranitidine Hcl Other (See Comments)   Sulfa Antibiotics    Cefuroxime Axetil Nausea And Vomiting   Diltiazem Hcl Nausea Only    Other Reaction(s): GI  Intolerance   Isosorbide Nausea And Vomiting and Other (See Comments)    Other Reaction(s): GI Intolerance   Morphine Hives, Nausea And Vomiting and Other (See Comments)  Other Reaction(s): GI Intolerance   Penicillin G Rash    welts    Prazosin Other (See Comments)    Headaches       Latest Ref Rng & Units 06/10/2023    9:59 AM 05/31/2023    7:08 AM 05/24/2023   11:05 AM  CBC  WBC 4.0 - 10.5 K/uL 6.3   7.8   Hemoglobin 12.0 - 15.0 g/dL 8.0  8.8  8.7   Hematocrit 36.0 - 46.0 % 24.7  26.0  26.2   Platelets 150 - 400 K/uL 254   183        CMP     Component Value Date/Time   NA 137 05/31/2023 0708   K 4.9 05/31/2023 0708   CL 111 05/31/2023 0708   CO2 19 (L) 05/24/2023 1105   GLUCOSE 93 05/31/2023 0708   BUN 62 (H) 05/31/2023 0708   CREATININE 8.80 (H) 05/31/2023 0708   CALCIUM 7.9 (L) 05/24/2023 1105   PROT 6.4 (L) 09/22/2019 0631   ALBUMIN 3.1 (L) 09/22/2019 0631   AST 13 (L) 09/22/2019 0631   ALT 10 09/22/2019 0631   ALKPHOS 80 09/22/2019 0631   BILITOT 0.5 09/22/2019 0631   GFRNONAA 5 (L) 05/24/2023 1105     No results found.     Assessment & Plan:   1. ESRD on dialysis [facility]) (Primary) Recommend:  The patient is doing well and currently has adequate dialysis access.  She still has some tenderness around the area.  We will allow him to begin using in the beginning of June and we will send a letter to that effect.  The patient should have a duplex ultrasound of the dialysis access in 3 months. The patient will follow-up with me in the office after each ultrasound    2. Contact dermatitis, unspecified contact dermatitis type, unspecified trigger The patient had a rash under her arm area.  I suspect this is contact dermatitis.  She is advised to utilize hydrocortisone cream as well as Claritin  to help.  She is advised that if it worsens or this does not improve to contact her PCP for further workup   Current Outpatient Medications on File Prior to Visit   Medication Sig Dispense Refill   acetaminophen  (TYLENOL ) 325 MG tablet Take 650 mg by mouth every 6 (six) hours as needed for moderate pain (pain score 4-6).     amLODipine (NORVASC) 10 MG tablet Take 10 mg by mouth daily.     busPIRone (BUSPAR) 5 MG tablet Take 5 mg by mouth 2 (two) times daily.     carvedilol (COREG) 12.5 MG tablet Take 12.5 mg by mouth 2 (two) times daily.     cetirizine (ZYRTEC) 10 MG tablet Take 10 mg by mouth daily as needed.     cyclobenzaprine  (FLEXERIL ) 10 MG tablet Take 10 mg by mouth at bedtime as needed for muscle spasms.     Epoetin  Alfa-epbx (RETACRIT  IJ) Inject as directed. Monthly injection at cancer center     fluticasone (FLONASE) 50 MCG/ACT nasal spray Place 2 sprays into both nostrils daily as needed for allergies or rhinitis.     furosemide (LASIX) 40 MG tablet Take 40 mg by mouth See admin instructions. Take 40 mg by mouth on Sunday, Monday, Wednesday, Thursday and Friday     hydrOXYzine (ATARAX) 25 MG tablet Take 25 mg by mouth every 6 (six) hours as needed for anxiety.     montelukast (SINGULAIR) 10 MG tablet Take 10 mg by mouth  daily as needed (allergies).     oxyCODONE -acetaminophen  (PERCOCET) 5-325 MG tablet Take 1 tablet by mouth every 6 (six) hours as needed for severe pain (pain score 7-10) or moderate pain (pain score 4-6). 30 tablet 0   sodium bicarbonate  650 MG tablet Take 650 mg by mouth See admin instructions. Take 650 mg by mouth 3 times daily on Sunday, Monday, Wednesday, Thursday and Friday.     No current facility-administered medications on file prior to visit.    There are no Patient Instructions on file for this visit. No follow-ups on file.   Zaid Tomes E Eveny Anastas, NP

## 2023-06-25 ENCOUNTER — Encounter (INDEPENDENT_AMBULATORY_CARE_PROVIDER_SITE_OTHER): Payer: Self-pay

## 2023-07-08 ENCOUNTER — Inpatient Hospital Stay: Attending: Oncology

## 2023-07-08 ENCOUNTER — Inpatient Hospital Stay

## 2023-07-08 VITALS — BP 174/80 | HR 80 | Temp 97.4°F | Resp 18

## 2023-07-08 DIAGNOSIS — D631 Anemia in chronic kidney disease: Secondary | ICD-10-CM | POA: Insufficient documentation

## 2023-07-08 DIAGNOSIS — F419 Anxiety disorder, unspecified: Secondary | ICD-10-CM | POA: Insufficient documentation

## 2023-07-08 DIAGNOSIS — N186 End stage renal disease: Secondary | ICD-10-CM | POA: Diagnosis present

## 2023-07-08 DIAGNOSIS — Z823 Family history of stroke: Secondary | ICD-10-CM | POA: Insufficient documentation

## 2023-07-08 DIAGNOSIS — Z992 Dependence on renal dialysis: Secondary | ICD-10-CM | POA: Diagnosis not present

## 2023-07-08 DIAGNOSIS — Z79899 Other long term (current) drug therapy: Secondary | ICD-10-CM | POA: Insufficient documentation

## 2023-07-08 DIAGNOSIS — R5383 Other fatigue: Secondary | ICD-10-CM | POA: Diagnosis not present

## 2023-07-08 DIAGNOSIS — I120 Hypertensive chronic kidney disease with stage 5 chronic kidney disease or end stage renal disease: Secondary | ICD-10-CM | POA: Insufficient documentation

## 2023-07-08 DIAGNOSIS — Z801 Family history of malignant neoplasm of trachea, bronchus and lung: Secondary | ICD-10-CM | POA: Insufficient documentation

## 2023-07-08 LAB — HEMOGLOBIN AND HEMATOCRIT (CANCER CENTER ONLY)
HCT: 24.6 % — ABNORMAL LOW (ref 36.0–46.0)
Hemoglobin: 7.9 g/dL — ABNORMAL LOW (ref 12.0–15.0)

## 2023-07-08 MED ORDER — DIPHENHYDRAMINE HCL 25 MG PO CAPS
50.0000 mg | ORAL_CAPSULE | ORAL | Status: DC | PRN
  Administered 2023-07-08: 25 mg via ORAL
  Filled 2023-07-08: qty 2

## 2023-07-08 MED ORDER — EPOETIN ALFA-EPBX 40000 UNIT/ML IJ SOLN
40000.0000 [IU] | Freq: Once | INTRAMUSCULAR | Status: AC
  Administered 2023-07-08: 40000 [IU] via SUBCUTANEOUS
  Filled 2023-07-08: qty 1

## 2023-07-08 NOTE — Progress Notes (Signed)
 Per patient's request, and Dr. Jackqueline Mason approval. only gave 25 mg of benadryl  PO as pre med today.

## 2023-07-10 ENCOUNTER — Encounter: Payer: Self-pay | Admitting: Oncology

## 2023-07-10 NOTE — Progress Notes (Signed)
 After multiple attempts, we have been unable to reach this patient to enroll in services.  Referral closed on 07/08/2023.  [facility] remains available should the patient express interest in the future.

## 2023-07-23 ENCOUNTER — Telehealth (INDEPENDENT_AMBULATORY_CARE_PROVIDER_SITE_OTHER): Payer: Self-pay

## 2023-07-23 NOTE — Telephone Encounter (Signed)
I attempted to contact the patient to schedule a permcath removal. A message was left for a return call. 

## 2023-08-05 ENCOUNTER — Inpatient Hospital Stay

## 2023-08-05 ENCOUNTER — Telehealth (INDEPENDENT_AMBULATORY_CARE_PROVIDER_SITE_OTHER): Payer: Self-pay

## 2023-08-05 VITALS — BP 188/82 | HR 84 | Temp 98.9°F

## 2023-08-05 DIAGNOSIS — D631 Anemia in chronic kidney disease: Secondary | ICD-10-CM

## 2023-08-05 DIAGNOSIS — I120 Hypertensive chronic kidney disease with stage 5 chronic kidney disease or end stage renal disease: Secondary | ICD-10-CM | POA: Diagnosis not present

## 2023-08-05 LAB — HEMOGLOBIN AND HEMATOCRIT (CANCER CENTER ONLY)
HCT: 23.6 % — ABNORMAL LOW (ref 36.0–46.0)
Hemoglobin: 7.7 g/dL — ABNORMAL LOW (ref 12.0–15.0)

## 2023-08-05 MED ORDER — DIPHENHYDRAMINE HCL 25 MG PO CAPS: 50.0000 mg | ORAL_CAPSULE | ORAL | Status: DC | PRN

## 2023-08-05 MED ORDER — EPOETIN ALFA-EPBX 40000 UNIT/ML IJ SOLN
40000.0000 [IU] | Freq: Once | INTRAMUSCULAR | Status: AC
  Administered 2023-08-05: 40000 [IU] via SUBCUTANEOUS
  Filled 2023-08-05: qty 1

## 2023-08-05 MED ORDER — DIPHENHYDRAMINE HCL 25 MG PO CAPS
25.0000 mg | ORAL_CAPSULE | Freq: Once | ORAL | Status: AC
  Administered 2023-08-05: 25 mg via ORAL
  Filled 2023-08-05: qty 1

## 2023-08-05 NOTE — Telephone Encounter (Signed)
 As long as she can still dialyze she doesn't need a visit.  Unfortunately, infiltrations are not uncommon.  I would advise her to elevate her arm and ice it to help.

## 2023-08-05 NOTE — Progress Notes (Signed)
 B/P elevated and pt reports feeling anxious. Left upper arm is bruised and swollen with a pain level of 8 put of 10. Pt reports that graph site infiltrated at dialysis on Saturday and she has been waiting for a call back, but plans to stop by Dr. Kathryn office today once leaving here. Dr. Babara made aware via secure chat.   Per Dr. Babara proceed with retacrit  and okay to give 25 mg Benadryl  instead of 50 mg per pt. RequestSABRA Pickles NP from Dr. Kathryn office suggests through secure chat for pt to elevate and ice to help with discomfort. Pt aware and would like to discuss further with their office, Pickles NP made aware.   1448: Pt stable at discharge. Pt educated to monitor b/p at home and seek emergency care if any symptoms arise. Pt verbalizes understanding.

## 2023-08-05 NOTE — Telephone Encounter (Signed)
 Patient left a message stating that her left arm graft was infiltrated on the Saturday. Patient stated that her arm is painful, swollen, and purple. Patient would like to know should she be seen in the office. Please Advise

## 2023-08-05 NOTE — Telephone Encounter (Signed)
 Patient was notified with medical recommendations and verbalized understanding

## 2023-08-27 ENCOUNTER — Telehealth (INDEPENDENT_AMBULATORY_CARE_PROVIDER_SITE_OTHER): Payer: Self-pay

## 2023-08-27 NOTE — Telephone Encounter (Signed)
 Spoke with the patient and she is scheduled with Dr. Marea for a permcath removal on 09/02/23 with a 2:00 pm arrival time to the [facility]. Pre-procedure instructions were discussed and will be sent to Mychart and mailed.

## 2023-08-27 NOTE — Telephone Encounter (Signed)
 Patient called back to reschedule her permcath removal from 09/02/23 to 09/05/23 with a 2:00 pm arrival time to the [facility].

## 2023-09-02 ENCOUNTER — Encounter: Payer: Self-pay | Admitting: Oncology

## 2023-09-02 ENCOUNTER — Inpatient Hospital Stay (HOSPITAL_BASED_OUTPATIENT_CLINIC_OR_DEPARTMENT_OTHER): Admitting: Oncology

## 2023-09-02 ENCOUNTER — Inpatient Hospital Stay: Attending: Oncology

## 2023-09-02 ENCOUNTER — Inpatient Hospital Stay

## 2023-09-02 VITALS — BP 186/88 | HR 88 | Temp 97.3°F | Resp 18 | Wt 131.9 lb

## 2023-09-02 DIAGNOSIS — N186 End stage renal disease: Secondary | ICD-10-CM | POA: Diagnosis not present

## 2023-09-02 DIAGNOSIS — D631 Anemia in chronic kidney disease: Secondary | ICD-10-CM | POA: Insufficient documentation

## 2023-09-02 DIAGNOSIS — I120 Hypertensive chronic kidney disease with stage 5 chronic kidney disease or end stage renal disease: Secondary | ICD-10-CM | POA: Diagnosis present

## 2023-09-02 DIAGNOSIS — I10 Essential (primary) hypertension: Secondary | ICD-10-CM

## 2023-09-02 LAB — IRON AND TIBC
Iron: 65 ug/dL (ref 28–170)
Saturation Ratios: 25 % (ref 10.4–31.8)
TIBC: 262 ug/dL (ref 250–450)
UIBC: 197 ug/dL

## 2023-09-02 LAB — CBC (CANCER CENTER ONLY)
HCT: 22.7 % — ABNORMAL LOW (ref 36.0–46.0)
Hemoglobin: 7.4 g/dL — ABNORMAL LOW (ref 12.0–15.0)
MCH: 31 pg (ref 26.0–34.0)
MCHC: 32.6 g/dL (ref 30.0–36.0)
MCV: 95 fL (ref 80.0–100.0)
Platelet Count: 220 K/uL (ref 150–400)
RBC: 2.39 MIL/uL — ABNORMAL LOW (ref 3.87–5.11)
RDW: 15.1 % (ref 11.5–15.5)
WBC Count: 7.9 K/uL (ref 4.0–10.5)
nRBC: 0 % (ref 0.0–0.2)

## 2023-09-02 LAB — RETIC PANEL
Immature Retic Fract: 7.3 % (ref 2.3–15.9)
RBC.: 2.37 MIL/uL — ABNORMAL LOW (ref 3.87–5.11)
Retic Count, Absolute: 22.5 K/uL (ref 19.0–186.0)
Retic Ct Pct: 1 % (ref 0.4–3.1)
Reticulocyte Hemoglobin: 33.5 pg (ref >27.9)

## 2023-09-02 LAB — FERRITIN: Ferritin: 313 ng/mL — ABNORMAL HIGH (ref 11–307)

## 2023-09-02 MED ORDER — EPOETIN ALFA-EPBX 40000 UNIT/ML IJ SOLN
40000.0000 [IU] | Freq: Once | INTRAMUSCULAR | Status: AC
  Administered 2023-09-02: 40000 [IU] via SUBCUTANEOUS
  Filled 2023-09-02: qty 1

## 2023-09-02 MED ORDER — DIPHENHYDRAMINE HCL 25 MG PO CAPS
50.0000 mg | ORAL_CAPSULE | ORAL | Status: DC | PRN
  Administered 2023-09-02: 25 mg via ORAL
  Filled 2023-09-02: qty 2

## 2023-09-02 NOTE — Patient Instructions (Signed)

## 2023-09-02 NOTE — Assessment & Plan Note (Addendum)
 Labs are reviewed and discussed with patient. Lab Results  Component Value Date   HGB 7.4 (L) 09/02/2023   TIBC 262 09/02/2023   IRONPCTSAT 25 09/02/2023   FERRITIN 313 (H) 09/02/2023    -She tolerated retacrit  40,000 units with premedication-benadryl  very well.  Hemoglobin remains low.  Recommend Retacrit  40,000 units every 2 weeks if H&H <=10.  She get IV iron treatments with her HD and ferritin is at goal.

## 2023-09-02 NOTE — Progress Notes (Signed)
 Hematology/Oncology Progress note [phone] [phone]           REFERRING PROVIDER: Ricard Tawni KIDD, MD   CHIEF COMPLAINTS/REASON FOR VISIT:  Anemia due to ESRD   ASSESSMENT & PLAN:   Anemia in ESRD (end-stage renal disease) (HCC) Labs are reviewed and discussed with patient. Lab Results  Component Value Date   HGB 7.4 (L) 09/02/2023   TIBC 262 09/02/2023   IRONPCTSAT 25 09/02/2023   FERRITIN 313 (H) 09/02/2023    -She tolerated retacrit  40,000 units with premedication-benadryl  very well.  Hemoglobin remains low.  Recommend Retacrit  40,000 units every 2 weeks if H&H <=10.  She get IV iron treatments with her HD and ferritin is at goal.   Uncontrolled hypertension Recommend patient to monitor BP closely follow-up with primary care provider and nephrology.   Orders Placed This Encounter  Procedures   CBC (Cancer Center Only)    Standing Status:   Future    Expected Date:   10/28/2023    Expiration Date:   01/26/2024   Iron and TIBC    Standing Status:   Future    Expected Date:   10/28/2023    Expiration Date:   01/26/2024   Ferritin    Standing Status:   Future    Expected Date:   10/28/2023    Expiration Date:   01/26/2024   Retic Panel    Standing Status:   Future    Expected Date:   10/28/2023    Expiration Date:   01/26/2024   Hemoglobin and Hematocrit (Cancer Center Only)    Standing Status:   Standing    Number of Occurrences:   3    Expiration Date:   09/01/2024   Follow up  Lab H&H in 2 weeks, 4 weeks, 6 weeks +/- Retacrit   8 weeks lab MD +/- Retacrit .  Cbc[no diff] iron tibc ferritin retic panel.   All questions were answered. The patient knows to call the clinic with any problems, questions or concerns.  Zelphia Cap, MD, PhD [facility] Hematology Oncology 09/02/2023   HISTORY OF PRESENTING ILLNESS:   Jessica Patterson is a  63 y.o.  female with PMH listed below was seen in consultation at the request of  Ricard Tawni KIDD, MD  for evaluation of anemia due to ESRD She started hemodialysis on February 09, 2023, attending sessions twice a week. She has received EPO replacement with Mircera and was noted to have adverse reaction. Per patient, the reactions include difficulty breathing and a sensation of 'everything closing up,' occurring both in the hospital and at the [facility] dialysis center. These episodes last five to eight minutes and are relieved with oxygen, but no medications like epinephrine  or steroids were administered.  Recent hospitalization due to worsening kidney function, uncontrolled HTN, and electrolyte imbalance. Was put in the ICU. Started dialysis while in the hospital (x3), and now on schedule of Tuesday/Saturday   She has a long-standing history of medical issues, including being born with exstrophy of bladder, requiring multiple surgeries. She has been under psychiatric care since a young age due to these medical challenges and is currently on Buspar and Atarax for anxiety, which helps her sleep.    INTERVAL HISTORY Jessica Patterson is a 63 y.o. female who has above history reviewed by me today presents for follow up visit for anemia in ESRD.  BP is high.  Denies headache, vision changes, nausea vomiting.   MEDICAL HISTORY:  Past Medical History:  Diagnosis Date   Anemia    ESRD   Anxiety    Cervical dysplasia    CKD (chronic kidney disease), stage IV (HCC)    Depression    Exstrophy of bladder    GERD (gastroesophageal reflux disease)    History of kidney stones    Hyperlipidemia    Hypertension    Kidney stone    Pneumonia due to COVID-19 virus    PONV (postoperative nausea and vomiting)    Recurrent UTI    Seasonal allergies    Sepsis (HCC)     SURGICAL HISTORY: Past Surgical History:  Procedure Laterality Date   ABDOMINAL HERNIA REPAIR N/A 07/12/2017   ABDOMINAL HYSTERECTOMY N/A 12/23/2015   Boyce-Vest procedure     age 62   CERVICAL CONE BIOPSY  05/26/2013   COLOSTOMY  N/A    COLPOSCOPY  07/19/2015   DIAGNOSTIC LAPAROSCOPY  09/18/2017   abdominal wall mesh excision   DIALYSIS/PERMA CATHETER INSERTION     INSERTION OF ARTERIOVENOUS (AV) ARTEGRAFT ARM Left 05/31/2023   Procedure: INSERTION, GRAFT, ARTERIOVENOUS, UPPER EXTREMITY;  Surgeon: Jama Cordella MATSU, MD;  Location: [facility] ORS;  Service: Vascular;  Laterality: Left;  BRACHIAL AXILLARY   KIDNEY STONE SURGERY     NEPHROSTOMY TUBE PLACEMENT ([facility] HX) N/A    SMALL INTESTINE SURGERY     x3   URETEROSIGMOIDOSTOMY      SOCIAL HISTORY: Social History   Socioeconomic History   Marital status: Single    Spouse name: Not on file   Number of children: Not on file   Years of education: Not on file   Highest education level: Not on file  Occupational History   Not on file  Tobacco Use   Smoking status: Never   Smokeless tobacco: Never  Vaping Use   Vaping status: Never Used  Substance and Sexual Activity   Alcohol use: Never   Drug use: Never   Sexual activity: Not on file  Other Topics Concern   Not on file  Social History Narrative   Not on file   Social Drivers of Health   Financial Resource Strain: Not on file  Food Insecurity: Low Risk  (02/11/2023)   Received from [facility]   Hunger Vital Sign    Within the past 12 months, you worried that your food would run out before you got money to buy more: Never true    Within the past 12 months, the food you bought just didn't last and you didn't have money to get more. : Never true  Transportation Needs: No Transportation Needs (02/11/2023)   Received from [facility]    In the past 12 months, has lack of reliable transportation kept you from medical appointments, meetings, work or from getting things needed for daily living? : No  Physical Activity: Not on file  Stress: Not on file  Social Connections: Not on file  Intimate Partner Violence: Not on file    FAMILY HISTORY: Family History  Problem Relation Age of Onset    Lung cancer Mother    Stroke Father    Breast cancer Neg Hx     ALLERGIES:  is allergic to amlodipine, cephalosporins, codeine, hydrochlorothiazide, hydromorphone, latex, losartan potassium, metoprolol, doxycycline, egg-derived products, lisinopril, sucralfate, cholecalciferol, ciprofloxacin, clonidine hcl, diltiazem, doxazosin mesylate, hydralazine , hydralazine  hcl, iodine-131, ivp dye [iodinated contrast media], nifedipine, ranitidine hcl, sulfa antibiotics, cefuroxime axetil, diltiazem hcl, isosorbide, morphine, penicillin g, and prazosin.  MEDICATIONS:  Current Outpatient Medications  Medication  Sig Dispense Refill   acetaminophen  (TYLENOL ) 325 MG tablet Take 650 mg by mouth every 6 (six) hours as needed for moderate pain (pain score 4-6).     amLODipine (NORVASC) 10 MG tablet Take 10 mg by mouth daily.     busPIRone (BUSPAR) 5 MG tablet Take 5 mg by mouth 2 (two) times daily.     carvedilol (COREG) 12.5 MG tablet Take 12.5 mg by mouth 2 (two) times daily.     cetirizine (ZYRTEC) 10 MG tablet Take 10 mg by mouth daily as needed.     cyclobenzaprine  (FLEXERIL ) 10 MG tablet Take 10 mg by mouth at bedtime as needed for muscle spasms.     Epoetin  Alfa-epbx (RETACRIT  IJ) Inject as directed. Monthly injection at cancer center     fluticasone (FLONASE) 50 MCG/ACT nasal spray Place 2 sprays into both nostrils daily as needed for allergies or rhinitis.     furosemide (LASIX) 40 MG tablet Take 40 mg by mouth See admin instructions. Take 40 mg by mouth on Sunday, Monday, Wednesday, Thursday and Friday     hydrOXYzine (ATARAX) 25 MG tablet Take 25 mg by mouth every 6 (six) hours as needed for anxiety.     montelukast (SINGULAIR) 10 MG tablet Take 10 mg by mouth daily as needed (allergies).     oxyCODONE -acetaminophen  (PERCOCET) 5-325 MG tablet Take 1 tablet by mouth every 6 (six) hours as needed for severe pain (pain score 7-10) or moderate pain (pain score 4-6). 30 tablet 0   sodium bicarbonate  650  MG tablet Take 650 mg by mouth See admin instructions. Take 650 mg by mouth 3 times daily on Sunday, Monday, Wednesday, Thursday and Friday.     No current facility-administered medications for this visit.    Review of Systems  Constitutional:  Positive for fatigue. Negative for chills and fever.  HENT:   Negative for hearing loss and voice change.   Eyes:  Negative for eye problems.  Respiratory:  Negative for chest tightness and cough.   Cardiovascular:  Negative for chest pain.  Gastrointestinal:  Negative for abdominal distention, abdominal pain and blood in stool.  Endocrine: Negative for hot flashes.  Genitourinary:  Negative for difficulty urinating and frequency.   Musculoskeletal:  Negative for arthralgias.  Skin:  Negative for itching and rash.  Neurological:  Negative for extremity weakness.  Hematological:  Negative for adenopathy.  Psychiatric/Behavioral:  Negative for confusion.    PHYSICAL EXAMINATION: ECOG PERFORMANCE STATUS: 1 - Symptomatic but completely ambulatory Vitals:   09/02/23 1348 09/02/23 1443  BP: (!) 182/92 (!) 186/88  Pulse:    Resp:    Temp:    SpO2:     Filed Weights   09/02/23 1338  Weight: 131 lb 14.4 oz (59.8 kg)    Physical Exam Constitutional:      General: She is not in acute distress. HENT:     Head: Normocephalic and atraumatic.  Eyes:     General: No scleral icterus. Cardiovascular:     Rate and Rhythm: Normal rate and regular rhythm.     Heart sounds: Normal heart sounds.  Pulmonary:     Effort: Pulmonary effort is normal. No respiratory distress.     Breath sounds: No wheezing.  Abdominal:     General: Bowel sounds are normal. There is no distension.     Palpations: Abdomen is soft.  Musculoskeletal:        General: No deformity. Normal range of motion.     Cervical  back: Normal range of motion and neck supple.  Skin:    General: Skin is warm and dry.     Findings: No erythema or rash.  Neurological:     Mental  Status: She is alert and oriented to person, place, and time. Mental status is at baseline.  Psychiatric:        Mood and Affect: Mood normal.     LABORATORY DATA:  I have reviewed the data as listed    Latest Ref Rng & Units 09/02/2023    1:22 PM 08/05/2023    1:10 PM 07/08/2023    1:08 PM  CBC  WBC 4.0 - 10.5 K/uL 7.9     Hemoglobin 12.0 - 15.0 g/dL 7.4  7.7  7.9   Hematocrit 36.0 - 46.0 % 22.7  23.6  24.6   Platelets 150 - 400 K/uL 220         Latest Ref Rng & Units 05/31/2023    7:08 AM 05/24/2023   11:05 AM 09/22/2019    6:31 AM  CMP  Glucose 70 - 99 mg/dL 93  83  887   BUN 8 - 23 mg/dL 62  75  49   Creatinine 0.44 - 1.00 mg/dL 1.19  2.08  7.85   Sodium 135 - 145 mmol/L 137  136  138   Potassium 3.5 - 5.1 mmol/L 4.9  5.6  4.1   Chloride 98 - 111 mmol/L 111  106  113   CO2 22 - 32 mmol/L  19  17   Calcium 8.9 - 10.3 mg/dL  7.9  8.4   Total Protein 6.5 - 8.1 g/dL   6.4   Total Bilirubin 0.3 - 1.2 mg/dL   0.5   Alkaline Phos 38 - 126 U/L   80   AST 15 - 41 U/L   13   ALT 0 - 44 U/L   10       RADIOGRAPHIC STUDIES: I have personally reviewed the radiological images as listed and agreed with the findings in the report. VAS US  DUPLEX DIALYSIS ACCESS (AVF, AVG) Result Date: 06/24/2023 DIALYSIS ACCESS Patient Name:  Jessica Patterson  Date of Exam:   06/21/2023 Medical Rec #: [id number]       Accession #:    [id number] Date of Birth: 09/04/1960       Patient Gender: F Patient Age:   89 years Exam Location:  [facility] Procedure:      VAS US  DUPLEX DIALYSIS ACCESS (AVF, AVG) Referring Phys: CORDELLA SHAWL --------------------------------------------------------------------------------  Access Site: Left Upper Extremity. Access Type: Brachial Axillary AVG. History: 05/31/2023: Left Brachial Axillary AVG Created. Performing Technologist: Leafy Gibes RVS  Examination Guidelines: A complete evaluation includes B-mode imaging, spectral Doppler, color Doppler, and power  Doppler as needed of all accessible portions of each vessel. Unilateral testing is considered an integral part of a complete examination. Limited examinations for reoccurring indications may be performed as noted.  Findings:   +--------------------+----------+-----------------+--------+ AVG                 PSV (cm/s)Flow Vol (mL/min)Describe +--------------------+----------+-----------------+--------+ Native artery inflow   431          1299                +--------------------+----------+-----------------+--------+ Arterial anastomosis   899                              +--------------------+----------+-----------------+--------+  Prox graft             488                              +--------------------+----------+-----------------+--------+ Mid graft              207                              +--------------------+----------+-----------------+--------+ Distal graft           342                              +--------------------+----------+-----------------+--------+ Venous anastomosis     210                              +--------------------+----------+-----------------+--------+ Venous outflow         125                              +--------------------+----------+-----------------+--------+ +--------------+-------------+---------+---------+---------+-------------------+               Diameter (cm)  Depth  Branching   PSV       Flow Volume                                  (cm)             (cm/s)       (ml/min)       +--------------+-------------+---------+---------+---------+-------------------+ Lt Rad Art                                      99                        Dist                                                                      +--------------+-------------+---------+---------+---------+-------------------+  Summary: Initial Imaging Post Lt Brachial Axillary AVG; The Lt Upper Arm AVG appears to be patent throughout. Flow  Volume appears to be Normal.  *See table(s) above for measurements and observations.  Diagnosing physician: Cordella Shawl MD Electronically signed by Cordella Shawl MD on 06/24/2023 at 8:42:55 AM.   --------------------------------------------------------------------------------   Final

## 2023-09-02 NOTE — Assessment & Plan Note (Signed)
 Recommend patient to monitor BP closely follow-up with primary care provider and nephrology.

## 2023-09-02 NOTE — Progress Notes (Signed)
 OK to proceed with retacrit , per Dr. Babara. [phone]

## 2023-09-03 IMAGING — MG MM DIGITAL SCREENING BILAT W/ TOMO AND CAD
6 of 10 series · 6 of 30 positions shown · non-contrast
Comparison: Previous exam(s).

CLINICAL DATA: Screening.

EXAM:
DIGITAL SCREENING BILATERAL MAMMOGRAM WITH TOMOSYNTHESIS AND CAD
TECHNIQUE: Bilateral screening digital craniocaudal and mediolateral oblique
mammograms were obtained. Bilateral screening digital breast
tomosynthesis was performed. The images were evaluated with
computer-aided detection.

[R MLO synth-2D (1 of 2)]
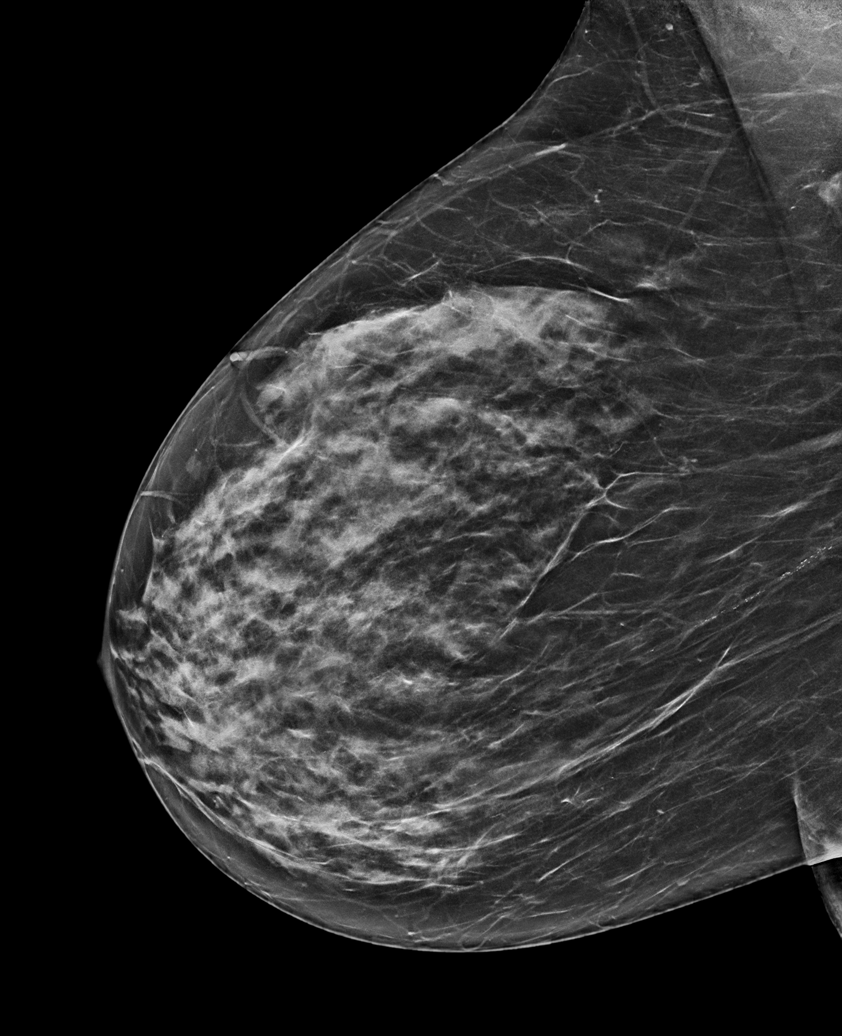

[R CC synth-2D]
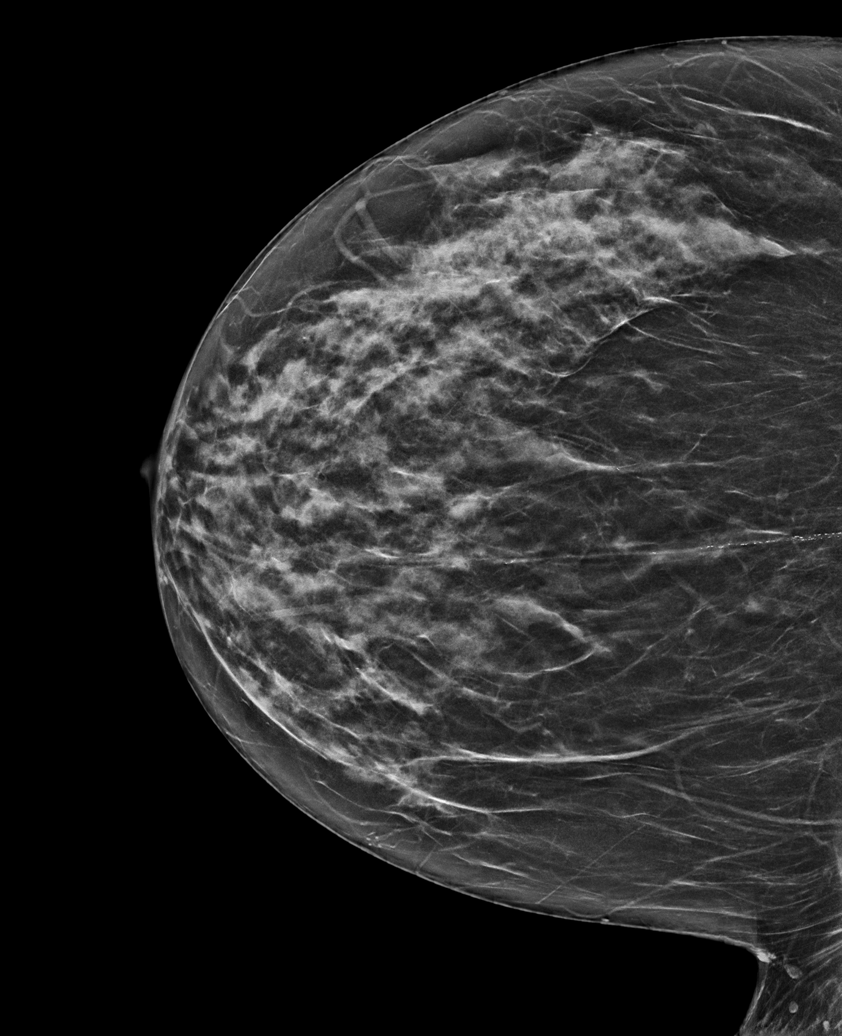

[L CC synth-2D]
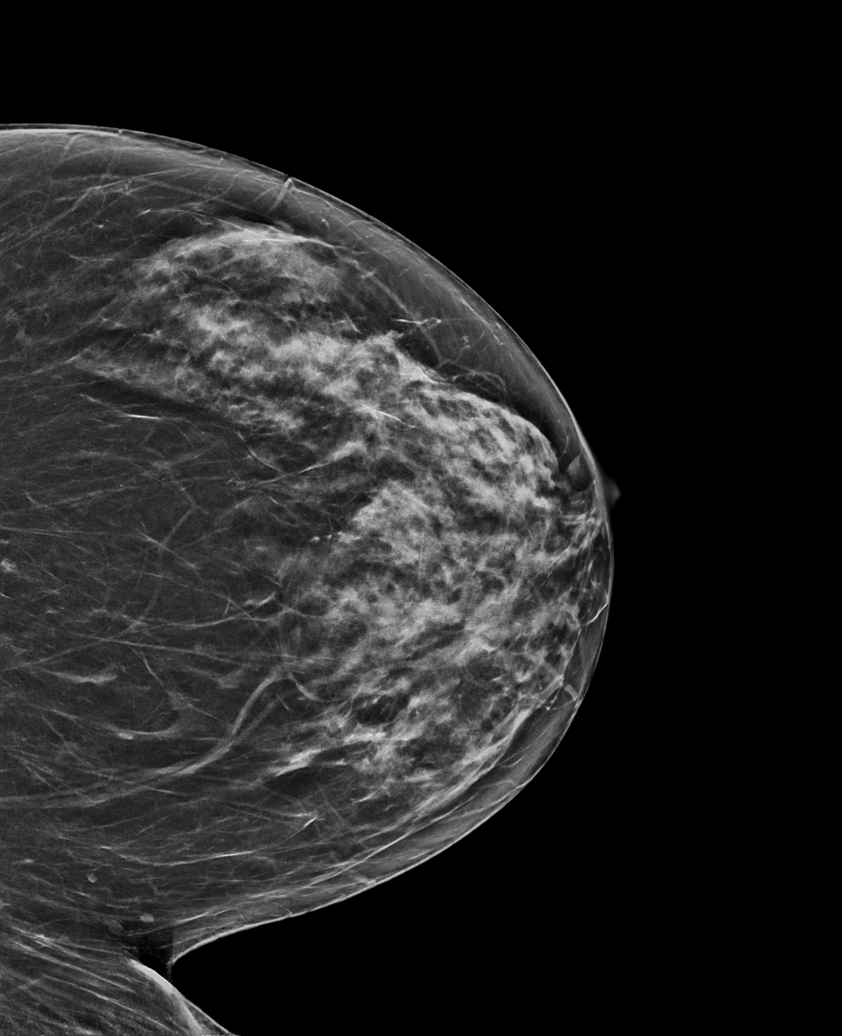

[L MLO synth-2D]
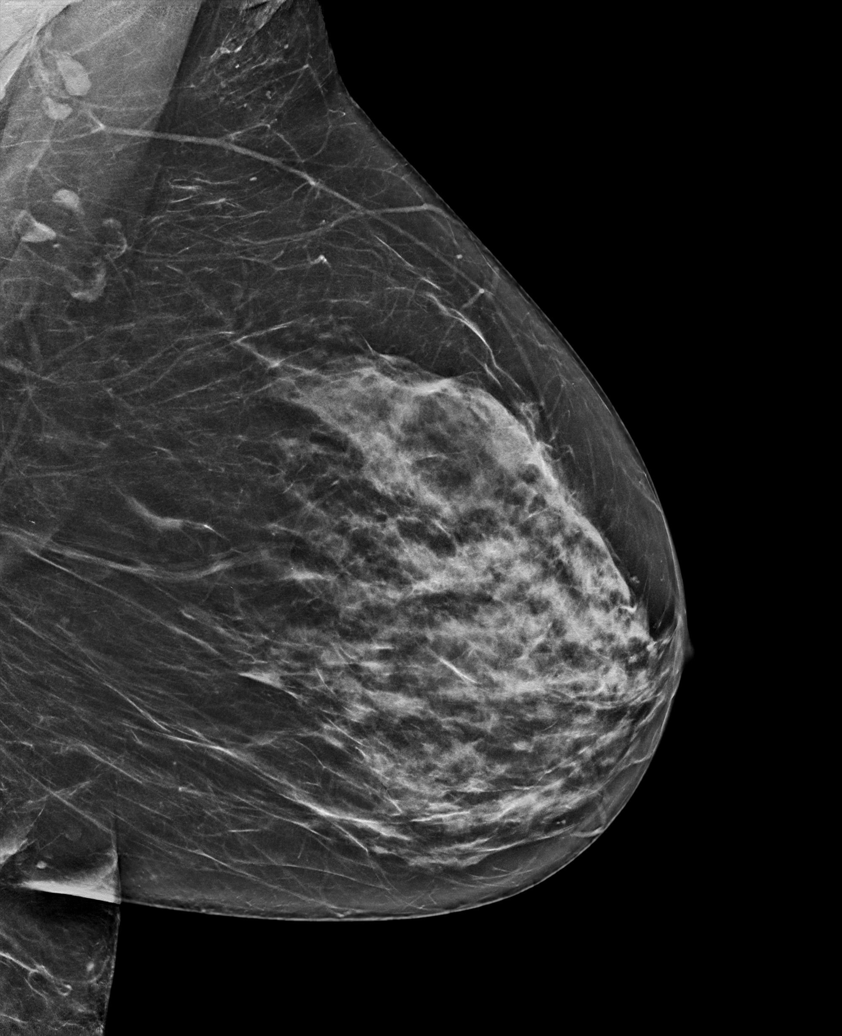

[R MLO synth-2D (2 of 2)]
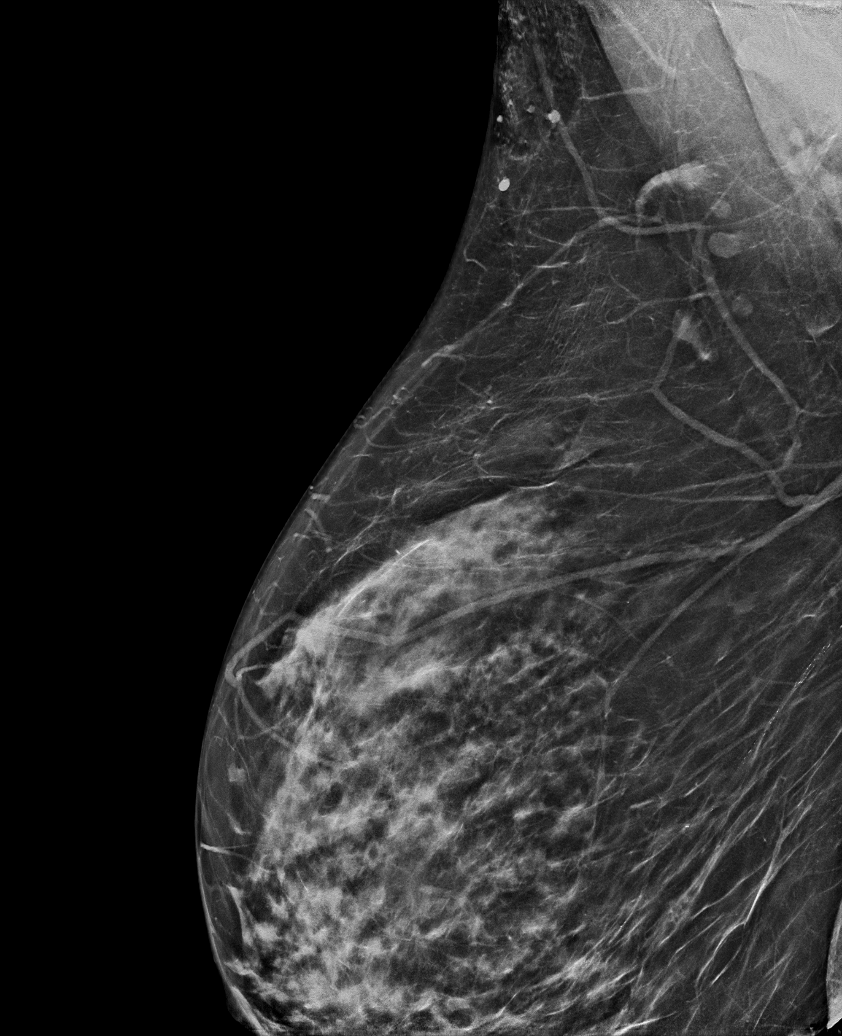

[R MLO tomo · tomo slice 40/79.0]
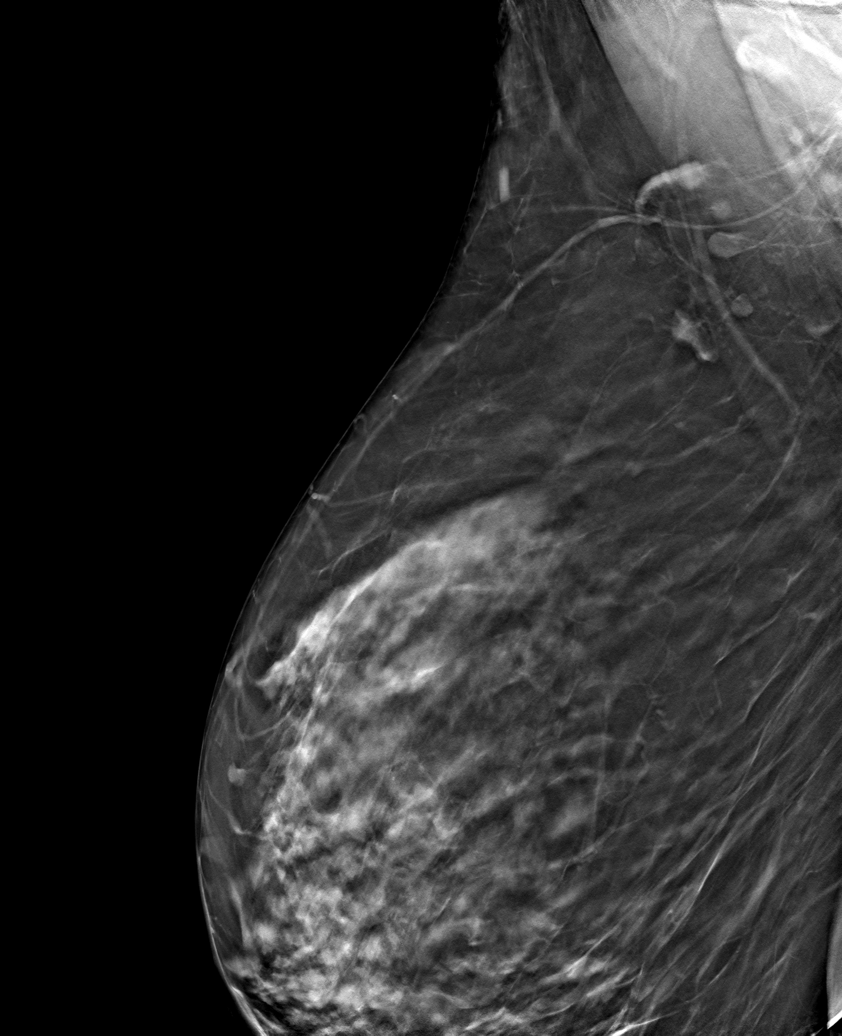

[6 of 30 positions shown; findings below may reference images not displayed]

ACR Breast Density Category c: The breast tissue is heterogeneously
dense, which may obscure small masses.
FINDINGS: There are no findings suspicious for malignancy.
IMPRESSION: No mammographic evidence of malignancy. A result letter of this
screening mammogram will be mailed directly to the patient.

RECOMMENDATION:
Screening mammogram in one year. (Code:Q3-W-BC3)

BI-RADS CATEGORY  1: Negative.

## 2023-09-05 ENCOUNTER — Encounter
Admission: RE | Disposition: A | Discharge status: Home / Self Care | Payer: Self-pay | Source: Home / Self Care | Attending: Vascular Surgery

## 2023-09-05 ENCOUNTER — Ambulatory Visit
Admission: RE | Admit: 2023-09-05 | Discharge: 2023-09-05 | Disposition: A | Discharge status: Home / Self Care | Attending: Vascular Surgery | Admitting: Vascular Surgery

## 2023-09-05 ENCOUNTER — Encounter: Payer: Self-pay | Admitting: Vascular Surgery

## 2023-09-05 DIAGNOSIS — N186 End stage renal disease: Secondary | ICD-10-CM

## 2023-09-05 DIAGNOSIS — Z4901 Encounter for fitting and adjustment of extracorporeal dialysis catheter: Secondary | ICD-10-CM | POA: Insufficient documentation

## 2023-09-05 DIAGNOSIS — I120 Hypertensive chronic kidney disease with stage 5 chronic kidney disease or end stage renal disease: Secondary | ICD-10-CM | POA: Diagnosis not present

## 2023-09-05 DIAGNOSIS — Z452 Encounter for adjustment and management of vascular access device: Secondary | ICD-10-CM

## 2023-09-05 DIAGNOSIS — Z992 Dependence on renal dialysis: Secondary | ICD-10-CM | POA: Diagnosis not present

## 2023-09-05 HISTORY — PX: DIALYSIS/PERMA CATHETER REMOVAL: CATH118289

## 2023-09-05 SURGERY — DIALYSIS/PERMA CATHETER REMOVAL
Anesthesia: LOCAL

## 2023-09-05 SURGICAL SUPPLY — 1 items: TRAY LACERATION DISP STRL (SET/KITS/TRAYS/PACK) IMPLANT

## 2023-09-05 NOTE — Discharge Instructions (Signed)
 Tunneled Catheter Removal, Care After Refer to this sheet in the next few weeks. These instructions provide you with information about caring for yourself after your procedure. Your health care provider may also give you more specific instructions. Your treatment has been planned according to current medical practices, but problems sometimes occur. Call your health care provider if you have any problems or questions after your procedure. What can I expect after the procedure? After the procedure, it is common to have: Some mild redness, swelling, and pain around your catheter site.   Follow these instructions at home: Incision care  Check your removal site  every day for signs of infection. Check for: More redness, swelling, or pain. More fluid or blood. Warmth. Pus or a bad smell. Remove your dressing in 48hrs leave open to air  Activity  Return to your normal activities as told by your health care provider. Ask your health care provider what activities are safe for you. Do not lift anything that is heavier than 10 lb (4.5 kg) for 3 days  You may shower tomorrow  Contact a health care provider if: You have more fluid or blood coming from your removal site You have more redness, swelling, or pain at your incisions or around the area where your catheter was removed Your removal site feel warm to the touch. You feel unusually weak. You feel nauseous.. Get help right away if You have swelling in your arm, shoulder, neck, or face. You develop chest pain. You have difficulty breathing. You feel dizzy or light-headed. You have pus or a bad smell coming from your removal site You have a fever. You develop bleeding from your removal site, and your bleeding does not stop. This information is not intended to replace advice given to you by your health care provider. Make sure you discuss any questions you have with your health care provider. Document Released: 01/09/2012 Document Revised:  09/25/2015 Document Reviewed: 10/18/2014 Elsevier Interactive Patient Education  2017 [employer].

## 2023-09-05 NOTE — Op Note (Signed)
 Operative Note     Preoperative diagnosis:   1. ESRD with functional permanent access  Postoperative diagnosis:  1. ESRD with functional permanent access  Procedure:  Removal of right jugular Permcath  Surgeon:  Selinda Gu, MD  Anesthesia:  Local  EBL:  Minimal  Indication for the Procedure:  The patient has a functional permanent dialysis access and no longer needs their permcath.  This can be removed.  Risks and benefits are discussed and informed consent is obtained.  Description of the Procedure:  The patient's right neck, chest and existing catheter were sterilely prepped and draped. The area around the catheter was anesthetized copiously with 1% lidocaine . The catheter was dissected out with curved hemostats until the cuff was freed from the surrounding fibrous sheath. The fiber sheath was transected, and the catheter was then removed in its entirety using gentle traction. Pressure was held and sterile dressings were placed. The patient tolerated the procedure well and was taken to the recovery room in stable condition.     Selinda Gu  09/05/2023, 4:50 PM This note was created with Dragon Medical transcription system. Any errors in dictation are purely unintentional.

## 2023-09-05 NOTE — H&P (Signed)
 [facility] Admission History & Physical  MRN : [id number]  Jessica Patterson is a 63 y.o. (Sep 22, 1960) female who presents with chief complaint of No chief complaint on file. [name]  History of Present Illness: I am asked to evaluate the patient by the dialysis center. The patient was sent here because they have a nonfunctioning tunneled catheter and a functioning left arm AVG.  The patient reports they're not been any problems with any of their dialysis runs. They are reporting good flows with good parameters at dialysis.   Patient denies pain or tenderness overlying the access.  There is no pain with dialysis.  The patient denies hand pain or finger pain consistent with steal syndrome.  No fevers or chills while on dialysis.    No current facility-administered medications for this encounter.    Past Medical History:  Diagnosis Date   Anemia    ESRD   Anxiety    Cervical dysplasia    CKD (chronic kidney disease), stage IV (HCC)    Depression    Exstrophy of bladder    GERD (gastroesophageal reflux disease)    History of kidney stones    Hyperlipidemia    Hypertension    Kidney stone    Pneumonia due to COVID-19 virus    PONV (postoperative nausea and vomiting)    Recurrent UTI    Seasonal allergies    Sepsis [facility])     Past Surgical History:  Procedure Laterality Date   ABDOMINAL HERNIA REPAIR N/A 07/12/2017   ABDOMINAL HYSTERECTOMY N/A 12/23/2015   Boyce-Vest procedure     age 10   CERVICAL CONE BIOPSY  05/26/2013   COLOSTOMY N/A    COLPOSCOPY  07/19/2015   DIAGNOSTIC LAPAROSCOPY  09/18/2017   abdominal wall mesh excision   DIALYSIS/PERMA CATHETER INSERTION     INSERTION OF ARTERIOVENOUS (AV) ARTEGRAFT ARM Left 05/31/2023   Procedure: INSERTION, GRAFT, ARTERIOVENOUS, UPPER EXTREMITY;  Surgeon: Jama Cordella MATSU, MD;  Location: [facility] ORS;  Service: Vascular;  Laterality: Left;  BRACHIAL AXILLARY   KIDNEY STONE SURGERY     NEPHROSTOMY TUBE PLACEMENT  ([facility] HX) N/A    SMALL INTESTINE SURGERY     x3   URETEROSIGMOIDOSTOMY      Social History   Tobacco Use   Smoking status: Never   Smokeless tobacco: Never  Vaping Use   Vaping status: Never Used  Substance Use Topics   Alcohol use: Never   Drug use: Never     Family History  Problem Relation Age of Onset   Lung cancer Mother    Stroke Father    Breast cancer Neg Hx     No family history of bleeding or clotting disorders, autoimmune disease or porphyria  Allergies  Allergen Reactions   Amlodipine Shortness Of Breath and Other (See Comments)   Cephalosporins Shortness Of Breath and Nausea And Vomiting    Has tolerated 2nd (CEFOXITIN) and 3rd (CEFPODOXIME) generation cephalosporins in the past without documented ADRs.    Codeine Shortness Of Breath and Nausea And Vomiting   Hydrochlorothiazide Shortness Of Breath and Other (See Comments)   Hydromorphone Hives, Nausea And Vomiting and Other (See Comments)    Other Reaction(s): GI Intolerance   Latex Itching and Rash    Other reaction(s): Itching  Other reaction(s): Itching    Other reaction(s): Itching Other reaction(s): Itching   Losartan Potassium Shortness Of Breath and Other (See Comments)    Other reaction(s): Shortness Of Breath Other reaction(s): Shortness Of  Breath    Metoprolol Other (See Comments)   Doxycycline Hives and Rash    Other reaction(s): Hives Other reaction(s): Hives    Egg-Derived Products Nausea And Vomiting   Lisinopril     Other Reaction(s): cough (is bothersome, but cannot tolerate any other meds - see rest of reactions)   Sucralfate Rash   Cholecalciferol Other (See Comments)   Ciprofloxacin Other (See Comments)   Clonidine Hcl Nausea And Vomiting   Diltiazem    Doxazosin Mesylate Other (See Comments)   Hydralazine     Hydralazine  Hcl Other (See Comments)   Iodine-131 Other (See Comments)    Can not receive due to Kidney disease Can not receive due to Kidney disease    Ivp Dye  [Iodinated Contrast Media]    Methyldopa Nausea Only    Blisters    Nifedipine     Other Reaction(s): Other (See Comments)  CHEST PAIN ACHING   Ranitidine Hcl Other (See Comments)   Sulfa Antibiotics    Cefuroxime Axetil Nausea And Vomiting   Diltiazem Hcl Nausea Only    Other Reaction(s): GI Intolerance   Isosorbide Nausea And Vomiting and Other (See Comments)    Other Reaction(s): GI Intolerance   Morphine Hives, Nausea And Vomiting and Other (See Comments)    Other Reaction(s): GI Intolerance   Penicillin G Rash    welts    Prazosin Other (See Comments)    Headaches     REVIEW OF SYSTEMS (Negative unless checked)  Constitutional: [] Weight loss  [] Fever  [] Chills Cardiac: [] Chest pain   [] Chest pressure   [] Palpitations   [] Shortness of breath when laying flat   [] Shortness of breath at rest   [x] Shortness of breath with exertion. Vascular:  [] Pain in legs with walking   [] Pain in legs at rest   [] Pain in legs when laying flat   [] Claudication   [] Pain in feet when walking  [] Pain in feet at rest  [] Pain in feet when laying flat   [] History of DVT   [] Phlebitis   [] Swelling in legs   [] Varicose veins   [] Non-healing ulcers Pulmonary:   [] Uses home oxygen   [] Productive cough   [] Hemoptysis   [] Wheeze  [] COPD   [] Asthma Neurologic:  [] Dizziness  [] Blackouts   [] Seizures   [] History of stroke   [] History of TIA  [] Aphasia   [] Temporary blindness   [] Dysphagia   [] Weakness or numbness in arms   [] Weakness or numbness in legs Musculoskeletal:  [] Arthritis   [] Joint swelling   [] Joint pain   [] Low back pain Hematologic:  [] Easy bruising  [] Easy bleeding   [] Hypercoagulable state   [x] Anemic  [] Hepatitis Gastrointestinal:  [] Blood in stool   [] Vomiting blood  [x] Gastroesophageal reflux/heartburn   [] Difficulty swallowing. Genitourinary:  [x] Chronic kidney disease   [] Difficult urination  [] Frequent urination  [] Burning with urination   [] Blood in urine Skin:  [] Rashes   [] Ulcers    [] Wounds Psychological:  [x] History of anxiety   []  History of major depression.  Physical Examination  There were no vitals filed for this visit. There is no height or weight on file to calculate BMI. Gen: WD/WN, NAD Head: [place]/AT, No temporalis wasting. Prominent temp pulse not noted. Ear/Nose/Throat: Hearing grossly intact, nares w/o erythema or drainage, oropharynx w/o Erythema/Exudate,  Eyes: Conjunctiva clear, sclera non-icteric Neck: Trachea midline.  No JVD.  Pulmonary:  Good air movement, respirations not labored, no use of accessory muscles.  Cardiac: RRR, normal S1, S2. Vascular: left arm AVG with thrill  Vessel Right Left  Radial Palpable Palpable   Musculoskeletal: M/S 5/5 throughout.  Extremities without ischemic changes.  No deformity or atrophy.  Neurologic: Sensation grossly intact in extremities.  Symmetrical.  Speech is fluent. Motor exam as listed above. Psychiatric: Judgment intact, Mood & affect appropriate for pt's clinical situation. Dermatologic: No rashes or ulcers noted.  No cellulitis or open wounds.    CBC Lab Results  Component Value Date   WBC 7.9 09/02/2023   HGB 7.4 (L) 09/02/2023   HCT 22.7 (L) 09/02/2023   MCV 95.0 09/02/2023   PLT 220 09/02/2023    BMET    Component Value Date/Time   NA 137 05/31/2023 0708   K 4.9 05/31/2023 0708   CL 111 05/31/2023 0708   CO2 19 (L) 05/24/2023 1105   GLUCOSE 93 05/31/2023 0708   BUN 62 (H) 05/31/2023 0708   CREATININE 8.80 (H) 05/31/2023 0708   CALCIUM 7.9 (L) 05/24/2023 1105   GFRNONAA 5 (L) 05/24/2023 1105   GFRAA 28 (L) 09/22/2019 0631   CrCl cannot be calculated (Patient's most recent lab result is older than the maximum 21 days allowed.).  COAG No results found for: INR, PROTIME  Radiology No results found.  Assessment/Plan 1.  Complication dialysis device with non-functional AV access:  Patient's Tunneled catheter is not being used. The patient has an extremity access that is  functioning well. Therefore, the patient will undergo removal of the tunneled catheter under local anesthesia.  The risks and benefits were described to the patient.  All questions were answered.  The patient agrees to proceed with angiography and intervention. Potassium will be drawn to ensure that it is an appropriate level prior to performing intervention. 2.  End-stage renal disease requiring hemodialysis:  Patient will continue dialysis therapy without further interruption  3.  Hypertension:  Patient will continue medical management; nephrology is following no changes in oral medications.     Selinda Gu, MD  09/05/2023 2:47 PM

## 2023-09-06 ENCOUNTER — Encounter: Payer: Self-pay | Admitting: Vascular Surgery

## 2023-09-06 MED ORDER — LIDOCAINE-EPINEPHRINE (PF) 1 %-1:200000 IJ SOLN
INTRAMUSCULAR | Status: AC | PRN
Start: 2023-09-05 — End: ?
  Administered 2023-09-05: 20 mL

## 2023-09-16 ENCOUNTER — Inpatient Hospital Stay

## 2023-09-16 ENCOUNTER — Inpatient Hospital Stay: Attending: Oncology

## 2023-09-16 VITALS — BP 166/79 | HR 84 | Temp 97.2°F

## 2023-09-16 DIAGNOSIS — N186 End stage renal disease: Secondary | ICD-10-CM | POA: Insufficient documentation

## 2023-09-16 DIAGNOSIS — I120 Hypertensive chronic kidney disease with stage 5 chronic kidney disease or end stage renal disease: Secondary | ICD-10-CM | POA: Insufficient documentation

## 2023-09-16 DIAGNOSIS — D631 Anemia in chronic kidney disease: Secondary | ICD-10-CM | POA: Diagnosis present

## 2023-09-16 LAB — HEMOGLOBIN AND HEMATOCRIT (CANCER CENTER ONLY)
HCT: 24.1 % — ABNORMAL LOW (ref 36.0–46.0)
Hemoglobin: 7.7 g/dL — ABNORMAL LOW (ref 12.0–15.0)

## 2023-09-16 MED ORDER — EPOETIN ALFA-EPBX 40000 UNIT/ML IJ SOLN
40000.0000 [IU] | Freq: Once | INTRAMUSCULAR | Status: AC
  Administered 2023-09-16 (×2): 40000 [IU] via SUBCUTANEOUS
  Filled 2023-09-16: qty 1

## 2023-09-16 MED ORDER — DIPHENHYDRAMINE HCL 25 MG PO CAPS: 25.0000 mg | ORAL_CAPSULE | ORAL | Status: DC | PRN

## 2023-09-17 ENCOUNTER — Ambulatory Visit

## 2023-09-17 ENCOUNTER — Other Ambulatory Visit

## 2023-09-19 ENCOUNTER — Other Ambulatory Visit (INDEPENDENT_AMBULATORY_CARE_PROVIDER_SITE_OTHER): Payer: Self-pay | Admitting: Nurse Practitioner

## 2023-09-19 DIAGNOSIS — N186 End stage renal disease: Secondary | ICD-10-CM

## 2023-09-20 ENCOUNTER — Ambulatory Visit (INDEPENDENT_AMBULATORY_CARE_PROVIDER_SITE_OTHER): Admitting: Nurse Practitioner

## 2023-09-20 ENCOUNTER — Encounter (INDEPENDENT_AMBULATORY_CARE_PROVIDER_SITE_OTHER): Payer: Self-pay | Admitting: Nurse Practitioner

## 2023-09-20 ENCOUNTER — Ambulatory Visit (INDEPENDENT_AMBULATORY_CARE_PROVIDER_SITE_OTHER)

## 2023-09-20 VITALS — BP 174/85 | HR 89 | Resp 16 | Wt 132.2 lb

## 2023-09-20 DIAGNOSIS — N186 End stage renal disease: Secondary | ICD-10-CM

## 2023-09-20 DIAGNOSIS — I10 Essential (primary) hypertension: Secondary | ICD-10-CM

## 2023-09-23 ENCOUNTER — Encounter (INDEPENDENT_AMBULATORY_CARE_PROVIDER_SITE_OTHER): Payer: Self-pay | Admitting: Nurse Practitioner

## 2023-09-23 NOTE — Progress Notes (Signed)
 Subjective:    Patient ID: Jessica Patterson, female    DOB: 1960/12/09, 63 y.o.   MRN: [id number] Chief Complaint  Patient presents with   Follow-up    3 month HDA follow up    The patient returns to the office for followup of their dialysis access.   The patient reports the function of the access has been stable. Patient denies difficulty with cannulation. The patient denies increased bleeding time after removing the needles. The patient denies hand pain or other symptoms consistent with steal phenomena.  No significant arm swelling.  The patient denies any complaints from the dialysis center or their nephrologist.  She had previous infiltration but that has resolved at this time.  The patient denies redness or swelling at the access site. The patient denies fever or chills at home or while on dialysis.  No recent shortening of the patient's walking distance or new symptoms consistent with claudication.  No history of rest pain symptoms. No new ulcers or wounds of the lower extremities have occurred.  The patient denies amaurosis fugax or recent TIA symptoms. There are no recent neurological changes noted. There is no history of DVT, PE or superficial thrombophlebitis. No recent episodes of angina or shortness of breath documented.   Duplex ultrasound of the AV access shows a patent access.  The previously noted stenosis is not significantly changed compared to last study.  Flow volume today is 2237 cc/min (previous flow volume was 1299 cc/min)      Review of Systems  Hematological:  Does not bruise/bleed easily.  All other systems reviewed and are negative.      Objective:   Physical Exam Vitals reviewed.  HENT:     Head: Normocephalic.  Cardiovascular:     Rate and Rhythm: Normal rate.     Pulses:          Radial pulses are 2+ on the left side.     Arteriovenous access: Left arteriovenous access is present.     Comments: Good thrill and bruit Pulmonary:     Effort:  Pulmonary effort is normal.  Neurological:     Mental Status: She is alert.     BP (!) 174/85   Pulse 89   Resp 16   Wt 132 lb 3.2 oz (60 kg)   BMI 25.82 kg/m   Past Medical History:  Diagnosis Date   Anemia    ESRD   Anxiety    Cervical dysplasia    CKD (chronic kidney disease), stage IV (HCC)    Depression    Exstrophy of bladder    GERD (gastroesophageal reflux disease)    History of kidney stones    Hyperlipidemia    Hypertension    Kidney stone    Pneumonia due to COVID-19 virus    PONV (postoperative nausea and vomiting)    Recurrent UTI    Seasonal allergies    Sepsis (HCC)     Social History   Socioeconomic History   Marital status: Single    Spouse name: Not on file   Number of children: Not on file   Years of education: Not on file   Highest education level: Not on file  Occupational History   Not on file  Tobacco Use   Smoking status: Never   Smokeless tobacco: Never  Vaping Use   Vaping status: Never Used  Substance and Sexual Activity   Alcohol use: Never   Drug use: Never   Sexual activity: Not  on file  Other Topics Concern   Not on file  Social History Narrative   Not on file   Social Drivers of Health   Financial Resource Strain: Not on file  Food Insecurity: Low Risk  (02/11/2023)   Received from [facility]   Hunger Vital Sign    Within the past 12 months, you worried that your food would run out before you got money to buy more: Never true    Within the past 12 months, the food you bought just didn't last and you didn't have money to get more. : Never true  Transportation Needs: No Transportation Needs (02/11/2023)   Received from [facility]    In the past 12 months, has lack of reliable transportation kept you from medical appointments, meetings, work or from getting things needed for daily living? : No  Physical Activity: Not on file  Stress: Not on file  Social Connections: Not on file  Intimate Partner  Violence: Not on file    Past Surgical History:  Procedure Laterality Date   ABDOMINAL HERNIA REPAIR N/A 07/12/2017   ABDOMINAL HYSTERECTOMY N/A 12/23/2015   Boyce-Vest procedure     age 32   CERVICAL CONE BIOPSY  05/26/2013   COLOSTOMY N/A    COLPOSCOPY  07/19/2015   DIAGNOSTIC LAPAROSCOPY  09/18/2017   abdominal wall mesh excision   DIALYSIS/PERMA CATHETER INSERTION     DIALYSIS/PERMA CATHETER REMOVAL N/A 09/05/2023   Procedure: DIALYSIS/PERMA CATHETER REMOVAL;  Surgeon: Marea Selinda RAMAN, MD;  Location: [facility] INVASIVE CV LAB;  Service: Cardiovascular;  Laterality: N/A;   INSERTION OF ARTERIOVENOUS (AV) ARTEGRAFT ARM Left 05/31/2023   Procedure: INSERTION, GRAFT, ARTERIOVENOUS, UPPER EXTREMITY;  Surgeon: Jama Cordella MATSU, MD;  Location: [facility] ORS;  Service: Vascular;  Laterality: Left;  BRACHIAL AXILLARY   KIDNEY STONE SURGERY     NEPHROSTOMY TUBE PLACEMENT ([facility] HX) N/A    SMALL INTESTINE SURGERY     x3   URETEROSIGMOIDOSTOMY      Family History  Problem Relation Age of Onset   Lung cancer Mother    Stroke Father    Breast cancer Neg Hx     Allergies  Allergen Reactions   Amlodipine Shortness Of Breath and Other (See Comments)   Cephalosporins Shortness Of Breath and Nausea And Vomiting    Has tolerated 2nd (CEFOXITIN) and 3rd (CEFPODOXIME) generation cephalosporins in the past without documented ADRs.    Codeine Shortness Of Breath and Nausea And Vomiting   Hydrochlorothiazide Shortness Of Breath and Other (See Comments)   Hydromorphone Hives, Nausea And Vomiting and Other (See Comments)    Other Reaction(s): GI Intolerance   Latex Itching and Rash    Other reaction(s): Itching  Other reaction(s): Itching    Other reaction(s): Itching Other reaction(s): Itching   Losartan Potassium Shortness Of Breath and Other (See Comments)    Other reaction(s): Shortness Of Breath Other reaction(s): Shortness Of Breath    Metoprolol Other (See Comments)   Doxycycline Hives and Rash     Other reaction(s): Hives Other reaction(s): Hives    Egg-Derived Products Nausea And Vomiting   Lisinopril     Other Reaction(s): cough (is bothersome, but cannot tolerate any other meds - see rest of reactions)   Sucralfate Rash   Cholecalciferol Other (See Comments)   Ciprofloxacin Other (See Comments)   Clonidine Hcl Nausea And Vomiting   Diltiazem    Doxazosin Mesylate Other (See Comments)   Hydralazine     Hydralazine  Hcl  Other (See Comments)   Iodine-131 Other (See Comments)    Can not receive due to Kidney disease Can not receive due to Kidney disease    Ivp Dye [Iodinated Contrast Media]    Methyldopa Nausea Only    Blisters    Nifedipine     Other Reaction(s): Other (See Comments)  CHEST PAIN ACHING   Ranitidine Hcl Other (See Comments)   Sulfa Antibiotics    Cefuroxime Axetil Nausea And Vomiting   Diltiazem Hcl Nausea Only    Other Reaction(s): GI Intolerance   Isosorbide Nausea And Vomiting and Other (See Comments)    Other Reaction(s): GI Intolerance   Morphine Hives, Nausea And Vomiting and Other (See Comments)    Other Reaction(s): GI Intolerance   Penicillin G Rash    welts    Prazosin Other (See Comments)    Headaches       Latest Ref Rng & Units 09/16/2023    1:02 PM 09/02/2023    1:22 PM 08/05/2023    1:10 PM  CBC  WBC 4.0 - 10.5 K/uL  7.9    Hemoglobin 12.0 - 15.0 g/dL 7.7  7.4  7.7   Hematocrit 36.0 - 46.0 % 24.1  22.7  23.6   Platelets 150 - 400 K/uL  220        CMP     Component Value Date/Time   NA 137 05/31/2023 0708   K 4.9 05/31/2023 0708   CL 111 05/31/2023 0708   CO2 19 (L) 05/24/2023 1105   GLUCOSE 93 05/31/2023 0708   BUN 62 (H) 05/31/2023 0708   CREATININE 8.80 (H) 05/31/2023 0708   CALCIUM 7.9 (L) 05/24/2023 1105   PROT 6.4 (L) 09/22/2019 0631   ALBUMIN 3.1 (L) 09/22/2019 0631   AST 13 (L) 09/22/2019 0631   ALT 10 09/22/2019 0631   ALKPHOS 80 09/22/2019 0631   BILITOT 0.5 09/22/2019 0631   GFRNONAA 5 (L)  05/24/2023 1105     No results found.     Assessment & Plan:   1. ESRD (end stage renal disease) (HCC) (Primary) Recommend:  The patient is doing well and currently has adequate dialysis access. The patient's dialysis center is not reporting any access issues. Flow pattern is stable when compared to the prior ultrasound.  The patient should have a duplex ultrasound of the dialysis access in 6 months. The patient will follow-up with me in the office after each ultrasound    2. Primary hypertension Continue antihypertensive medications as already ordered, these medications have been reviewed and there are no changes at this time.   Current Outpatient Medications on File Prior to Visit  Medication Sig Dispense Refill   acetaminophen  (TYLENOL ) 325 MG tablet Take 650 mg by mouth every 6 (six) hours as needed for moderate pain (pain score 4-6).     amLODipine (NORVASC) 10 MG tablet Take 10 mg by mouth daily.     busPIRone (BUSPAR) 5 MG tablet Take 5 mg by mouth 2 (two) times daily.     cetirizine (ZYRTEC) 10 MG tablet Take 10 mg by mouth daily as needed.     cyclobenzaprine  (FLEXERIL ) 10 MG tablet Take 10 mg by mouth at bedtime as needed for muscle spasms.     Epoetin  Alfa-epbx (RETACRIT  IJ) Inject as directed. Monthly injection at cancer center     fluticasone (FLONASE) 50 MCG/ACT nasal spray Place 2 sprays into both nostrils daily as needed for allergies or rhinitis.     furosemide (LASIX) 40  MG tablet Take 40 mg by mouth See admin instructions. Take 40 mg by mouth on Sunday, Monday, Wednesday, Thursday and Friday     hydrOXYzine (ATARAX) 25 MG tablet Take 25 mg by mouth every 6 (six) hours as needed for anxiety.     montelukast (SINGULAIR) 10 MG tablet Take 10 mg by mouth daily as needed (allergies).     oxyCODONE -acetaminophen  (PERCOCET) 5-325 MG tablet Take 1 tablet by mouth every 6 (six) hours as needed for severe pain (pain score 7-10) or moderate pain (pain score 4-6). 30 tablet  0   sodium bicarbonate  650 MG tablet Take 650 mg by mouth See admin instructions. Take 650 mg by mouth 3 times daily on Sunday, Monday, Wednesday, Thursday and Friday.     carvedilol (COREG) 12.5 MG tablet Take 12.5 mg by mouth 2 (two) times daily. (Patient not taking: Reported on 09/20/2023)     Current Facility-Administered Medications on File Prior to Visit  Medication Dose Route Frequency Provider Last Rate Last Admin   lidocaine -EPINEPHrine  (PF) (XYLOCAINE -EPINEPHrine ) 1 %-1:200000 (PF) injection    PRN Dew, Jason S, MD   20 mL at 09/05/23 1508    There are no Patient Instructions on file for this visit. No follow-ups on file.   Reginia Battie E Domingue Coltrain, NP

## 2023-09-30 ENCOUNTER — Ambulatory Visit

## 2023-09-30 ENCOUNTER — Inpatient Hospital Stay

## 2023-09-30 VITALS — BP 167/86 | HR 86 | Resp 16

## 2023-09-30 DIAGNOSIS — I120 Hypertensive chronic kidney disease with stage 5 chronic kidney disease or end stage renal disease: Secondary | ICD-10-CM | POA: Diagnosis not present

## 2023-09-30 DIAGNOSIS — D631 Anemia in chronic kidney disease: Secondary | ICD-10-CM

## 2023-09-30 LAB — HEMOGLOBIN AND HEMATOCRIT (CANCER CENTER ONLY)
HCT: 24.8 % — ABNORMAL LOW (ref 36.0–46.0)
Hemoglobin: 7.9 g/dL — ABNORMAL LOW (ref 12.0–15.0)

## 2023-09-30 MED ORDER — EPOETIN ALFA-EPBX 40000 UNIT/ML IJ SOLN
40000.0000 [IU] | Freq: Once | INTRAMUSCULAR | Status: AC
  Administered 2023-09-30: 40000 [IU] via SUBCUTANEOUS
  Filled 2023-09-30: qty 1

## 2023-09-30 MED ORDER — DIPHENHYDRAMINE HCL 25 MG PO CAPS: 25.0000 mg | ORAL_CAPSULE | ORAL | Status: DC | PRN

## 2023-09-30 NOTE — Patient Instructions (Signed)

## 2023-09-30 NOTE — Progress Notes (Signed)
 Pt took benadryl  at home prior to treatment.

## 2023-10-03 ENCOUNTER — Other Ambulatory Visit: Payer: Self-pay | Admitting: Family Medicine

## 2023-10-03 DIAGNOSIS — Z1231 Encounter for screening mammogram for malignant neoplasm of breast: Secondary | ICD-10-CM

## 2023-10-14 ENCOUNTER — Inpatient Hospital Stay: Attending: Oncology

## 2023-10-14 ENCOUNTER — Ambulatory Visit
Admission: RE | Admit: 2023-10-14 | Discharge: 2023-10-14 | Disposition: A | Discharge status: Home / Self Care | Source: Ambulatory Visit | Attending: Internal Medicine | Admitting: Internal Medicine

## 2023-10-14 ENCOUNTER — Other Ambulatory Visit: Payer: Self-pay | Admitting: Internal Medicine

## 2023-10-14 ENCOUNTER — Inpatient Hospital Stay

## 2023-10-14 ENCOUNTER — Ambulatory Visit
Admission: RE | Admit: 2023-10-14 | Discharge: 2023-10-14 | Disposition: A | Discharge status: Home / Self Care | Attending: Internal Medicine | Admitting: Internal Medicine

## 2023-10-14 VITALS — BP 179/88 | HR 89 | Temp 98.2°F | Resp 16

## 2023-10-14 DIAGNOSIS — A159 Respiratory tuberculosis unspecified: Secondary | ICD-10-CM | POA: Diagnosis present

## 2023-10-14 DIAGNOSIS — I120 Hypertensive chronic kidney disease with stage 5 chronic kidney disease or end stage renal disease: Secondary | ICD-10-CM | POA: Diagnosis present

## 2023-10-14 DIAGNOSIS — Z992 Dependence on renal dialysis: Secondary | ICD-10-CM | POA: Insufficient documentation

## 2023-10-14 DIAGNOSIS — D631 Anemia in chronic kidney disease: Secondary | ICD-10-CM

## 2023-10-14 DIAGNOSIS — N186 End stage renal disease: Secondary | ICD-10-CM | POA: Diagnosis present

## 2023-10-14 LAB — HEMOGLOBIN AND HEMATOCRIT (CANCER CENTER ONLY)
HCT: 27.9 % — ABNORMAL LOW (ref 36.0–46.0)
Hemoglobin: 8.9 g/dL — ABNORMAL LOW (ref 12.0–15.0)

## 2023-10-14 MED ORDER — EPOETIN ALFA-EPBX 40000 UNIT/ML IJ SOLN
40000.0000 [IU] | Freq: Once | INTRAMUSCULAR | Status: AC
  Administered 2023-10-14: 40000 [IU] via SUBCUTANEOUS
  Filled 2023-10-14: qty 1

## 2023-10-14 NOTE — Patient Instructions (Signed)

## 2023-10-14 NOTE — Progress Notes (Signed)
 Took benadryl  prior to injection appointment.

## 2023-10-15 ENCOUNTER — Ambulatory Visit

## 2023-10-15 ENCOUNTER — Other Ambulatory Visit

## 2023-10-28 ENCOUNTER — Inpatient Hospital Stay: Admitting: Oncology

## 2023-10-28 ENCOUNTER — Other Ambulatory Visit

## 2023-10-28 ENCOUNTER — Inpatient Hospital Stay

## 2023-10-28 ENCOUNTER — Telehealth: Payer: Self-pay | Admitting: Oncology

## 2023-10-28 ENCOUNTER — Ambulatory Visit

## 2023-10-28 NOTE — Telephone Encounter (Signed)
 Pt called to cancel appts today. She will call back to r/s when she can.

## 2023-10-29 ENCOUNTER — Other Ambulatory Visit

## 2023-10-29 ENCOUNTER — Ambulatory Visit

## 2023-10-29 ENCOUNTER — Ambulatory Visit: Admitting: Oncology

## 2023-11-01 ENCOUNTER — Ambulatory Visit

## 2023-11-01 ENCOUNTER — Other Ambulatory Visit

## 2023-11-01 VITALS — BP 180/91

## 2023-11-01 DIAGNOSIS — I120 Hypertensive chronic kidney disease with stage 5 chronic kidney disease or end stage renal disease: Secondary | ICD-10-CM | POA: Diagnosis not present

## 2023-11-01 DIAGNOSIS — D631 Anemia in chronic kidney disease: Secondary | ICD-10-CM

## 2023-11-01 LAB — CBC (CANCER CENTER ONLY)
HCT: 26.1 % — ABNORMAL LOW (ref 36.0–46.0)
Hemoglobin: 8.6 g/dL — ABNORMAL LOW (ref 12.0–15.0)
MCH: 30.6 pg (ref 26.0–34.0)
MCHC: 33 g/dL (ref 30.0–36.0)
MCV: 92.9 fL (ref 80.0–100.0)
Platelet Count: 218 K/uL (ref 150–400)
RBC: 2.81 MIL/uL — ABNORMAL LOW (ref 3.87–5.11)
RDW: 15 % (ref 11.5–15.5)
WBC Count: 6.3 K/uL (ref 4.0–10.5)
nRBC: 0 % (ref 0.0–0.2)

## 2023-11-01 LAB — RETIC PANEL
Immature Retic Fract: 2.2 % — ABNORMAL LOW (ref 2.3–15.9)
RBC.: 2.84 MIL/uL — ABNORMAL LOW (ref 3.87–5.11)
Retic Count, Absolute: 16.5 K/uL — ABNORMAL LOW (ref 19.0–186.0)
Retic Ct Pct: 0.6 % (ref 0.4–3.1)
Reticulocyte Hemoglobin: 33.9 pg (ref >27.9)

## 2023-11-01 LAB — IRON AND TIBC
Iron: 61 ug/dL (ref 28–170)
Saturation Ratios: 23 % (ref 10.4–31.8)
TIBC: 272 ug/dL (ref 250–450)
UIBC: 211 ug/dL

## 2023-11-01 LAB — FERRITIN: Ferritin: 344 ng/mL — ABNORMAL HIGH (ref 11–307)

## 2023-11-01 MED ORDER — EPOETIN ALFA-EPBX 40000 UNIT/ML IJ SOLN
40000.0000 [IU] | Freq: Once | INTRAMUSCULAR | Status: AC
  Administered 2023-11-01: 40000 [IU] via SUBCUTANEOUS

## 2023-11-01 NOTE — Progress Notes (Signed)
 Patient cancelled her lab/MD/inj/infusion appt that was scheduled for 10/29/23.  On r/s she was only scheduled for lab/inj.  Brought patient back for her injection to be given in Med Onc when pt mentioned that she has never received inj in Med Onc she always gets in infusion suite.  Upon review of the chart patient has in fact been getting her inj in infusion suite due to requiring pre-meds.  Discussed with Dr. Babara and she agrees for pt to receive Retacrit  in Med Onc.  It is documented in the chart that patient no longer requires in office pre meds but takes Benadryl  25 mg prior to coming for inj.    Confirmed with patient that she did take her Benadryl  25 mg prior to appt today, inj given with no issues.  All appts have been r/s appropriately

## 2023-11-08 ENCOUNTER — Inpatient Hospital Stay
Admission: EM | Admit: 2023-11-08 | Discharge: 2023-11-10 | DRG: 640 | Disposition: A | Discharge status: Home / Self Care | Attending: Osteopathic Medicine | Admitting: Osteopathic Medicine

## 2023-11-08 ENCOUNTER — Other Ambulatory Visit: Payer: Self-pay

## 2023-11-08 ENCOUNTER — Emergency Department

## 2023-11-08 DIAGNOSIS — Z91040 Latex allergy status: Secondary | ICD-10-CM

## 2023-11-08 DIAGNOSIS — I132 Hypertensive heart and chronic kidney disease with heart failure and with stage 5 chronic kidney disease, or end stage renal disease: Secondary | ICD-10-CM | POA: Diagnosis present

## 2023-11-08 DIAGNOSIS — D631 Anemia in chronic kidney disease: Secondary | ICD-10-CM | POA: Diagnosis present

## 2023-11-08 DIAGNOSIS — F32A Depression, unspecified: Secondary | ICD-10-CM | POA: Diagnosis present

## 2023-11-08 DIAGNOSIS — I5022 Chronic systolic (congestive) heart failure: Secondary | ICD-10-CM | POA: Diagnosis present

## 2023-11-08 DIAGNOSIS — E8770 Fluid overload, unspecified: Secondary | ICD-10-CM | POA: Diagnosis not present

## 2023-11-08 DIAGNOSIS — N186 End stage renal disease: Secondary | ICD-10-CM | POA: Diagnosis present

## 2023-11-08 DIAGNOSIS — F419 Anxiety disorder, unspecified: Secondary | ICD-10-CM | POA: Diagnosis present

## 2023-11-08 DIAGNOSIS — Z79899 Other long term (current) drug therapy: Secondary | ICD-10-CM

## 2023-11-08 DIAGNOSIS — J810 Acute pulmonary edema: Secondary | ICD-10-CM | POA: Diagnosis not present

## 2023-11-08 DIAGNOSIS — Z882 Allergy status to sulfonamides status: Secondary | ICD-10-CM

## 2023-11-08 DIAGNOSIS — Z823 Family history of stroke: Secondary | ICD-10-CM

## 2023-11-08 DIAGNOSIS — D72829 Elevated white blood cell count, unspecified: Secondary | ICD-10-CM | POA: Diagnosis present

## 2023-11-08 DIAGNOSIS — Z8616 Personal history of COVID-19: Secondary | ICD-10-CM

## 2023-11-08 DIAGNOSIS — J9601 Acute respiratory failure with hypoxia: Principal | ICD-10-CM | POA: Diagnosis present

## 2023-11-08 DIAGNOSIS — I3139 Other pericardial effusion (noninflammatory): Secondary | ICD-10-CM | POA: Diagnosis present

## 2023-11-08 DIAGNOSIS — Z992 Dependence on renal dialysis: Secondary | ICD-10-CM

## 2023-11-08 DIAGNOSIS — Z888 Allergy status to other drugs, medicaments and biological substances status: Secondary | ICD-10-CM

## 2023-11-08 DIAGNOSIS — Z880 Allergy status to penicillin: Secondary | ICD-10-CM

## 2023-11-08 DIAGNOSIS — E785 Hyperlipidemia, unspecified: Secondary | ICD-10-CM | POA: Diagnosis present

## 2023-11-08 DIAGNOSIS — I272 Pulmonary hypertension, unspecified: Secondary | ICD-10-CM | POA: Diagnosis present

## 2023-11-08 DIAGNOSIS — F418 Other specified anxiety disorders: Secondary | ICD-10-CM | POA: Diagnosis present

## 2023-11-08 DIAGNOSIS — Z885 Allergy status to narcotic agent status: Secondary | ICD-10-CM

## 2023-11-08 DIAGNOSIS — E875 Hyperkalemia: Secondary | ICD-10-CM | POA: Diagnosis present

## 2023-11-08 DIAGNOSIS — Z801 Family history of malignant neoplasm of trachea, bronchus and lung: Secondary | ICD-10-CM

## 2023-11-08 DIAGNOSIS — Z881 Allergy status to other antibiotic agents status: Secondary | ICD-10-CM

## 2023-11-08 DIAGNOSIS — I160 Hypertensive urgency: Secondary | ICD-10-CM | POA: Diagnosis present

## 2023-11-08 DIAGNOSIS — Z91041 Radiographic dye allergy status: Secondary | ICD-10-CM

## 2023-11-08 DIAGNOSIS — Z1152 Encounter for screening for COVID-19: Secondary | ICD-10-CM

## 2023-11-08 LAB — COMPREHENSIVE METABOLIC PANEL WITH GFR
ALT: 11 U/L (ref 0–44)
AST: 23 U/L (ref 15–41)
Albumin: 4.3 g/dL (ref 3.5–5.0)
Alkaline Phosphatase: 73 U/L (ref 38–126)
Anion gap: 16 — ABNORMAL HIGH (ref 5–15)
BUN: 79 mg/dL — ABNORMAL HIGH (ref 8–23)
CO2: 18 mmol/L — ABNORMAL LOW (ref 22–32)
Calcium: 8.5 mg/dL — ABNORMAL LOW (ref 8.9–10.3)
Chloride: 105 mmol/L (ref 98–111)
Creatinine, Ser: 12.57 mg/dL — ABNORMAL HIGH (ref 0.44–1.00)
GFR, Estimated: 3 mL/min — ABNORMAL LOW (ref >60)
Glucose, Bld: 162 mg/dL — ABNORMAL HIGH (ref 70–99)
Potassium: 4.9 mmol/L (ref 3.5–5.1)
Sodium: 139 mmol/L (ref 135–145)
Total Bilirubin: 0.6 mg/dL (ref 0.0–1.2)
Total Protein: 7.8 g/dL (ref 6.5–8.1)

## 2023-11-08 LAB — CBC WITH DIFFERENTIAL/PLATELET

## 2023-11-08 LAB — BLOOD GAS, VENOUS
Acid-base deficit: 9.8 mmol/L — ABNORMAL HIGH (ref 0.0–2.0)
Bicarbonate: 17.6 mmol/L — ABNORMAL LOW (ref 20.0–28.0)
O2 Saturation: 85.8 %
Patient temperature: 37
pCO2, Ven: 43 mmHg — ABNORMAL LOW (ref 44–60)
pH, Ven: 7.22 — ABNORMAL LOW (ref 7.25–7.43)
pO2, Ven: 59 mmHg — ABNORMAL HIGH (ref 32–45)

## 2023-11-08 MED ORDER — FUROSEMIDE 10 MG/ML IJ SOLN
80.0000 mg | Freq: Once | INTRAMUSCULAR | Status: AC
  Administered 2023-11-08: 80 mg via INTRAVENOUS
  Filled 2023-11-08: qty 8

## 2023-11-08 MED ORDER — MIDAZOLAM HCL 2 MG/2ML IJ SOLN
1.0000 mg | Freq: Once | INTRAMUSCULAR | Status: AC
  Administered 2023-11-08: 1 mg via INTRAVENOUS
  Filled 2023-11-08: qty 2

## 2023-11-08 MED ORDER — NITROGLYCERIN IN D5W 200-5 MCG/ML-% IV SOLN
0.0000 ug/min | INTRAVENOUS | Status: DC
  Administered 2023-11-08: 100 ug/min via INTRAVENOUS
  Filled 2023-11-08: qty 250

## 2023-11-08 MED ORDER — ONDANSETRON HCL 4 MG/2ML IJ SOLN
4.0000 mg | INTRAMUSCULAR | Status: AC
  Administered 2023-11-08: 4 mg via INTRAVENOUS
  Filled 2023-11-08: qty 2

## 2023-11-08 NOTE — ED Provider Notes (Signed)
 [facility] Provider Note    Event Date/Time   First MD Initiated Contact with Patient 11/08/23 2310     (approximate)   History   Respiratory Distress  Level 5 caveat:  history/ROS limited by acute/critical illness  HPI Jessica Patterson is a 63 y.o. female with history of end-stage renal disease on hemodialysis twice weekly with her next dialysis session scheduled for tomorrow morning.  She presents by EMS for severe shortness of breath, acute in onset while sleeping.  EMS arrived and found that she was 74% on room air in severe distress.  She had a blood pressure greater than 210 and received a dose of nitroglycerin spray.  They tried to put her on CPAP but she could not tolerate it so they had her on a nonrebreather.  The patient's blood pressures improved slightly upon arrival but is still severely short of breath although her oxygen has come up to the low to mid 90s.  She is able to answer simple questions and is moaning and yelling while being short of breath.  She denies a history of tobacco use and denies having COPD.  She denies missing any dialysis sessions.  She denies pain, just reports that she cannot breathe.     Physical Exam   Triage Vital Signs: ED Triage Vitals  Encounter Vitals Group     BP 11/08/23 2315 (!) 178/101     Girls Systolic BP Percentile --      Girls Diastolic BP Percentile --      Boys Systolic BP Percentile --      Boys Diastolic BP Percentile --      Pulse Rate 11/08/23 2312 (!) 113     Resp 11/08/23 2312 (!) 30     Temp --      Temp src --      SpO2 11/08/23 2312 97 %     Weight --      Height --      Head Circumference --      Peak Flow --      Pain Score --      Pain Loc --      Pain Education --      Exclude from Growth Chart --     Most recent vital signs: Vitals:   11/08/23 2312 11/08/23 2315  BP:  (!) 178/101  Pulse: (!) 113   Resp: (!) 30   SpO2: 97%     General: Awake, severe respiratory  distress.  Alert and able to follow instructions. CV:  Good peripheral perfusion.  Tachycardia, regular rhythm. Resp:  Severely increased respiratory effort with intercostal retractions and accessory muscle usage.  Coarse crackling breath sounds throughout all lung fields consistent with volume overload. Abd:  No distention.    ED Results / Procedures / Treatments   Labs (all labs ordered are listed, but only abnormal results are displayed) Labs Reviewed  RESP PANEL BY RT-PCR (RSV, FLU A&B, COVID)  [id number]  COMPREHENSIVE METABOLIC PANEL WITH GFR  CBC WITH DIFFERENTIAL/PLATELET  BLOOD GAS, VENOUS  BRAIN NATRIURETIC PEPTIDE     EKG  ED ECG REPORT I, Darleene Dome, the attending physician, personally viewed and interpreted this ECG.  Date: 11/08/2023 EKG Time: 23: 19 Rate: 110 Rhythm: Sinus tachycardia QRS Axis: normal Intervals: normal, LVH ST/T Wave abnormalities: Non-specific ST segment / T-wave changes, but no clear evidence of acute ischemia. Narrative Interpretation: no definitive evidence of acute ischemia; does not meet STEMI criteria.  RADIOLOGY See ED course for details   PROCEDURES:  Critical Care performed: Yes, see critical care procedure note(s)  .1-3 Lead EKG Interpretation  Performed by: Gordan Huxley, MD Authorized by: Gordan Huxley, MD     Interpretation: abnormal     ECG rate:  112   ECG rate assessment: tachycardic     Rhythm: sinus tachycardia     Ectopy: none     Conduction: normal   .Critical Care  Performed by: Gordan Huxley, MD Authorized by: Gordan Huxley, MD   Critical care provider statement:    Critical care time (minutes):  30   Critical care time was exclusive of:  Separately billable procedures and treating other patients   Critical care was necessary to treat or prevent imminent or life-threatening deterioration of the following conditions:  Respiratory failure   Critical care was time spent personally by me on the following  activities:  Development of treatment plan with patient or surrogate, evaluation of patient's response to treatment, examination of patient, obtaining history from patient or surrogate, ordering and performing treatments and interventions, ordering and review of laboratory studies, ordering and review of radiographic studies, pulse oximetry, re-evaluation of patient's condition and review of old charts     IMPRESSION / MDM / ASSESSMENT AND PLAN / ED COURSE  I reviewed the triage vital signs and the nursing notes.                              Differential diagnosis includes, but is not limited to, volume overload due to chronic kidney failure and flash pulmonary edema, obstructive lung disease, pneumonia, viral illness, other nonspecific heart failure possibly due to ACS  Patient's presentation is most consistent with acute presentation with potential threat to life or bodily function.  Labs/studies ordered: BNP, CMP, CBC with differential, respiratory viral panel, VBG, chest x-ray, EKG, troponin  Interventions/Medications given:  Medications  nitroGLYCERIN 50 mg in dextrose  5 % 250 mL (0.2 mg/mL) infusion (has no administration in time range)  furosemide (LASIX) injection 80 mg (has no administration in time range)    (Note:  hospital course my include additional interventions and/or labs/studies not listed above.)   The patient's presentation is very consistent with flash pulmonary edema and volume overload.  Patient's blood pressure is improved from in the field but is still quite hypertensive and I ordered nitroglycerin infusion to reduce preload.  I spoke with patient about the need for BiPAP and she agrees and is tolerating it at this point, but she is still very anxious and I ordered Versed  1 mg IV to help calm her down while we are treating her so that she can tolerate the BiPAP with less anxiety.  She inform me that she still produces urine and I see in her medical record that she  takes furosemide, so I ordered furosemide 80 mg IV to also promote diuresis.  Labs pending.  Low suspicion for infectious process but I will check respiratory viral panel to determine if a specific viral illness such as COVID-19 may be contributing to her symptoms.  EKG generally reassuring with tachycardia but no obvious ischemia suggestive of ACS  The patient is on the cardiac monitor to evaluate for evidence of arrhythmia and/or significant heart rate changes.   Clinical Course as of 11/09/23 0252  Fri Nov 08, 2023  2348 Blood gas, venous(!) No CO2 retention, mild acidosis [CF]  2349 DG Chest [facility] I  independently viewed and interpreted the patient's chest x-ray(s), and I also reviewed the radiologist's report.  Consistent with pulmonary edema [CF]  Sat Nov 09, 2023  0003 Patient tolerating BiPAP and doing better, titrating blood pressure down to about 157 systolic (still some diastolic hypertension). [CF]  0004 I am consulting the hospitalist team for admission.   [CF]  0028 I consulted by phone with the admitting hospitalist, and they will admit the patient - Dr. Hilma. [CF]    Clinical Course User Index [CF] Gordan Huxley, MD     FINAL CLINICAL IMPRESSION(S) / ED DIAGNOSES   Final diagnoses:  None     Rx / DC Orders   ED Discharge Orders     None        Note:  This document was prepared using Dragon voice recognition software and may include unintentional dictation errors.   Gordan Huxley, MD 11/09/23 [phone]

## 2023-11-08 NOTE — ED Triage Notes (Signed)
 Pt brought in by EMS from home for respiratory distress, 74% on RA, HR 115-120s, BP 210/98. 1 nitro spray given by EMS. Pt 15L NRB, 86% on arrival. Dialysis fistula L arm, COPD, CHF.

## 2023-11-09 ENCOUNTER — Observation Stay
Admit: 2023-11-09 | Discharge: 2023-11-09 | Disposition: A | Discharge status: Home / Self Care | Attending: Internal Medicine | Admitting: Internal Medicine

## 2023-11-09 ENCOUNTER — Other Ambulatory Visit: Payer: Self-pay

## 2023-11-09 ENCOUNTER — Encounter: Payer: Self-pay | Admitting: Oncology

## 2023-11-09 DIAGNOSIS — Z992 Dependence on renal dialysis: Secondary | ICD-10-CM | POA: Diagnosis not present

## 2023-11-09 DIAGNOSIS — Z888 Allergy status to other drugs, medicaments and biological substances status: Secondary | ICD-10-CM | POA: Diagnosis not present

## 2023-11-09 DIAGNOSIS — I5022 Chronic systolic (congestive) heart failure: Secondary | ICD-10-CM | POA: Diagnosis present

## 2023-11-09 DIAGNOSIS — I272 Pulmonary hypertension, unspecified: Secondary | ICD-10-CM | POA: Diagnosis present

## 2023-11-09 DIAGNOSIS — D72829 Elevated white blood cell count, unspecified: Secondary | ICD-10-CM | POA: Diagnosis present

## 2023-11-09 DIAGNOSIS — Z91041 Radiographic dye allergy status: Secondary | ICD-10-CM | POA: Diagnosis not present

## 2023-11-09 DIAGNOSIS — J9601 Acute respiratory failure with hypoxia: Principal | ICD-10-CM | POA: Diagnosis present

## 2023-11-09 DIAGNOSIS — E8770 Fluid overload, unspecified: Secondary | ICD-10-CM | POA: Diagnosis present

## 2023-11-09 DIAGNOSIS — Z1152 Encounter for screening for COVID-19: Secondary | ICD-10-CM | POA: Diagnosis not present

## 2023-11-09 DIAGNOSIS — N186 End stage renal disease: Secondary | ICD-10-CM | POA: Diagnosis present

## 2023-11-09 DIAGNOSIS — F418 Other specified anxiety disorders: Secondary | ICD-10-CM | POA: Diagnosis present

## 2023-11-09 DIAGNOSIS — Z79899 Other long term (current) drug therapy: Secondary | ICD-10-CM | POA: Diagnosis not present

## 2023-11-09 DIAGNOSIS — D631 Anemia in chronic kidney disease: Secondary | ICD-10-CM

## 2023-11-09 DIAGNOSIS — Z91040 Latex allergy status: Secondary | ICD-10-CM | POA: Diagnosis not present

## 2023-11-09 DIAGNOSIS — F419 Anxiety disorder, unspecified: Secondary | ICD-10-CM | POA: Diagnosis present

## 2023-11-09 DIAGNOSIS — Z885 Allergy status to narcotic agent status: Secondary | ICD-10-CM | POA: Diagnosis not present

## 2023-11-09 DIAGNOSIS — E875 Hyperkalemia: Secondary | ICD-10-CM | POA: Diagnosis present

## 2023-11-09 DIAGNOSIS — Z8616 Personal history of COVID-19: Secondary | ICD-10-CM | POA: Diagnosis not present

## 2023-11-09 DIAGNOSIS — I132 Hypertensive heart and chronic kidney disease with heart failure and with stage 5 chronic kidney disease, or end stage renal disease: Secondary | ICD-10-CM | POA: Diagnosis present

## 2023-11-09 DIAGNOSIS — I160 Hypertensive urgency: Secondary | ICD-10-CM | POA: Diagnosis present

## 2023-11-09 DIAGNOSIS — E785 Hyperlipidemia, unspecified: Secondary | ICD-10-CM | POA: Diagnosis present

## 2023-11-09 DIAGNOSIS — I3139 Other pericardial effusion (noninflammatory): Secondary | ICD-10-CM | POA: Diagnosis present

## 2023-11-09 DIAGNOSIS — J810 Acute pulmonary edema: Secondary | ICD-10-CM | POA: Diagnosis present

## 2023-11-09 DIAGNOSIS — F32A Depression, unspecified: Secondary | ICD-10-CM | POA: Diagnosis present

## 2023-11-09 DIAGNOSIS — Z881 Allergy status to other antibiotic agents status: Secondary | ICD-10-CM | POA: Diagnosis not present

## 2023-11-09 LAB — CBC WITH DIFFERENTIAL/PLATELET
Abs Immature Granulocytes: 0.04 K/uL (ref 0.00–0.07)
Basophils Absolute: 0.2 K/uL — ABNORMAL HIGH (ref 0.0–0.1)
Basophils Relative: 1 %
Eosinophils Absolute: 1.2 K/uL — ABNORMAL HIGH (ref 0.0–0.5)
Eosinophils Relative: 9 %
HCT: 33.2 % — ABNORMAL LOW (ref 36.0–46.0)
Hemoglobin: 10.3 g/dL — ABNORMAL LOW (ref 12.0–15.0)
Immature Granulocytes: 0 %
Lymphocytes Relative: 39 %
Lymphs Abs: 5.3 K/uL — ABNORMAL HIGH (ref 0.7–4.0)
MCH: 30.7 pg (ref 26.0–34.0)
MCHC: 31 g/dL (ref 30.0–36.0)
MCV: 99.1 fL (ref 80.0–100.0)
Monocytes Absolute: 0.5 K/uL (ref 0.1–1.0)
Monocytes Relative: 4 %
Neutro Abs: 6.4 K/uL (ref 1.7–7.7)
Neutrophils Relative %: 47 %
Platelets: 392 K/uL (ref 150–400)
RBC: 3.35 MIL/uL — ABNORMAL LOW (ref 3.87–5.11)
RDW: 16.9 % — ABNORMAL HIGH (ref 11.5–15.5)
Smear Review: NORMAL
WBC: 13.6 K/uL — ABNORMAL HIGH (ref 4.0–10.5)
nRBC: 0 % (ref 0.0–0.2)

## 2023-11-09 LAB — CBC
HCT: 25.7 % — ABNORMAL LOW (ref 36.0–46.0)
Hemoglobin: 8.1 g/dL — ABNORMAL LOW (ref 12.0–15.0)
MCH: 30.6 pg (ref 26.0–34.0)
MCHC: 31.5 g/dL (ref 30.0–36.0)
MCV: 97 fL (ref 80.0–100.0)
Platelets: 251 K/uL (ref 150–400)
RBC: 2.65 MIL/uL — ABNORMAL LOW (ref 3.87–5.11)
RDW: 16.5 % — ABNORMAL HIGH (ref 11.5–15.5)
WBC: 11.5 K/uL — ABNORMAL HIGH (ref 4.0–10.5)
nRBC: 0 % (ref 0.0–0.2)

## 2023-11-09 LAB — BASIC METABOLIC PANEL WITH GFR
Anion gap: 16 — ABNORMAL HIGH (ref 5–15)
BUN: 80 mg/dL — ABNORMAL HIGH (ref 8–23)
CO2: 17 mmol/L — ABNORMAL LOW (ref 22–32)
Calcium: 7.6 mg/dL — ABNORMAL LOW (ref 8.9–10.3)
Chloride: 104 mmol/L (ref 98–111)
Creatinine, Ser: 12.63 mg/dL — ABNORMAL HIGH (ref 0.44–1.00)
GFR, Estimated: 3 mL/min — ABNORMAL LOW (ref >60)
Glucose, Bld: 106 mg/dL — ABNORMAL HIGH (ref 70–99)
Potassium: 6.5 mmol/L (ref 3.5–5.1)
Sodium: 137 mmol/L (ref 135–145)

## 2023-11-09 LAB — TROPONIN I (HIGH SENSITIVITY)
Troponin I (High Sensitivity): 28 ng/L — ABNORMAL HIGH (ref <18)
Troponin I (High Sensitivity): 208 ng/L (ref <18)
Troponin I (High Sensitivity): 194 ng/L (ref <18)
Troponin I (High Sensitivity): 150 ng/L (ref <18)

## 2023-11-09 LAB — HIV ANTIBODY (ROUTINE TESTING W REFLEX): HIV Screen 4th Generation wRfx: NONREACTIVE

## 2023-11-09 LAB — RESP PANEL BY RT-PCR (RSV, FLU A&B, COVID)  RVPGX2
Influenza A by PCR: NEGATIVE
Influenza B by PCR: NEGATIVE
Resp Syncytial Virus by PCR: NEGATIVE
SARS Coronavirus 2 by RT PCR: NEGATIVE

## 2023-11-09 LAB — HEPATITIS B SURFACE ANTIGEN: Hepatitis B Surface Ag: NONREACTIVE

## 2023-11-09 LAB — BRAIN NATRIURETIC PEPTIDE: B Natriuretic Peptide: 3044.7 pg/mL — ABNORMAL HIGH (ref 0.0–100.0)

## 2023-11-09 LAB — MAGNESIUM: Magnesium: 2.4 mg/dL (ref 1.7–2.4)

## 2023-11-09 MED ORDER — ONDANSETRON HCL 4 MG/2ML IJ SOLN
4.0000 mg | Freq: Three times a day (TID) | INTRAMUSCULAR | Status: DC | PRN
  Administered 2023-11-09: 4 mg via INTRAVENOUS

## 2023-11-09 MED ORDER — PANTOPRAZOLE SODIUM 40 MG PO TBEC
40.0000 mg | DELAYED_RELEASE_TABLET | Freq: Every day | ORAL | Status: DC
  Administered 2023-11-09 – 2023-11-10 (×2): 40 mg via ORAL
  Filled 2023-11-09 (×3): qty 1

## 2023-11-09 MED ORDER — SODIUM BICARBONATE 650 MG PO TABS
650.0000 mg | ORAL_TABLET | ORAL | Status: DC
  Administered 2023-11-09: 650 mg via ORAL
  Filled 2023-11-09 (×2): qty 1

## 2023-11-09 MED ORDER — ACETAMINOPHEN 325 MG PO TABS
ORAL_TABLET | ORAL | Status: AC
  Filled 2023-11-09: qty 2

## 2023-11-09 MED ORDER — NITROGLYCERIN 0.1 MG/HR TD PT24
0.1000 mg | MEDICATED_PATCH | Freq: Once | TRANSDERMAL | Status: AC
  Administered 2023-11-09: 0.1 mg via TRANSDERMAL
  Filled 2023-11-09: qty 1

## 2023-11-09 MED ORDER — DEXTROSE 50 % IV SOLN
50.0000 mL | INTRAVENOUS | Status: AC
  Administered 2023-11-09: 50 mL via INTRAVENOUS
  Filled 2023-11-09: qty 50

## 2023-11-09 MED ORDER — FLUTICASONE PROPIONATE 50 MCG/ACT NA SUSP: 2.0000 | Freq: Every day | NASAL | Status: DC | PRN

## 2023-11-09 MED ORDER — CETIRIZINE HCL 10 MG PO TABS: 10.0000 mg | ORAL_TABLET | Freq: Every day | ORAL | Status: DC | PRN

## 2023-11-09 MED ORDER — SODIUM ZIRCONIUM CYCLOSILICATE 10 G PO PACK
10.0000 g | PACK | ORAL | Status: AC
  Administered 2023-11-09: 10 g via ORAL
  Filled 2023-11-09: qty 1

## 2023-11-09 MED ORDER — BUSPIRONE HCL 10 MG PO TABS
5.0000 mg | ORAL_TABLET | Freq: Two times a day (BID) | ORAL | Status: DC
  Administered 2023-11-09 – 2023-11-10 (×2): 5 mg via ORAL
  Filled 2023-11-09 (×2): qty 1

## 2023-11-09 MED ORDER — RENA-VITE PO TABS
1.0000 | ORAL_TABLET | Freq: Every day | ORAL | Status: DC
  Administered 2023-11-09 – 2023-11-10 (×2): 1 via ORAL
  Filled 2023-11-09 (×2): qty 1

## 2023-11-09 MED ORDER — ACETAMINOPHEN 325 MG PO TABS
650.0000 mg | ORAL_TABLET | Freq: Four times a day (QID) | ORAL | Status: DC | PRN
  Administered 2023-11-09: 650 mg via ORAL
  Filled 2023-11-09: qty 2

## 2023-11-09 MED ORDER — INSULIN ASPART 100 UNIT/ML IV SOLN
5.0000 [IU] | INTRAVENOUS | Status: AC
  Administered 2023-11-09: 5 [IU] via INTRAVENOUS
  Filled 2023-11-09: qty 0.05

## 2023-11-09 MED ORDER — LOSARTAN POTASSIUM 50 MG PO TABS
50.0000 mg | ORAL_TABLET | Freq: Every day | ORAL | Status: DC
  Administered 2023-11-09 – 2023-11-10 (×2): 50 mg via ORAL
  Filled 2023-11-09 (×3): qty 1

## 2023-11-09 MED ORDER — ALBUTEROL SULFATE HFA 108 (90 BASE) MCG/ACT IN AERS: 2.0000 | INHALATION_SPRAY | RESPIRATORY_TRACT | Status: DC | PRN

## 2023-11-09 MED ORDER — OXYCODONE-ACETAMINOPHEN 5-325 MG PO TABS: 1.0000 | ORAL_TABLET | Freq: Four times a day (QID) | ORAL | Status: DC | PRN

## 2023-11-09 MED ORDER — ALBUTEROL SULFATE (2.5 MG/3ML) 0.083% IN NEBU: 2.5000 mg | INHALATION_SOLUTION | RESPIRATORY_TRACT | Status: DC | PRN

## 2023-11-09 MED ORDER — MONTELUKAST SODIUM 10 MG PO TABS: 10.0000 mg | ORAL_TABLET | Freq: Every day | ORAL | Status: DC | PRN

## 2023-11-09 MED ORDER — MAGNESIUM OXIDE -MG SUPPLEMENT 400 (240 MG) MG PO TABS
400.0000 mg | ORAL_TABLET | Freq: Every day | ORAL | Status: DC
  Administered 2023-11-09 – 2023-11-10 (×2): 400 mg via ORAL
  Filled 2023-11-09 (×3): qty 1

## 2023-11-09 MED ORDER — CHLORHEXIDINE GLUCONATE CLOTH 2 % EX PADS: 6.0000 | MEDICATED_PAD | Freq: Every day | CUTANEOUS | Status: DC

## 2023-11-09 MED ORDER — FUROSEMIDE 40 MG PO TABS
80.0000 mg | ORAL_TABLET | Freq: Every day | ORAL | Status: DC
  Administered 2023-11-09 – 2023-11-10 (×2): 80 mg via ORAL
  Filled 2023-11-09 (×3): qty 2

## 2023-11-09 MED ORDER — ONDANSETRON HCL 4 MG/2ML IJ SOLN
INTRAMUSCULAR | Status: AC
Start: 2023-11-09 — End: 2023-11-09
  Filled 2023-11-09: qty 2

## 2023-11-09 MED ORDER — DM-GUAIFENESIN ER 30-600 MG PO TB12: 1.0000 | ORAL_TABLET | Freq: Two times a day (BID) | ORAL | Status: DC | PRN

## 2023-11-09 MED ORDER — HYDROXYZINE HCL 25 MG PO TABS: 25.0000 mg | ORAL_TABLET | Freq: Four times a day (QID) | ORAL | Status: DC | PRN

## 2023-11-09 MED ORDER — CYCLOBENZAPRINE HCL 10 MG PO TABS
10.0000 mg | ORAL_TABLET | Freq: Every evening | ORAL | Status: DC | PRN
  Administered 2023-11-09: 10 mg via ORAL
  Filled 2023-11-09: qty 1

## 2023-11-09 MED ORDER — LABETALOL HCL 5 MG/ML IV SOLN: 10.0000 mg | INTRAVENOUS | Status: DC | PRN

## 2023-11-09 MED ORDER — HEPARIN SODIUM (PORCINE) 5000 UNIT/ML IJ SOLN
5000.0000 [IU] | Freq: Three times a day (TID) | INTRAMUSCULAR | Status: DC
  Administered 2023-11-09 – 2023-11-10 (×4): 5000 [IU] via SUBCUTANEOUS
  Filled 2023-11-09 (×4): qty 1

## 2023-11-09 NOTE — Plan of Care (Signed)

## 2023-11-09 NOTE — Progress Notes (Signed)
 [facility] [place], [place] 11/09/23  Subjective:   Hospital day # 0  Patient known to our practice from outpatient dialysis last dialysis treatment was on September 30.  He presented by EMS for severe shortness of breath which was acute onset while sleeping.  She was found to be 75% oxygen saturation on room air.  Blood pressure was elevated at greater than 210.  She required nonrebreather and BiPAP.  She was then admitted to hospitalist team for further management. During the workup she was noted to have elevated potassium 6.5.  He was treated with IV insulin D50 and Lokelma. Urgent hemodialysis requested for the morning. She was seen at dialysis this morning.  Overall able to breathe better. Currently on nasal cannula oxygen.    Objective:  Vital signs in last 24 hours:  Temp:  [97.8 F (36.6 C)-98.4 F (36.9 C)] 98.4 F (36.9 C) (10/04 0901) Pulse Rate:  [74-113] 79 (10/04 1200) Resp:  [15-32] 17 (10/04 1200) BP: (138-181)/(76-101) 148/76 (10/04 1200) SpO2:  [87 %-100 %] 95 % (10/04 1200) FiO2 (%):  [50 %] 50 % (10/03 2315) Weight:  [54.9 kg-55.1 kg] 54.9 kg (10/04 0901)  Weight change:  Filed Weights   11/09/23 0500 11/09/23 0901  Weight: 55.1 kg 54.9 kg    Intake/Output:    Intake/Output Summary (Last 24 hours) at 11/09/2023 1217 Last data filed at 11/09/2023 0600 Gross per 24 hour  Intake 304.63 ml  Output 200 ml  Net 104.63 ml     Physical Exam: General: No acute distress, laying in the bed  HEENT Moist oral mucous membranes  Pulm/lungs [place] O2, normal breathing effort, coarse breath sounds  CVS/Heart Regular rhythm  Abdomen:  Nontender nondistended  Extremities: No significant edema  Neurologic: Alert, oriented  Skin: No acute rashes  Access: Left upper extremity AV graft       Basic Metabolic Panel:  Recent Labs  Lab 11/08/23 2317 11/09/23 0451  NA 139 137  K 4.9 6.5*  CL 105 104  CO2 18* 17*  GLUCOSE 162* 106*  BUN 79*  80*  CREATININE 12.57* 12.63*  CALCIUM 8.5* 7.6*  MG  --  2.4     CBC: Recent Labs  Lab 11/08/23 2317 11/09/23 0451  WBC 13.6* 11.5*  NEUTROABS 6.4  --   HGB 10.3* 8.1*  HCT 33.2* 25.7*  MCV 99.1 97.0  PLT 392 251      Lab Results  Component Value Date   HEPBSAG NON REACTIVE 09/19/2019      Microbiology:  Recent Results (from the past 240 hours)  Resp panel by RT-PCR (RSV, Flu A&B, Covid) Anterior Nasal Swab     Status: None   Collection Time: 11/08/23 11:17 PM   Specimen: Anterior Nasal Swab  Result Value Ref Range Status   SARS Coronavirus 2 by RT PCR NEGATIVE NEGATIVE Final    Comment: (NOTE) SARS-CoV-2 target nucleic acids are NOT DETECTED.  The SARS-CoV-2 RNA is generally detectable in upper respiratory specimens during the acute phase of infection. The lowest concentration of SARS-CoV-2 viral copies this assay can detect is 138 copies/mL. A negative result does not preclude SARS-Cov-2 infection and should not be used as the sole basis for treatment or other patient management decisions. A negative result may occur with  improper specimen collection/handling, submission of specimen other than nasopharyngeal swab, presence of viral mutation(s) within the areas targeted by this assay, and inadequate number of viral copies(<138 copies/mL). A negative result must be combined  with clinical observations, patient history, and epidemiological information. The expected result is Negative.  Fact Sheet for Patients:  BloggerCourse.com  Fact Sheet for Healthcare Providers:  SeriousBroker.it  This test is no t yet approved or cleared by the [place]  FDA and  has been authorized for detection and/or diagnosis of SARS-CoV-2 by FDA under an Emergency Use Authorization (EUA). This EUA will remain  in effect (meaning this test can be used) for the duration of the COVID-19 declaration under Section 564(b)(1) of the  Act, 21 U.S.C.section 360bbb-3(b)(1), unless the authorization is terminated  or revoked sooner.       Influenza A by PCR NEGATIVE NEGATIVE Final   Influenza B by PCR NEGATIVE NEGATIVE Final    Comment: (NOTE) The Xpert Xpress SARS-CoV-2/FLU/RSV plus assay is intended as an aid in the diagnosis of influenza from Nasopharyngeal swab specimens and should not be used as a sole basis for treatment. Nasal washings and aspirates are unacceptable for Xpert Xpress SARS-CoV-2/FLU/RSV testing.  Fact Sheet for Patients: BloggerCourse.com  Fact Sheet for Healthcare Providers: SeriousBroker.it  This test is not yet approved or cleared by the [place]  FDA and has been authorized for detection and/or diagnosis of SARS-CoV-2 by FDA under an Emergency Use Authorization (EUA). This EUA will remain in effect (meaning this test can be used) for the duration of the COVID-19 declaration under Section 564(b)(1) of the Act, 21 U.S.C. section 360bbb-3(b)(1), unless the authorization is terminated or revoked.     Resp Syncytial Virus by PCR NEGATIVE NEGATIVE Final    Comment: (NOTE) Fact Sheet for Patients: BloggerCourse.com  Fact Sheet for Healthcare Providers: SeriousBroker.it  This test is not yet approved or cleared by the [place]  FDA and has been authorized for detection and/or diagnosis of SARS-CoV-2 by FDA under an Emergency Use Authorization (EUA). This EUA will remain in effect (meaning this test can be used) for the duration of the COVID-19 declaration under Section 564(b)(1) of the Act, 21 U.S.C. section 360bbb-3(b)(1), unless the authorization is terminated or revoked.  Performed at [facility], [address] [place], [place] [address]     Coagulation Studies: No results for input(s): LABPROT, INR in the last 72 hours.  Urinalysis: No results for  input(s): COLORURINE, LABSPEC, PHURINE, GLUCOSEU, HGBUR, BILIRUBINUR, KETONESUR, PROTEINUR, UROBILINOGEN, NITRITE, LEUKOCYTESUR in the last 72 hours.  Invalid input(s): APPERANCEUR    Imaging: DG Chest Port 1 View Result Date: 11/08/2023 CLINICAL DATA:  Dyspnea EXAM: PORTABLE CHEST 1 VIEW COMPARISON:  10/14/2023 FINDINGS: Cardiomegaly. Extensive bilateral pulmonary edema or infiltrates compared to prior. No sizable effusion. Aortic atherosclerosis. No pneumothorax IMPRESSION: Cardiomegaly with extensive bilateral pulmonary edema or infiltrates. Electronically Signed   By: Luke Bun M.D.   On: 11/08/2023 23:53     Medications:     busPIRone  5 mg Oral BID   Chlorhexidine  Gluconate Cloth  6 each Topical Q0600   furosemide  80 mg Oral Daily   heparin   5,000 Units Subcutaneous Q8H   losartan  50 mg Oral Daily   magnesium oxide  400 mg Oral Daily   multivitamin  1 tablet Oral Daily   nitroGLYCERIN  0.1 mg Transdermal Once   pantoprazole  40 mg Oral Daily   sodium bicarbonate   650 mg Oral Once per day on Tuesday Saturday   acetaminophen , albuterol , cetirizine, cyclobenzaprine , dextromethorphan-guaiFENesin , fluticasone, hydrOXYzine, labetalol, montelukast, ondansetron  (ZOFRAN ) IV, oxyCODONE -acetaminophen   Assessment/ Plan:  63 y.o. female with end-stage renal disease, severe hypertension, heart failure with reduced ejection fraction  admitted on 11/08/2023 for Flash pulmonary edema (HCC) [J81.0] Acute respiratory failure with hypoxia (HCC) [J96.01] Fluid overload [E87.70] Chronic kidney disease with end stage renal failure on dialysis (HCC) [N18.6, Z99.2]   [facility]/53.5 kg/TTS/left upper extremity AV graft  Severe hyperkalemia Managed with IV insulin/D50, Lokelma orally. Dialyzed on low potassium bath. Expected to correct with dialysis today.  End-stage renal disease, with shortness of breath Likely caused by severe hypertension which is  uncontrolled. Patient was recently evaluated by cardiologist as outpatient on 11/06/2023.  Amlodipine was discontinued and patient was started on losartan 50 mg once a day.  She was also to be started on bisoprolol once daily in 2 weeks.  A repeat 2D echo was planned to evaluate pericardial effusion. Plan for urgent hemodialysis today.  Volume removal about 2 L as tolerated. Blood pressure now in the 140s to 150s systolic.  Currently on 2 L nasal cannula oxygen. Continue to monitor during hospitalization. Obtain standing weight postdialysis to establish target weight.  Hypertension Continue losartan at current dose. Blood pressure control improved after dialysis.  Anemia in chronic kidney disease Hemoglobin has decreased to 8.1 today. Yesterday's hemoglobin was 10.3.  Monitor closely.     LOS: 0 Elham Fini 10/4/202512:17 PM  [facility] [place], [place] [phone]  Note: This note was prepared with Dragon dictation. Any transcription errors are unintentional

## 2023-11-09 NOTE — Progress Notes (Signed)
 Hemodialysis Note:   Received patient in bed to unit. Alert and oriented. Informed consent signed and in chart.   3.5 hour dialysis treatment time   Access used: Left AVF, 15gauges Access issues: None   Experienced headache during treatment, prn tylenol  given and nitroglycerin patch removed(per Dr.Alexander). Oxygen weaned down to 2liters from 4liters, patient tolerated well. Access functioned well     Total UF removed: 2 liters Medications given:  Tylenol      11/09/23 1400  Vitals  Temp 98.5 F (36.9 C)  Temp Source Oral  BP (!) 148/74  MAP (mmHg) 95  BP Location Right Arm  BP Method Automatic  Patient Position (if appropriate) Lying  Pulse Rate 81  Pulse Rate Source Monitor  ECG Heart Rate 82  Resp 17  Weight 52.5 kg (standing)  Type of Weight Post-Dialysis  Oxygen Therapy  SpO2 96 %  O2 Device Nasal Cannula  O2 Flow Rate (L/min) 2 L/min  Post Treatment  Dialyzer Clearance Lightly streaked  Liters Processed 84  Fluid Removed (mL) 2000 mL  Tolerated HD Treatment Yes  Post-Hemodialysis Comments Tolerated treatment well  AVG/AVF Arterial Site Held (minutes) 10 minutes  AVG/AVF Venous Site Held (minutes) 10 minutes  Fistula / Graft Left Upper arm  Placement Date/Time: 05/31/23 (c) 1018   Orientation: Left  Access Location: Upper arm  Site Condition No complications  Fistula / Graft Assessment Present;Thrill;Bruit  Status Deaccessed  Drainage Description None

## 2023-11-09 NOTE — Hospital Course (Addendum)
 Hospital course / significant events:   HPI:  Jessica Patterson is a 63 y.o. 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 Started 10/03 morning, progressively worsening.  She denies cough, chest pain, fever or chills.  SpO2 74% on room air, initially started on NRB, could not tolerate CPAP by EMS.  Her BP was 210/98, down to 138/90 after one dose of nitroglycerin spray by EMS.   10/03: to ED. Chest x-ray showed cardiomegaly and extensive pulmonary edema.  10/03-10/04 overnight: Patient admitted to hospitalists.  Consulted nephro for dialysis.  10/04: overnight/early am - hyperkalemia, given insulin/D50 + lokelma, no chest pain, troponin 208 --> 194. BNP 3000+. Reviewed chart, recent echo 09/19 EF 40-45 w/ grade 2 diast df, mild/mod MR, mild pulm HTN, small circumferential pericardial effusion      Consultants:  Nephrology   Procedures/Surgeries: none      ASSESSMENT & PLAN:   Acute respiratory failure with hypoxia due to fluid overload / question CHF uncertain type in the setting of ESRD:  recent echo 09/19 EF 40-45 w/ grade 2 diast df, mild/mod MR, mild pulm HTN, small circumferential pericardial effusion  Treat underlying cause(s) as below re ESRD and CHF Bronchodilators as needed Mucinex  wean off BiPAP as able --> now on [place], continue supplemental O2    HFrEF and diastolic dysfunction recent echo 09/19 EF 40-45 w/ grade 2 diast df, mild/mod MR, mild pulm HTN, small circumferential pericardial effusion  80 mg Lasix was given in ED, continue diuresis as needed but dialysis will manage fluid status for the most part  Fluid restriction  Pericardial effusion, small Per cardiology notes, planning repeat echo 12/2023 to [facility]  Repeat echo sooner if needed for chest pain, worsening SOB  ESRD on dialysis Patient is doing dialysis on Tuesday and Saturday, which may not be  enough. Nephrology to follow    Hypertensive urgency Initial blood pressure 210/98 --> 130/90 after received 1 dose of nitroglycerin spray Home meds losartan 50 mg daily and Lasix 80 mg daily, states that she stopped taking amlodipine and Coreg On review cardiology notes - amlodipine was dc, startedon losartan, plan to start on bisoprolol Resume antihypertensive regimen w/ losartan IV labetalol prn  0.1 mg of nitroglycerin patch --> removed at pt's request, she attributes headache to this medication   Hyperkalemia in setting of renal failure  Monitor BMP Correct w/ dialysis    Anemia in ESRD (end-stage renal disease)  Hemoglobin stable 10.3 (8.6 on 11/01/2023) Follow CBC   Leukocytosis WBC 13.6, no fever, no source of infection identified, likely reactive Follow CBC   Depression with anxiety Continue home BuSpar and hydroxyzine        No concerns based on BMI: Body mass index is 23.72 kg/m.[name] Significantly low or high BMI is associated with higher medical risk.  Underweight - under 18  overweight - 25 to 29 obese - 30 or more Class 1 obesity: BMI of 30.0 to 34 Class 2 obesity: BMI of 35.0 to 39 Class 3 obesity: BMI of 40.0 to 49 Super Morbid Obesity: BMI 50-59 Super-super Morbid Obesity: BMI 60+ Healthy nutrition and physical activity advised as adjunct to other disease management and risk reduction treatments    DVT prophylaxis: heparin  IV fluids: no continuous IV fluids  Nutrition: renal / fluid restricted Central lines / other devices: none  Code Status: FULL CODE ACP documentation reviewed:  none on file in  [facility]  TOC needs: TBD Medical barriers to dispo: O2 requirement, . Expected medical readiness for discharge next 1-2 days.

## 2023-11-09 NOTE — Progress Notes (Signed)
 Overnight cross coverage TRH, hospitalist service.   Was informed of abnormal lab results with potassium 6.5 and elevated troponin.  Patient denies chest pain.  Stat twelve-lead EKG obtained.  IV insulin 5 units x 1, D50 x 1 and Lokelma 10 g x 1 ordered.  High-sensitivity troponin ordered in the ED resulted, 208.  EKG is pending, cycling troponins.  Follow transthoracic echo.  No charge note.

## 2023-11-09 NOTE — Progress Notes (Signed)
 Transported patient to 2nd floor from ER on bipap with no events.

## 2023-11-09 NOTE — H&P (Addendum)
 History and Physical    Jessica Patterson FMW:[id number] DOB: [birth year] DOA: 11/08/2023  Referring MD/NP/PA:   PCP: Ricard Tawni KIDD, MD   Patient coming from:  The patient is coming from home.     Chief Complaint: SOB  HPI: Jessica Patterson is a 63 y.o. 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  Patient states that her shortness of breath that started this morning, which has been progressively worsening.  She denies cough, chest pain, fever or chills.  She was found to have oxygen desaturation to 74% on room air, initially started on nonrebreather, could not tolerate CPAP by EMS.  Her blood pressure was elevated to 210/98, which imroved to 138/90 after received one dose of nitroglycerin spray by EMS. At arrival to ED, patient still has severe respiratory distress, using accessory muscle for breathing, BiPAP is started with saturation 98%.  No nausea, vomiting, diarrhea or abdominal pain.  No symptoms UTI.  She had dialysis on Tuesday.    Data reviewed independently and ED Course: pt was found to have WBC 13.6, potassium 4.9, bicarbonate 18, creatinine 12.57, BUN 79, negative PCR for COVID, flu and RSV, temperature normal, heart rate 113--> 98, RR 32--> 26.  Chest x-ray showed cardiomegaly and extensive pulmonary edema.  Patient is place in PCU for obs.  Consulted Dr. Dennise of renal for dialysis.   EKG: I have personally reviewed.  Sinus rhythm, QTc 451, LAE, poor R wave progression.   Review of Systems:   General: no fevers, chills, no body weight gain, has fatigue HEENT: no blurry vision, hearing changes or sore throat Respiratory: has dyspnea, no coughing, wheezing CV: no chest pain, no palpitations GI: no nausea, vomiting, abdominal pain, diarrhea, constipation GU: no dysuria, burning on urination, increased urinary frequency, hematuria  Ext: has leg edema Neuro:  no unilateral weakness, numbness, or tingling, no vision change or hearing loss Skin: no rash, no skin tear. MSK: No muscle spasm, no deformity, no limitation of range of movement in spin Heme: No easy bruising.  Travel history: No recent long distant travel.   Allergy:  Allergies  Allergen Reactions   Amlodipine Shortness Of Breath and Other (See Comments)   Cephalosporins Shortness Of Breath and Nausea And Vomiting    Has tolerated 2nd (CEFOXITIN) and 3rd (CEFPODOXIME) generation cephalosporins in the past without documented ADRs.    Codeine Shortness Of Breath and Nausea And Vomiting   Hydrochlorothiazide Shortness Of Breath and Other (See Comments)   Hydromorphone Hives, Nausea And Vomiting and Other (See Comments)    Other Reaction(s): GI Intolerance   Latex Itching and Rash    Other reaction(s): Itching  Other reaction(s): Itching    Other reaction(s): Itching Other reaction(s): Itching   Losartan Potassium Shortness Of Breath and Other (See Comments)    Other reaction(s): Shortness Of Breath Other reaction(s): Shortness Of Breath    Metoprolol Other (See Comments)   Doxycycline Hives and Rash    Other reaction(s): Hives Other reaction(s): Hives    Egg-Derived Products Nausea And Vomiting   Lisinopril     Other Reaction(s): cough (is bothersome, but cannot tolerate any other meds - see rest of reactions)   Sucralfate Rash   Cholecalciferol Other (See Comments)   Ciprofloxacin Other (See Comments)   Clonidine Hcl Nausea And Vomiting   Diltiazem    Doxazosin Mesylate Other (See Comments)   Hydralazine   Hydralazine  Hcl Other (See Comments)   Iodine-131 Other (See Comments)    Can not receive due to Kidney disease Can not receive due to Kidney disease    Ivp Dye [Iodinated Contrast Media]    Methyldopa Nausea Only    Blisters    Nifedipine     Other Reaction(s): Other (See Comments)  CHEST PAIN ACHING   Ranitidine Hcl Other (See Comments)   Sulfa  Antibiotics    Cefuroxime Axetil Nausea And Vomiting   Diltiazem Hcl Nausea Only    Other Reaction(s): GI Intolerance   Isosorbide Nausea And Vomiting and Other (See Comments)    Other Reaction(s): GI Intolerance   Morphine Hives, Nausea And Vomiting and Other (See Comments)    Other Reaction(s): GI Intolerance   Penicillin G Rash    welts    Prazosin Other (See Comments)    Headaches    Past Medical History:  Diagnosis Date   Anemia    ESRD   Anxiety    Cervical dysplasia    CKD (chronic kidney disease), stage IV (HCC)    Depression    Exstrophy of bladder    GERD (gastroesophageal reflux disease)    History of kidney stones    Hyperlipidemia    Hypertension    Kidney stone    Pneumonia due to COVID-19 virus    PONV (postoperative nausea and vomiting)    Recurrent UTI    Seasonal allergies    Sepsis (HCC)     Past Surgical History:  Procedure Laterality Date   ABDOMINAL HERNIA REPAIR N/A 07/12/2017   ABDOMINAL HYSTERECTOMY N/A 12/23/2015   Boyce-Vest procedure     age 23   CERVICAL CONE BIOPSY  05/26/2013   COLOSTOMY N/A    COLPOSCOPY  07/19/2015   DIAGNOSTIC LAPAROSCOPY  09/18/2017   abdominal wall mesh excision   DIALYSIS/PERMA CATHETER INSERTION     DIALYSIS/PERMA CATHETER REMOVAL N/A 09/05/2023   Procedure: DIALYSIS/PERMA CATHETER REMOVAL;  Surgeon: Marea Selinda RAMAN, MD;  Location: [facility];  Service: Cardiovascular;  Laterality: N/A;   INSERTION OF ARTERIOVENOUS (AV) ARTEGRAFT ARM Left 05/31/2023   Procedure: INSERTION, GRAFT, ARTERIOVENOUS, UPPER EXTREMITY;  Surgeon: Jama Cordella MATSU, MD;  Location: [facility] ORS;  Service: Vascular;  Laterality: Left;  BRACHIAL AXILLARY   KIDNEY STONE SURGERY     NEPHROSTOMY TUBE PLACEMENT ([facility] HX) N/A    SMALL INTESTINE SURGERY     x3   URETEROSIGMOIDOSTOMY      Social History:  reports that she has never smoked. She has never used smokeless tobacco. She reports that she does not drink alcohol and does not use  drugs.  Family History:  Family History  Problem Relation Age of Onset   Lung cancer Mother    Stroke Father    Breast cancer Neg Hx      Prior to Admission medications   Medication Sig Start Date End Date Taking? Authorizing Provider  acetaminophen  (TYLENOL ) 325 MG tablet Take 650 mg by mouth every 6 (six) hours as needed for moderate pain (pain score 4-6).    [provider]  amLODipine (NORVASC) 10 MG tablet Take 10 mg by mouth daily. 02/15/23   [provider]  busPIRone (BUSPAR) 5 MG tablet Take 5 mg by mouth 2 (two) times daily. 02/15/23   [provider]  carvedilol (COREG) 12.5 MG tablet Take 12.5 mg by mouth 2 (two) times daily. Patient not taking: Reported on 09/20/2023 04/09/23   [provider]  cetirizine (  ZYRTEC) 10 MG tablet Take 10 mg by mouth daily as needed.    [provider]  cyclobenzaprine  (FLEXERIL ) 10 MG tablet Take 10 mg by mouth at bedtime as needed for muscle spasms. 09/16/19   [provider]  Epoetin  Alfa-epbx (RETACRIT  IJ) Inject as directed. Monthly injection at cancer center    [provider]  fluticasone (FLONASE) 50 MCG/ACT nasal spray Place 2 sprays into both nostrils daily as needed for allergies or rhinitis.    [provider]  furosemide (LASIX) 40 MG tablet Take 40 mg by mouth See admin instructions. Take 40 mg by mouth on Sunday, Monday, Wednesday, Thursday and Friday 02/15/23   [provider]  hydrOXYzine (ATARAX) 25 MG tablet Take 25 mg by mouth every 6 (six) hours as needed for anxiety. 02/15/23   [provider]  montelukast (SINGULAIR) 10 MG tablet Take 10 mg by mouth daily as needed (allergies).    [provider]  oxyCODONE -acetaminophen  (PERCOCET) 5-325 MG tablet Take 1 tablet by mouth every 6 (six) hours as needed for severe pain (pain score 7-10) or moderate pain (pain score 4-6). 05/31/23 05/30/24  Schnier, Cordella MATSU, MD  sodium bicarbonate  650 MG  tablet Take 650 mg by mouth See admin instructions. Take 650 mg by mouth 3 times daily on Sunday, Monday, Wednesday, Thursday and Friday.    [provider]    Physical Exam: Vitals:   11/08/23 2339 11/08/23 2340 11/08/23 2345 11/09/23 0000  BP: (!) 154/96 (!) 157/95 (!) 158/87 (!) 138/90  Pulse: (!) 101 98 (!) 101 98  Resp: (!) 31 (!) 28 (!) 26 (!) 26  Temp:      TempSrc:      SpO2: 95% 94% 95% 98%   General: Has severe respiratory distress HEENT:       Eyes: PERRL, EOMI, no jaundice       ENT: No discharge from the ears and nose, no pharynx injection, no tonsillar enlargement.        Neck: positive JVD, no bruit, no mass felt. Heme: No neck lymph node enlargement. Cardiac: S1/S2, RRR, No murmurs, No gallops or rubs. Respiratory: has crackles bilaterally GI: Soft, nondistended, nontender, no rebound pain, no organomegaly, BS present. GU: No hematuria Ext: 1+ pitting leg edema bilaterally. 1+DP/PT pulse bilaterally. Musculoskeletal: No joint deformities, No joint redness or warmth, no limitation of ROM in spin. Skin: No rashes.  Neuro: Alert, oriented X3, cranial nerves II-XII grossly intact, moves all extremities normally.  Psych: Patient is not psychotic, no suicidal or hemocidal ideation.  Labs on Admission: I have personally reviewed following labs and imaging studies  CBC: Recent Labs  Lab 11/08/23 2317  WBC 13.6*  NEUTROABS 6.4  HGB 10.3*  HCT 33.2*  MCV 99.1  PLT 392   Basic Metabolic Panel: Recent Labs  Lab 11/08/23 2317  NA 139  K 4.9  CL 105  CO2 18*  GLUCOSE 162*  BUN 79*  CREATININE 12.57*  CALCIUM 8.5*   GFR: CrCl cannot be calculated (Unknown ideal weight.). Liver Function Tests: Recent Labs  Lab 11/08/23 2317  AST 23  ALT 11  ALKPHOS 73  BILITOT 0.6  PROT 7.8  ALBUMIN 4.3   No results for input(s): LIPASE, AMYLASE in the last 168 hours. No results for input(s): AMMONIA in the last 168 hours. Coagulation Profile: No  results for input(s): INR, PROTIME in the last 168 hours. Cardiac Enzymes: No results for input(s): CKTOTAL, CKMB, CKMBINDEX, TROPONINI in the last 168  hours. BNP (last 3 results) No results for input(s): PROBNP in the last 8760 hours. HbA1C: No results for input(s): HGBA1C in the last 72 hours. CBG: No results for input(s): GLUCAP in the last 168 hours. Lipid Profile: No results for input(s): CHOL, HDL, LDLCALC, TRIG, CHOLHDL, LDLDIRECT in the last 72 hours. Thyroid Function Tests: No results for input(s): TSH, T4TOTAL, FREET4, T3FREE, THYROIDAB in the last 72 hours. Anemia Panel: No results for input(s): VITAMINB12, FOLATE, FERRITIN, TIBC, IRON, RETICCTPCT in the last 72 hours. Urine analysis:    Component Value Date/Time   COLORURINE YELLOW (A) 09/18/2019 1854   APPEARANCEUR HAZY (A) 09/18/2019 1854   LABSPEC 1.011 09/18/2019 1854   PHURINE 7.0 09/18/2019 1854   GLUCOSEU NEGATIVE 09/18/2019 1854   HGBUR NEGATIVE 09/18/2019 1854   BILIRUBINUR NEGATIVE 09/18/2019 1854   KETONESUR NEGATIVE 09/18/2019 1854   PROTEINUR 100 (A) 09/18/2019 1854   NITRITE NEGATIVE 09/18/2019 1854   LEUKOCYTESUR SMALL (A) 09/18/2019 1854   Sepsis Labs: @LABRCNTIP (procalcitonin:4,lacticidven:4) ) Recent Results (from the past 240 hours)  Resp panel by RT-PCR (RSV, Flu A&B, Covid) Anterior Nasal Swab     Status: None   Collection Time: 11/08/23 11:17 PM   Specimen: Anterior Nasal Swab  Result Value Ref Range Status   SARS Coronavirus 2 by RT PCR NEGATIVE NEGATIVE Final    Comment: (NOTE) SARS-CoV-2 target nucleic acids are NOT DETECTED.  The SARS-CoV-2 RNA is generally detectable in upper respiratory specimens during the acute phase of infection. The lowest concentration of SARS-CoV-2 viral copies this assay can detect is 138 copies/mL. A negative result does not preclude SARS-Cov-2 infection and should not be used as the sole basis for  treatment or other patient management decisions. A negative result may occur with  improper specimen collection/handling, submission of specimen other than nasopharyngeal swab, presence of viral mutation(s) within the areas targeted by this assay, and inadequate number of viral copies(<138 copies/mL). A negative result must be combined with clinical observations, patient history, and epidemiological information. The expected result is Negative.  Fact Sheet for Patients:  BloggerCourse.com  Fact Sheet for Healthcare Providers:  SeriousBroker.it  This test is no t yet approved or cleared by the [place]  FDA and  has been authorized for detection and/or diagnosis of SARS-CoV-2 by FDA under an Emergency Use Authorization (EUA). This EUA will remain  in effect (meaning this test can be used) for the duration of the COVID-19 declaration under Section 564(b)(1) of the Act, 21 U.S.C.section 360bbb-3(b)(1), unless the authorization is terminated  or revoked sooner.       Influenza A by PCR NEGATIVE NEGATIVE Final   Influenza B by PCR NEGATIVE NEGATIVE Final    Comment: (NOTE) The Xpert Xpress SARS-CoV-2/FLU/RSV plus assay is intended as an aid in the diagnosis of influenza from Nasopharyngeal swab specimens and should not be used as a sole basis for treatment. Nasal washings and aspirates are unacceptable for Xpert Xpress SARS-CoV-2/FLU/RSV testing.  Fact Sheet for Patients: BloggerCourse.com  Fact Sheet for Healthcare Providers: SeriousBroker.it  This test is not yet approved or cleared by the [place]  FDA and has been authorized for detection and/or diagnosis of SARS-CoV-2 by FDA under an Emergency Use Authorization (EUA). This EUA will remain in effect (meaning this test can be used) for the duration of the COVID-19 declaration under Section 564(b)(1) of the Act, 21  U.S.C. section 360bbb-3(b)(1), unless the authorization is terminated or revoked.     Resp Syncytial Virus by PCR NEGATIVE NEGATIVE Final  Comment: (NOTE) Fact Sheet for Patients: BloggerCourse.com  Fact Sheet for Healthcare Providers: SeriousBroker.it  This test is not yet approved or cleared by the [place]  FDA and has been authorized for detection and/or diagnosis of SARS-CoV-2 by FDA under an Emergency Use Authorization (EUA). This EUA will remain in effect (meaning this test can be used) for the duration of the COVID-19 declaration under Section 564(b)(1) of the Act, 21 U.S.C. section 360bbb-3(b)(1), unless the authorization is terminated or revoked.  Performed at [facility], [address], [place], [place] [address]      Radiological Exams on Admission:   Assessment/Plan Principal Problem:   Fluid overload Active Problems:   Acute respiratory failure with hypoxia (HCC)   ESRD on dialysis (HCC)   Hypertensive urgency   Anemia in ESRD (end-stage renal disease) (HCC)   Leukocytosis   Depression with anxiety   Assessment and Plan:   Acute respiratory failure with hypoxia due to fluid overload in the setting of ESRD:  - Place in PCU for observation -80 mg Lasix was given in ED - Give another 0.1 mg of nitroglycerin patch - Bronchodilators as needed Mucinex  - Try to wean off BiPAP - Fluid restriction - Consulted Dr. Dennise of renal for dialysis  ESRD on dialysis [facility]): Patient is doing dialysis on Tuesday and Saturday, which may not be enough. -Consulted Dr. Dennise of dialysis  Hypertensive urgency: Initial blood pressure 210/98 --> 130/90 after received 1 dose of nitroglycerin spray -Patient states that she is taking losartan 50 mg daily and Lasix 80 mg daily - Patient states that she stopped taking amlodipine and Coreg  Anemia in ESRD (end-stage renal disease) (HCC): Hemoglobin stable 10.3  (8.6 on 11/01/2023) -Follow-up with CBC  Leukocytosis: WBC 13.6, no fever, no source of infection identified, likely reactive - Follow-up by CBC  Depression with anxiety - Continue home BuSpar and hydroxyzine      DVT ppx: SQ Heparin      Code Status: Full code    Family Communication:    Yes, patient's sister at bed side.     Disposition Plan:  Anticipate discharge back to previous environment  Consults called: Dr. Dennise for renal  Admission status and Level of care: Progressive:    for obs     Dispo: The patient is from: Home              Anticipated d/c is to: Home              Anticipated d/c date is: 1 day              Patient currently is not medically stable to d/c.    Severity of Illness:  The appropriate patient status for this patient is OBSERVATION. Observation status is judged to be reasonable and necessary in order to provide the required intensity of service to ensure the patient's safety. The patient's presenting symptoms, physical exam findings, and initial radiographic and laboratory data in the context of their medical condition is felt to place them at decreased risk for further clinical deterioration. Furthermore, it is anticipated that the patient will be medically stable for discharge from the hospital within 2 midnights of admission.        Date of Service 11/09/2023    Caleb Exon [facility]   If 7PM-7AM, please contact night-coverage [email or web address] 11/09/2023, 1:51 AM

## 2023-11-09 NOTE — Progress Notes (Signed)
 PROGRESS NOTE    Jessica Patterson   FMW:[id number] DOB: 1961/01/24  DOA: 11/08/2023 Date of Service: 11/09/23 which is hospital day 0  PCP: Ricard Tawni KIDD, MD    Hospital course / significant events:   HPI:  Jessica Patterson is a 63 y.o. 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 Started 10/03 morning, progressively worsening.  She denies cough, chest pain, fever or chills.  SpO2 74% on room air, initially started on NRB, could not tolerate CPAP by EMS.  Her BP was 210/98, down to 138/90 after one dose of nitroglycerin spray by EMS.   10/03: to ED. Chest x-ray showed cardiomegaly and extensive pulmonary edema.  10/03-10/04 overnight: Patient admitted to hospitalists.  Consulted nephro for dialysis.  10/04: overnight/early am - hyperkalemia, given insulin/D50 + lokelma, no chest pain, troponin 208 --> 194. BNP 3000+. Reviewed chart, recent echo 09/19 EF 40-45 w/ grade 2 diast df, mild/mod MR, mild pulm HTN, small circumferential pericardial effusion      Consultants:  Nephrology   Procedures/Surgeries: none      ASSESSMENT & PLAN:   Acute respiratory failure with hypoxia due to fluid overload / question CHF uncertain type in the setting of ESRD:  recent echo 09/19 EF 40-45 w/ grade 2 diast df, mild/mod MR, mild pulm HTN, small circumferential pericardial effusion  Treat underlying cause(s) as below re ESRD and CHF Bronchodilators as needed Mucinex  wean off BiPAP as able --> now on [place], continue supplemental O2    HFrEF and diastolic dysfunction recent echo 09/19 EF 40-45 w/ grade 2 diast df, mild/mod MR, mild pulm HTN, small circumferential pericardial effusion  80 mg Lasix was given in ED, continue diuresis as needed but dialysis will manage fluid status for the most part  Fluid restriction  Pericardial effusion, small Per cardiology notes, planning repeat  echo 12/2023 to [facility]  Repeat echo sooner if needed for chest pain, worsening SOB  ESRD on dialysis Patient is doing dialysis on Tuesday and Saturday, which may not be enough. Nephrology to follow    Hypertensive urgency Initial blood pressure 210/98 --> 130/90 after received 1 dose of nitroglycerin spray Home meds losartan 50 mg daily and Lasix 80 mg daily, states that she stopped taking amlodipine and Coreg On review cardiology notes - amlodipine was dc, startedon losartan, plan to start on bisoprolol Resume antihypertensive regimen w/ losartan IV labetalol prn  0.1 mg of nitroglycerin patch --> removed at pt's request, she attributes headache to this medication   Hyperkalemia in setting of renal failure  Monitor BMP Correct w/ dialysis    Anemia in ESRD (end-stage renal disease)  Hemoglobin stable 10.3 (8.6 on 11/01/2023) Follow CBC   Leukocytosis WBC 13.6, no fever, no source of infection identified, likely reactive Follow CBC   Depression with anxiety Continue home BuSpar and hydroxyzine        No concerns based on BMI: Body mass index is 23.72 kg/m.[name] Significantly low or high BMI is associated with higher medical risk.  Underweight - under 18  overweight - 25 to 29 obese - 30 or more Class 1 obesity: BMI of 30.0 to 34 Class 2 obesity: BMI of 35.0 to 39 Class 3 obesity: BMI of 40.0 to 49 Super Morbid Obesity: BMI 50-59 Super-super Morbid Obesity: BMI 60+ Healthy nutrition and physical activity advised as adjunct to other disease management and risk reduction treatments  DVT prophylaxis: heparin  IV fluids: no continuous IV fluids  Nutrition: renal / fluid restricted Central lines / other devices: none  Code Status: FULL CODE ACP documentation reviewed:  none on file in [facility]  TOC needs: TBD Medical barriers to dispo: O2 requirement, . Expected medical readiness for discharge next 1-2 days.              Subjective / Brief ROS:  Exmained in  dialysis suite this morning Patient reports breathing is improved (she is on [place] O2) Denies CP/SOB.  Pain controlled.  Denies new weakness.  Tolerating diet.  Reports no concerns w/ urination/defecation.   Family Communication: none at this time     Objective Findings:  Vitals:   11/09/23 1340 11/09/23 1345 11/09/23 1400 11/09/23 1452  BP: (!) 144/86 (!) 165/73 (!) 148/74 (!) 155/79  Pulse: 83 79 81 75  Resp: 15 16 17 16   Temp:   98.5 F (36.9 C) 98.4 F (36.9 C)  TempSrc:   Oral   SpO2: 96% 96% 96% 93%  Weight:   52.5 kg     Intake/Output Summary (Last 24 hours) at 11/09/2023 1536 Last data filed at 11/09/2023 1400 Gross per 24 hour  Intake 304.63 ml  Output 2200 ml  Net -1895.37 ml   Filed Weights   11/09/23 0500 11/09/23 0901 11/09/23 1400  Weight: 55.1 kg 54.9 kg 52.5 kg    Examination:  Physical Exam Constitutional:      General: She is not in acute distress. Cardiovascular:     Rate and Rhythm: Normal rate and regular rhythm.  Pulmonary:     Effort: Pulmonary effort is normal. No respiratory distress.     Breath sounds: Rales present.  Abdominal:     Palpations: Abdomen is soft.  Musculoskeletal:     Right lower leg: No edema.     Left lower leg: No edema.  Skin:    General: Skin is warm and dry.  Neurological:     Mental Status: She is alert and oriented to person, place, and time.          Scheduled Medications:   busPIRone  5 mg Oral BID   Chlorhexidine  Gluconate Cloth  6 each Topical Q0600   furosemide  80 mg Oral Daily   heparin   5,000 Units Subcutaneous Q8H   losartan  50 mg Oral Daily   magnesium oxide  400 mg Oral Daily   multivitamin  1 tablet Oral Daily   nitroGLYCERIN  0.1 mg Transdermal Once   pantoprazole  40 mg Oral Daily   sodium bicarbonate   650 mg Oral Once per day on Tuesday Saturday    Continuous Infusions:   PRN Medications:  acetaminophen , albuterol , cetirizine, cyclobenzaprine , dextromethorphan-guaiFENesin ,  fluticasone, hydrOXYzine, labetalol, montelukast, ondansetron  (ZOFRAN ) IV, oxyCODONE -acetaminophen   Antimicrobials from admission:  Anti-infectives (From admission, onward)    None           Data Reviewed:  I have personally reviewed the following...  CBC: Recent Labs  Lab 11/08/23 2317 11/09/23 0451  WBC 13.6* 11.5*  NEUTROABS 6.4  --   HGB 10.3* 8.1*  HCT 33.2* 25.7*  MCV 99.1 97.0  PLT 392 251   Basic Metabolic Panel: Recent Labs  Lab 11/08/23 2317 11/09/23 0451  NA 139 137  K 4.9 6.5*  CL 105 104  CO2 18* 17*  GLUCOSE 162* 106*  BUN 79* 80*  CREATININE 12.57* 12.63*  CALCIUM 8.5* 7.6*  MG  --  2.4   GFR: Estimated  Creatinine Clearance: 3.3 mL/min (A) (by C-G formula based on SCr of 12.63 mg/dL (H)). Liver Function Tests: Recent Labs  Lab 11/08/23 2317  AST 23  ALT 11  ALKPHOS 73  BILITOT 0.6  PROT 7.8  ALBUMIN 4.3   No results for input(s): LIPASE, AMYLASE in the last 168 hours. No results for input(s): AMMONIA in the last 168 hours. Coagulation Profile: No results for input(s): INR, PROTIME in the last 168 hours. Cardiac Enzymes: No results for input(s): CKTOTAL, CKMB, CKMBINDEX, TROPONINI in the last 168 hours. BNP (last 3 results) No results for input(s): PROBNP in the last 8760 hours. HbA1C: No results for input(s): HGBA1C in the last 72 hours. CBG: No results for input(s): GLUCAP in the last 168 hours. Lipid Profile: No results for input(s): CHOL, HDL, LDLCALC, TRIG, CHOLHDL, LDLDIRECT in the last 72 hours. Thyroid Function Tests: No results for input(s): TSH, T4TOTAL, FREET4, T3FREE, THYROIDAB in the last 72 hours. Anemia Panel: No results for input(s): VITAMINB12, FOLATE, FERRITIN, TIBC, IRON, RETICCTPCT in the last 72 hours. Most Recent Urinalysis On File:     Component Value Date/Time   COLORURINE YELLOW (A) 09/18/2019 1854   APPEARANCEUR HAZY (A) 09/18/2019 1854    LABSPEC 1.011 09/18/2019 1854   PHURINE 7.0 09/18/2019 1854   GLUCOSEU NEGATIVE 09/18/2019 1854   HGBUR NEGATIVE 09/18/2019 1854   BILIRUBINUR NEGATIVE 09/18/2019 1854   KETONESUR NEGATIVE 09/18/2019 1854   PROTEINUR 100 (A) 09/18/2019 1854   NITRITE NEGATIVE 09/18/2019 1854   LEUKOCYTESUR SMALL (A) 09/18/2019 1854   Sepsis Labs: @LABRCNTIP (procalcitonin:4,lacticidven:4) Microbiology: Recent Results (from the past 240 hours)  Resp panel by RT-PCR (RSV, Flu A&B, Covid) Anterior Nasal Swab     Status: None   Collection Time: 11/08/23 11:17 PM   Specimen: Anterior Nasal Swab  Result Value Ref Range Status   SARS Coronavirus 2 by RT PCR NEGATIVE NEGATIVE Final    Comment: (NOTE) SARS-CoV-2 target nucleic acids are NOT DETECTED.  The SARS-CoV-2 RNA is generally detectable in upper respiratory specimens during the acute phase of infection. The lowest concentration of SARS-CoV-2 viral copies this assay can detect is 138 copies/mL. A negative result does not preclude SARS-Cov-2 infection and should not be used as the sole basis for treatment or other patient management decisions. A negative result may occur with  improper specimen collection/handling, submission of specimen other than nasopharyngeal swab, presence of viral mutation(s) within the areas targeted by this assay, and inadequate number of viral copies(<138 copies/mL). A negative result must be combined with clinical observations, patient history, and epidemiological information. The expected result is Negative.  Fact Sheet for Patients:  BloggerCourse.com  Fact Sheet for Healthcare Providers:  SeriousBroker.it  This test is no t yet approved or cleared by the [place]  FDA and  has been authorized for detection and/or diagnosis of SARS-CoV-2 by FDA under an Emergency Use Authorization (EUA). This EUA will remain  in effect (meaning this test can be used) for the  duration of the COVID-19 declaration under Section 564(b)(1) of the Act, 21 U.S.C.section 360bbb-3(b)(1), unless the authorization is terminated  or revoked sooner.       Influenza A by PCR NEGATIVE NEGATIVE Final   Influenza B by PCR NEGATIVE NEGATIVE Final    Comment: (NOTE) The Xpert Xpress SARS-CoV-2/FLU/RSV plus assay is intended as an aid in the diagnosis of influenza from Nasopharyngeal swab specimens and should not be used as a sole basis for treatment. Nasal washings and aspirates are unacceptable for Xpert Xpress  SARS-CoV-2/FLU/RSV testing.  Fact Sheet for Patients: BloggerCourse.com  Fact Sheet for Healthcare Providers: SeriousBroker.it  This test is not yet approved or cleared by the [place]  FDA and has been authorized for detection and/or diagnosis of SARS-CoV-2 by FDA under an Emergency Use Authorization (EUA). This EUA will remain in effect (meaning this test can be used) for the duration of the COVID-19 declaration under Section 564(b)(1) of the Act, 21 U.S.C. section 360bbb-3(b)(1), unless the authorization is terminated or revoked.     Resp Syncytial Virus by PCR NEGATIVE NEGATIVE Final    Comment: (NOTE) Fact Sheet for Patients: BloggerCourse.com  Fact Sheet for Healthcare Providers: SeriousBroker.it  This test is not yet approved or cleared by the [place]  FDA and has been authorized for detection and/or diagnosis of SARS-CoV-2 by FDA under an Emergency Use Authorization (EUA). This EUA will remain in effect (meaning this test can be used) for the duration of the COVID-19 declaration under Section 564(b)(1) of the Act, 21 U.S.C. section 360bbb-3(b)(1), unless the authorization is terminated or revoked.  Performed at [facility], [address], [place], [place] [address]       Radiology Studies last 3 days: [facility] Chest [facility] 1  View Result Date: 11/08/2023 CLINICAL DATA:  Dyspnea EXAM: PORTABLE CHEST 1 VIEW COMPARISON:  10/14/2023 FINDINGS: Cardiomegaly. Extensive bilateral pulmonary edema or infiltrates compared to prior. No sizable effusion. Aortic atherosclerosis. No pneumothorax IMPRESSION: Cardiomegaly with extensive bilateral pulmonary edema or infiltrates. Electronically Signed   By: Luke Bun M.D.   On: 11/08/2023 23:53         Laneta Blunt, DO [facility] 11/09/2023, 3:36 PM    Dictation software may have been used to generate the above note. Typos may occur and escape review in typed/dictated notes. Please contact Dr Blunt directly for clarity if needed.  Staff may message me via secure chat in Epic  but this may not receive an immediate response,  please page me for urgent matters!  If 7PM-7AM, please contact night coverage [email or web address]

## 2023-11-09 NOTE — Progress Notes (Signed)
  Echocardiogram 2D Echocardiogram has NOT been performed. The patient is off the floor having Dialysis at this time.  Jessica Patterson 11/09/2023, 10:28 AM

## 2023-11-10 DIAGNOSIS — E8770 Fluid overload, unspecified: Secondary | ICD-10-CM | POA: Diagnosis not present

## 2023-11-10 LAB — BASIC METABOLIC PANEL WITH GFR
Anion gap: 13 (ref 5–15)
BUN: 32 mg/dL — ABNORMAL HIGH (ref 8–23)
CO2: 28 mmol/L (ref 22–32)
Calcium: 7.7 mg/dL — ABNORMAL LOW (ref 8.9–10.3)
Chloride: 96 mmol/L — ABNORMAL LOW (ref 98–111)
Creatinine, Ser: 6.37 mg/dL — ABNORMAL HIGH (ref 0.44–1.00)
GFR, Estimated: 7 mL/min — ABNORMAL LOW (ref >60)
Glucose, Bld: 85 mg/dL (ref 70–99)
Potassium: 4.2 mmol/L (ref 3.5–5.1)
Sodium: 137 mmol/L (ref 135–145)

## 2023-11-10 NOTE — Discharge Summary (Signed)
 Physician Discharge Summary   Patient: Jessica Patterson MRN: [id number]  DOB: 01-29-1961   Admit:     Date of Admission: 11/08/2023 Admitted from: home   Discharge: Date of discharge: 11/10/23 Disposition: Home Condition at discharge: good  CODE STATUS: FULL CODE     Discharge Physician: Laneta Blunt, DO [facility]     PCP: Ricard Tawni KIDD, MD  Recommendations for Outpatient Follow-up:  Follow up with PCP Ricard Tawni KIDD, MD in 2-4 weeks hospital follow up    Discharge Instructions     Diet - low sodium heart healthy   Complete by: As directed    Increase activity slowly   Complete by: As directed          Discharge Diagnoses: Principal Problem:   Fluid overload Active Problems:   Acute respiratory failure with hypoxia (HCC)   ESRD on dialysis (HCC)   Hypertensive urgency   Anemia in ESRD (end-stage renal disease) (HCC)   Leukocytosis   Depression with anxiety       Hospital course / significant events:   HPI:  Jessica Patterson is a 63 y.o. 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 Started 10/03 morning, progressively worsening.  She denies cough, chest pain, fever or chills.  SpO2 74% on room air, initially started on NRB, could not tolerate CPAP by EMS.  Her BP was 210/98, down to 138/90 after one dose of nitroglycerin spray by EMS.   10/03: to ED. Chest x-ray showed cardiomegaly and extensive pulmonary edema.  10/03-10/04 overnight: Patient admitted to hospitalists.  Consulted nephro for dialysis.  10/04: overnight/early am - hyperkalemia, given insulin/D50 + lokelma, no chest pain, troponin 208 --> 194. BNP 3000+. Reviewed chart, recent echo 09/19 EF 40-45 w/ grade 2 diast df, mild/mod MR, mild pulm HTN, small circumferential pericardial effusion. Dialyzed today w/ removal 2L and improvement in breathing 10/05:  doing well this morning, stable for dc, nephro to plan on dialysis three times per week going forward instead of 2      Consultants:  Nephrology   Procedures/Surgeries: none      ASSESSMENT & PLAN:   Acute respiratory failure with hypoxia due to fluid overload / question CHF uncertain type in the setting of ESRD: resolved   recent echo 09/19 EF 40-45 w/ grade 2 diast df, mild/mod MR, mild pulm HTN, small circumferential pericardial effusion  Treat underlying cause(s) as below re ESRD and CHF Bronchodilators as needed Mucinex    HFrEF and diastolic dysfunction recent echo 09/19 EF 40-45 w/ grade 2 diast df, mild/mod MR, mild pulm HTN, small circumferential pericardial effusion  80 mg Lasix po daily Fluid restriction Dialysis going forward at three times weekly   Pericardial effusion, small Per cardiology notes, planning repeat echo 12/2023 to [facility]   ESRD on dialysis Patient is doing dialysis on Tuesday and Saturday, which may not be enough. Nephrology to follow  Dialysis going forward at three times weekly    Hypertensive urgency Initial blood pressure 210/98 --> 130/90 after received 1 dose of nitroglycerin spray Home meds losartan 50 mg daily and Lasix 80 mg daily, states that she stopped taking amlodipine and Coreg On review cardiology notes - amlodipine was dc, startedon losartan, plan to start on bisoprolol Resume antihypertensive regimen Follow w/ cardiology   Hyperkalemia in setting of renal failure  Monitor BMP Correct w/ dialysis    Anemia  in ESRD (end-stage renal disease)  Hemoglobin stable 10.3 (8.6 on 11/01/2023) Follow CBC   Leukocytosis WBC 13.6, no fever, no source of infection identified, likely reactive Follow CBC   Depression with anxiety Continue home BuSpar and hydroxyzine        No concerns based on BMI: Body mass index is 23.72 kg/m.Jessica Patterson Significantly low or high BMI is associated with higher medical risk.  Underweight - under 18   overweight - 25 to 29 obese - 30 or more Class 1 obesity: BMI of 30.0 to 34 Class 2 obesity: BMI of 35.0 to 39 Class 3 obesity: BMI of 40.0 to 49 Super Morbid Obesity: BMI 50-59 Super-super Morbid Obesity: BMI 60+ Healthy nutrition and physical activity advised as adjunct to other disease management and risk reduction treatments             Discharge Instructions  Allergies as of 11/10/2023       Reactions   Amlodipine Shortness Of Breath, Other (See Comments)   Cephalosporins Shortness Of Breath, Nausea And Vomiting   Has tolerated 2nd (CEFOXITIN) and 3rd (CEFPODOXIME) generation cephalosporins in the past without documented ADRs.    Codeine Shortness Of Breath, Nausea And Vomiting   Hydrochlorothiazide Shortness Of Breath, Other (See Comments)   Hydromorphone Hives, Nausea And Vomiting, Other (See Comments)   Other Reaction(s): GI Intolerance   Latex Itching, Rash   Other reaction(s): Itching Other reaction(s): Itching    Other reaction(s): Itching Other reaction(s): Itching   Losartan Potassium Shortness Of Breath, Other (See Comments)   Other reaction(s): Shortness Of Breath Other reaction(s): Shortness Of Breath Per pt has been able to take and tolerate   Metoprolol Other (See Comments)   Doxycycline Hives, Rash   Other reaction(s): Hives Other reaction(s): Hives   Lisinopril    Other Reaction(s): cough (is bothersome, but cannot tolerate any other meds - see rest of reactions)   Sucralfate Rash   Cholecalciferol Other (See Comments)   Ciprofloxacin Other (See Comments)   Clonidine Hcl Nausea And Vomiting   Diltiazem    Doxazosin Mesylate Other (See Comments)   Hydralazine     Hydralazine  Hcl Other (See Comments)   Iodine-131 Other (See Comments)   Can not receive due to Kidney disease Can not receive due to Kidney disease   Ivp Dye [iodinated Contrast Media]    Methyldopa Nausea Only   Blisters   Mircera [methoxy Polyethylene Glycol-epoetin  Beta]     Nifedipine    Other Reaction(s): Other (See Comments) CHEST PAIN ACHING   Ranitidine Hcl Other (See Comments)   Sulfa Antibiotics    Cefuroxime Axetil Nausea And Vomiting   Diltiazem Hcl Nausea Only   Other Reaction(s): GI Intolerance   Isosorbide Nausea And Vomiting, Other (See Comments)   Other Reaction(s): GI Intolerance   Morphine Hives, Nausea And Vomiting, Other (See Comments)   Other Reaction(s): GI Intolerance   Penicillin G Rash   welts   Prazosin Other (See Comments)   Headaches        Medication List     STOP taking these medications    amLODipine 10 MG tablet Commonly known as: NORVASC   carvedilol 12.5 MG tablet Commonly known as: COREG       TAKE these medications    acetaminophen  325 MG tablet Commonly known as: TYLENOL  Take 650 mg by mouth every 6 (six) hours as needed for moderate pain (pain score 4-6).   busPIRone 5 MG tablet Commonly known as: BUSPAR Take 5 mg by  mouth 2 (two) times daily.   cetirizine 10 MG tablet Commonly known as: ZYRTEC Take 10 mg by mouth daily as needed.   cyclobenzaprine  10 MG tablet Commonly known as: FLEXERIL  Take 10 mg by mouth at bedtime as needed for muscle spasms.   fluticasone 50 MCG/ACT nasal spray Commonly known as: FLONASE Place 2 sprays into both nostrils daily as needed for allergies or rhinitis.   furosemide 40 MG tablet Commonly known as: LASIX Take 80 mg by mouth daily.   hydrOXYzine 25 MG tablet Commonly known as: ATARAX Take 25 mg by mouth every 6 (six) hours as needed for anxiety.   losartan 50 MG tablet Commonly known as: COZAAR Take 50 mg by mouth daily.   Magnesium Oxide -Mg Supplement 250 MG Tabs Take 1 tablet by mouth daily.   montelukast 10 MG tablet Commonly known as: SINGULAIR Take 10 mg by mouth daily as needed (allergies).   multivitamin Tabs tablet Take 1 tablet by mouth daily.   omeprazole 40 MG capsule Commonly known as: PRILOSEC Take 40 mg by mouth daily.    ondansetron  4 MG disintegrating tablet Commonly known as: ZOFRAN -ODT Take 4 mg by mouth every 8 (eight) hours as needed.   oxyCODONE -acetaminophen  5-325 MG tablet Commonly known as: Percocet Take 1 tablet by mouth every 6 (six) hours as needed for severe pain (pain score 7-10) or moderate pain (pain score 4-6).   RETACRIT  IJ Inject as directed. Monthly injection at cancer center   sodium bicarbonate  650 MG tablet Take 650 mg by mouth 2 (two) times a week. Tuesday and Saturday          Allergies  Allergen Reactions   Amlodipine Shortness Of Breath and Other (See Comments)   Cephalosporins Shortness Of Breath and Nausea And Vomiting    Has tolerated 2nd (CEFOXITIN) and 3rd (CEFPODOXIME) generation cephalosporins in the past without documented ADRs.    Codeine Shortness Of Breath and Nausea And Vomiting   Hydrochlorothiazide Shortness Of Breath and Other (See Comments)   Hydromorphone Hives, Nausea And Vomiting and Other (See Comments)    Other Reaction(s): GI Intolerance   Latex Itching and Rash    Other reaction(s): Itching  Other reaction(s): Itching    Other reaction(s): Itching Other reaction(s): Itching   Losartan Potassium Shortness Of Breath and Other (See Comments)    Other reaction(s): Shortness Of Breath Other reaction(s): Shortness Of Breath Per pt has been able to take and tolerate   Metoprolol Other (See Comments)   Doxycycline Hives and Rash    Other reaction(s): Hives Other reaction(s): Hives    Lisinopril     Other Reaction(s): cough (is bothersome, but cannot tolerate any other meds - see rest of reactions)   Sucralfate Rash   Cholecalciferol Other (See Comments)   Ciprofloxacin Other (See Comments)   Clonidine Hcl Nausea And Vomiting   Diltiazem    Doxazosin Mesylate Other (See Comments)   Hydralazine     Hydralazine  Hcl Other (See Comments)   Iodine-131 Other (See Comments)    Can not receive due to Kidney disease Can not receive due to Kidney  disease    Ivp Dye [Iodinated Contrast Media]    Methyldopa Nausea Only    Blisters    Mircera [Methoxy Polyethylene Glycol-Epoetin  Beta]    Nifedipine     Other Reaction(s): Other (See Comments)  CHEST PAIN ACHING   Ranitidine Hcl Other (See Comments)   Sulfa Antibiotics    Cefuroxime Axetil Nausea And Vomiting   Diltiazem  Hcl Nausea Only    Other Reaction(s): GI Intolerance   Isosorbide Nausea And Vomiting and Other (See Comments)    Other Reaction(s): GI Intolerance   Morphine Hives, Nausea And Vomiting and Other (See Comments)    Other Reaction(s): GI Intolerance   Penicillin G Rash    welts    Prazosin Other (See Comments)    Headaches     Subjective: pt feeling better today in terms of breathing, no other concerns right now. Denies CP, no N/V   Discharge Exam: BP (!) 168/85 (BP Location: Right Arm)   Pulse 78   Temp 98.5 F (36.9 C) (Oral)   Resp 17   Ht 5' (1.524 m)   Wt 52.9 kg   SpO2 95%   BMI 22.77 kg/m  General: Pt is alert, awake, not in acute distress Cardiovascular: RRR, S1/S2 +, no rubs, no gallops Respiratory: CTA bilaterally, no wheezing, no rhonchi Abdominal: Soft, NT, ND, bowel sounds + Extremities: no edema, no cyanosis     The results of significant diagnostics from this hospitalization (including imaging, microbiology, ancillary and laboratory) are listed below for reference.     Microbiology: Recent Results (from the past 240 hours)  Resp panel by RT-PCR (RSV, Flu A&B, Covid) Anterior Nasal Swab     Status: None   Collection Time: 11/08/23 11:17 PM   Specimen: Anterior Nasal Swab  Result Value Ref Range Status   SARS Coronavirus 2 by RT PCR NEGATIVE NEGATIVE Final    Comment: (NOTE) SARS-CoV-2 target nucleic acids are NOT DETECTED.  The SARS-CoV-2 RNA is generally detectable in upper respiratory specimens during the acute phase of infection. The lowest concentration of SARS-CoV-2 viral copies this assay can detect is 138  copies/mL. A negative result does not preclude SARS-Cov-2 infection and should not be used as the sole basis for treatment or other patient management decisions. A negative result may occur with  improper specimen collection/handling, submission of specimen other than nasopharyngeal swab, presence of viral mutation(s) within the areas targeted by this assay, and inadequate number of viral copies(<138 copies/mL). A negative result must be combined with clinical observations, patient history, and epidemiological information. The expected result is Negative.  Fact Sheet for Patients:  BloggerCourse.com  Fact Sheet for Healthcare Providers:  SeriousBroker.it  This test is no t yet approved or cleared by the [place]  FDA and  has been authorized for detection and/or diagnosis of SARS-CoV-2 by FDA under an Emergency Use Authorization (EUA). This EUA will remain  in effect (meaning this test can be used) for the duration of the COVID-19 declaration under Section 564(b)(1) of the Act, 21 U.S.C.section 360bbb-3(b)(1), unless the authorization is terminated  or revoked sooner.       Influenza A by PCR NEGATIVE NEGATIVE Final   Influenza B by PCR NEGATIVE NEGATIVE Final    Comment: (NOTE) The Xpert Xpress SARS-CoV-2/FLU/RSV plus assay is intended as an aid in the diagnosis of influenza from Nasopharyngeal swab specimens and should not be used as a sole basis for treatment. Nasal washings and aspirates are unacceptable for Xpert Xpress SARS-CoV-2/FLU/RSV testing.  Fact Sheet for Patients: BloggerCourse.com  Fact Sheet for Healthcare Providers: SeriousBroker.it  This test is not yet approved or cleared by the [place]  FDA and has been authorized for detection and/or diagnosis of SARS-CoV-2 by FDA under an Emergency Use Authorization (EUA). This EUA will remain in effect (meaning  this test can be used) for the duration of the COVID-19 declaration under Section 564(b)(1)  of the Act, 21 U.S.C. section 360bbb-3(b)(1), unless the authorization is terminated or revoked.     Resp Syncytial Virus by PCR NEGATIVE NEGATIVE Final    Comment: (NOTE) Fact Sheet for Patients: BloggerCourse.com  Fact Sheet for Healthcare Providers: SeriousBroker.it  This test is not yet approved or cleared by the [place]  FDA and has been authorized for detection and/or diagnosis of SARS-CoV-2 by FDA under an Emergency Use Authorization (EUA). This EUA will remain in effect (meaning this test can be used) for the duration of the COVID-19 declaration under Section 564(b)(1) of the Act, 21 U.S.C. section 360bbb-3(b)(1), unless the authorization is terminated or revoked.  Performed at [facility], [address] [place], [place] [address]      Labs: BNP (last 3 results) Recent Labs    11/08/23 2317  BNP 3,044.7*   Basic Metabolic Panel: Recent Labs  Lab 11/08/23 2317 11/09/23 0451 11/10/23 0316  NA 139 137 137  K 4.9 6.5* 4.2  CL 105 104 96*  CO2 18* 17* 28  GLUCOSE 162* 106* 85  BUN 79* 80* 32*  CREATININE 12.57* 12.63* 6.37*  CALCIUM 8.5* 7.6* 7.7*  MG  --  2.4  --    Liver Function Tests: Recent Labs  Lab 11/08/23 2317  AST 23  ALT 11  ALKPHOS 73  BILITOT 0.6  PROT 7.8  ALBUMIN 4.3   No results for input(s): LIPASE, AMYLASE in the last 168 hours. No results for input(s): AMMONIA in the last 168 hours. CBC: Recent Labs  Lab 11/08/23 2317 11/09/23 0451  WBC 13.6* 11.5*  NEUTROABS 6.4  --   HGB 10.3* 8.1*  HCT 33.2* 25.7*  MCV 99.1 97.0  PLT 392 251   Cardiac Enzymes: No results for input(s): CKTOTAL, CKMB, CKMBINDEX, TROPONINI in the last 168 hours. BNP: Invalid input(s): POCBNP CBG: No results for input(s): GLUCAP in the last 168 hours. D-Dimer No results for  input(s): DDIMER in the last 72 hours. Hgb A1c No results for input(s): HGBA1C in the last 72 hours. Lipid Profile No results for input(s): CHOL, HDL, LDLCALC, TRIG, CHOLHDL, LDLDIRECT in the last 72 hours. Thyroid function studies No results for input(s): TSH, T4TOTAL, T3FREE, THYROIDAB in the last 72 hours.  Invalid input(s): FREET3 Anemia work up No results for input(s): VITAMINB12, FOLATE, FERRITIN, TIBC, IRON, RETICCTPCT in the last 72 hours. Urinalysis    Component Value Date/Time   COLORURINE YELLOW (A) 09/18/2019 1854   APPEARANCEUR HAZY (A) 09/18/2019 1854   LABSPEC 1.011 09/18/2019 1854   PHURINE 7.0 09/18/2019 1854   GLUCOSEU NEGATIVE 09/18/2019 1854   HGBUR NEGATIVE 09/18/2019 1854   BILIRUBINUR NEGATIVE 09/18/2019 1854   KETONESUR NEGATIVE 09/18/2019 1854   PROTEINUR 100 (A) 09/18/2019 1854   NITRITE NEGATIVE 09/18/2019 1854   LEUKOCYTESUR SMALL (A) 09/18/2019 1854   Sepsis Labs Recent Labs  Lab 11/08/23 2317 11/09/23 0451  WBC 13.6* 11.5*   Microbiology Recent Results (from the past 240 hours)  Resp panel by RT-PCR (RSV, Flu A&B, Covid) Anterior Nasal Swab     Status: None   Collection Time: 11/08/23 11:17 PM   Specimen: Anterior Nasal Swab  Result Value Ref Range Status   SARS Coronavirus 2 by RT PCR NEGATIVE NEGATIVE Final    Comment: (NOTE) SARS-CoV-2 target nucleic acids are NOT DETECTED.  The SARS-CoV-2 RNA is generally detectable in upper respiratory specimens during the acute phase of infection. The lowest concentration of SARS-CoV-2 viral copies this assay can detect is 138 copies/mL.  A negative result does not preclude SARS-Cov-2 infection and should not be used as the sole basis for treatment or other patient management decisions. A negative result may occur with  improper specimen collection/handling, submission of specimen other than nasopharyngeal swab, presence of viral mutation(s) within the areas  targeted by this assay, and inadequate number of viral copies(<138 copies/mL). A negative result must be combined with clinical observations, patient history, and epidemiological information. The expected result is Negative.  Fact Sheet for Patients:  BloggerCourse.com  Fact Sheet for Healthcare Providers:  SeriousBroker.it  This test is no t yet approved or cleared by the [place]  FDA and  has been authorized for detection and/or diagnosis of SARS-CoV-2 by FDA under an Emergency Use Authorization (EUA). This EUA will remain  in effect (meaning this test can be used) for the duration of the COVID-19 declaration under Section 564(b)(1) of the Act, 21 U.S.C.section 360bbb-3(b)(1), unless the authorization is terminated  or revoked sooner.       Influenza A by PCR NEGATIVE NEGATIVE Final   Influenza B by PCR NEGATIVE NEGATIVE Final    Comment: (NOTE) The Xpert Xpress SARS-CoV-2/FLU/RSV plus assay is intended as an aid in the diagnosis of influenza from Nasopharyngeal swab specimens and should not be used as a sole basis for treatment. Nasal washings and aspirates are unacceptable for Xpert Xpress SARS-CoV-2/FLU/RSV testing.  Fact Sheet for Patients: BloggerCourse.com  Fact Sheet for Healthcare Providers: SeriousBroker.it  This test is not yet approved or cleared by the [place]  FDA and has been authorized for detection and/or diagnosis of SARS-CoV-2 by FDA under an Emergency Use Authorization (EUA). This EUA will remain in effect (meaning this test can be used) for the duration of the COVID-19 declaration under Section 564(b)(1) of the Act, 21 U.S.C. section 360bbb-3(b)(1), unless the authorization is terminated or revoked.     Resp Syncytial Virus by PCR NEGATIVE NEGATIVE Final    Comment: (NOTE) Fact Sheet for  Patients: BloggerCourse.com  Fact Sheet for Healthcare Providers: SeriousBroker.it  This test is not yet approved or cleared by the [place]  FDA and has been authorized for detection and/or diagnosis of SARS-CoV-2 by FDA under an Emergency Use Authorization (EUA). This EUA will remain in effect (meaning this test can be used) for the duration of the COVID-19 declaration under Section 564(b)(1) of the Act, 21 U.S.C. section 360bbb-3(b)(1), unless the authorization is terminated or revoked.  Performed at [facility], [address] [place], [place] [address]    Imaging No results found.    Time coordinating discharge: over 30 minutes  SIGNED:  Darius Fillingim DO [facility]

## 2023-11-10 NOTE — Progress Notes (Addendum)
 [facility] [place], [place] 11/10/23  Subjective:   Hospital day # 1  Patient known to our practice from outpatient dialysis last dialysis treatment was on September 30.  He presented by EMS for severe shortness of breath which was acute onset while sleeping.  She was found to be 75% oxygen saturation on room air.  Blood pressure was elevated at greater than 210.  She required nonrebreather and BiPAP.  She was then admitted to hospitalist team for further management. During the workup she was noted to have elevated potassium 6.5.  He was treated with IV insulin D50 and Lokelma.  Update: Patient seen sitting at bedside States she feels better today Denies headache Multiple questions pertaining to dialysis, hypertension and diet    Objective:  Vital signs in last 24 hours:  Temp:  [98.1 F (36.7 C)-98.9 F (37.2 C)] 98.5 F (36.9 C) (10/05 0814) Pulse Rate:  [67-121] 78 (10/05 0820) Resp:  [15-20] 17 (10/05 0401) BP: (140-178)/(71-94) 168/85 (10/05 0814) SpO2:  [93 %-100 %] 95 % (10/05 0820) Weight:  [52.5 kg-52.9 kg] 52.9 kg (10/05 0654)  Weight change: -0.2 kg Filed Weights   11/09/23 0901 11/09/23 1400 11/10/23 0654  Weight: 54.9 kg 52.5 kg 52.9 kg    Intake/Output:    Intake/Output Summary (Last 24 hours) at 11/10/2023 1154 Last data filed at 11/10/2023 1023 Gross per 24 hour  Intake 360 ml  Output 2250 ml  Net -1890 ml     Physical Exam: General: No acute distress, laying in the bed  HEENT Moist oral mucous membranes  Pulm/lungs [place] O2, normal breathing effort, coarse breath sounds  CVS/Heart Regular rhythm  Abdomen:  Nontender nondistended  Extremities: No significant edema  Neurologic: Alert, oriented  Skin: No acute rashes  Access: Left upper extremity AV graft       Basic Metabolic Panel:  Recent Labs  Lab 11/08/23 2317 11/09/23 0451 11/10/23 0316  NA 139 137 137  K 4.9 6.5* 4.2  CL 105 104 96*  CO2 18* 17* 28  GLUCOSE  162* 106* 85  BUN 79* 80* 32*  CREATININE 12.57* 12.63* 6.37*  CALCIUM 8.5* 7.6* 7.7*  MG  --  2.4  --      CBC: Recent Labs  Lab 11/08/23 2317 11/09/23 0451  WBC 13.6* 11.5*  NEUTROABS 6.4  --   HGB 10.3* 8.1*  HCT 33.2* 25.7*  MCV 99.1 97.0  PLT 392 251      Lab Results  Component Value Date   HEPBSAG NON REACTIVE 11/09/2023      Microbiology:  Recent Results (from the past 240 hours)  Resp panel by RT-PCR (RSV, Flu A&B, Covid) Anterior Nasal Swab     Status: None   Collection Time: 11/08/23 11:17 PM   Specimen: Anterior Nasal Swab  Result Value Ref Range Status   SARS Coronavirus 2 by RT PCR NEGATIVE NEGATIVE Final    Comment: (NOTE) SARS-CoV-2 target nucleic acids are NOT DETECTED.  The SARS-CoV-2 RNA is generally detectable in upper respiratory specimens during the acute phase of infection. The lowest concentration of SARS-CoV-2 viral copies this assay can detect is 138 copies/mL. A negative result does not preclude SARS-Cov-2 infection and should not be used as the sole basis for treatment or other patient management decisions. A negative result may occur with  improper specimen collection/handling, submission of specimen other than nasopharyngeal swab, presence of viral mutation(s) within the areas targeted by this assay, and inadequate number of viral copies(<138  copies/mL). A negative result must be combined with clinical observations, patient history, and epidemiological information. The expected result is Negative.  Fact Sheet for Patients:  BloggerCourse.com  Fact Sheet for Healthcare Providers:  SeriousBroker.it  This test is no t yet approved or cleared by the [place]  FDA and  has been authorized for detection and/or diagnosis of SARS-CoV-2 by FDA under an Emergency Use Authorization (EUA). This EUA will remain  in effect (meaning this test can be used) for the duration of the COVID-19  declaration under Section 564(b)(1) of the Act, 21 U.S.C.section 360bbb-3(b)(1), unless the authorization is terminated  or revoked sooner.       Influenza A by PCR NEGATIVE NEGATIVE Final   Influenza B by PCR NEGATIVE NEGATIVE Final    Comment: (NOTE) The Xpert Xpress SARS-CoV-2/FLU/RSV plus assay is intended as an aid in the diagnosis of influenza from Nasopharyngeal swab specimens and should not be used as a sole basis for treatment. Nasal washings and aspirates are unacceptable for Xpert Xpress SARS-CoV-2/FLU/RSV testing.  Fact Sheet for Patients: BloggerCourse.com  Fact Sheet for Healthcare Providers: SeriousBroker.it  This test is not yet approved or cleared by the [place]  FDA and has been authorized for detection and/or diagnosis of SARS-CoV-2 by FDA under an Emergency Use Authorization (EUA). This EUA will remain in effect (meaning this test can be used) for the duration of the COVID-19 declaration under Section 564(b)(1) of the Act, 21 U.S.C. section 360bbb-3(b)(1), unless the authorization is terminated or revoked.     Resp Syncytial Virus by PCR NEGATIVE NEGATIVE Final    Comment: (NOTE) Fact Sheet for Patients: BloggerCourse.com  Fact Sheet for Healthcare Providers: SeriousBroker.it  This test is not yet approved or cleared by the [place]  FDA and has been authorized for detection and/or diagnosis of SARS-CoV-2 by FDA under an Emergency Use Authorization (EUA). This EUA will remain in effect (meaning this test can be used) for the duration of the COVID-19 declaration under Section 564(b)(1) of the Act, 21 U.S.C. section 360bbb-3(b)(1), unless the authorization is terminated or revoked.  Performed at [facility], [address] [place], [place] [address]     Coagulation Studies: No results for input(s): LABPROT, INR in the last 72  hours.  Urinalysis: No results for input(s): COLORURINE, LABSPEC, PHURINE, GLUCOSEU, HGBUR, BILIRUBINUR, KETONESUR, PROTEINUR, UROBILINOGEN, NITRITE, LEUKOCYTESUR in the last 72 hours.  Invalid input(s): APPERANCEUR    Imaging: DG Chest Port 1 View Result Date: 11/08/2023 CLINICAL DATA:  Dyspnea EXAM: PORTABLE CHEST 1 VIEW COMPARISON:  10/14/2023 FINDINGS: Cardiomegaly. Extensive bilateral pulmonary edema or infiltrates compared to prior. No sizable effusion. Aortic atherosclerosis. No pneumothorax IMPRESSION: Cardiomegaly with extensive bilateral pulmonary edema or infiltrates. Electronically Signed   By: Luke Bun M.D.   On: 11/08/2023 23:53     Medications:     busPIRone  5 mg Oral BID   Chlorhexidine  Gluconate Cloth  6 each Topical Q0600   furosemide  80 mg Oral Daily   heparin   5,000 Units Subcutaneous Q8H   losartan  50 mg Oral Daily   magnesium oxide  400 mg Oral Daily   multivitamin  1 tablet Oral Daily   pantoprazole  40 mg Oral Daily   sodium bicarbonate   650 mg Oral Once per day on Tuesday Saturday   acetaminophen , albuterol , cetirizine, cyclobenzaprine , dextromethorphan-guaiFENesin , fluticasone, hydrOXYzine, labetalol, montelukast, ondansetron  (ZOFRAN ) IV, oxyCODONE -acetaminophen   Assessment/ Plan:  63 y.o. female with end-stage renal disease, severe hypertension, heart failure with reduced ejection fraction  admitted on 11/08/2023 for Flash pulmonary edema (HCC) [J81.0] Acute respiratory failure with hypoxia (HCC) [J96.01] Fluid overload [E87.70] Chronic kidney disease with end stage renal failure on dialysis (HCC) [N18.6, Z99.2]   [facility]/53.5 kg/TTS/left upper extremity AV graft  Severe hyperkalemia Managed with IV insulin/D50, Lokelma orally. In Ed on arrival. Further managed with dialysis, 4.2  End-stage renal disease, with shortness of breath Likely caused by severe hypertension which is uncontrolled. Patient was  recently evaluated by cardiologist as outpatient on 11/06/2023.  Amlodipine was discontinued and patient was started on losartan 50 mg once a day.  She was also to be started on bisoprolol once daily in 2 weeks.  A repeat 2D echo was planned to evaluate pericardial effusion.  Patient received dialysis yesterday, UF 2L achieved. Discussed the need to transition to 3 treatments with patient to improve hypertension and fluid control. Will inform outpatient clinic of this and suggested dry weight of 53.0kg. Next treatment scheduled for Tuesday.   Encouraged to maintain fluid restriction of 30z or less.   Hypertension Continue losartan at current dose. And encouraged to start bisoprolol nightly  Anemia in chronic kidney disease Denies signs of bleeding. Hgb 8.1 at last check. Will continue to monitor.   Anxiety Patient mentioned that she was not taking buspirone on a regular basis.  Encouraged her to take this regularly so that anxiety does not exacerbate her blood pressure.     LOS: 1 Jessica Patterson 10/5/202511:54 AM  [facility] [place], [place] [phone]  Patient was seen and examined with Jessica Harris, NP.  Plan of care was formulated for the problems addressed and discussed with NP.  I agree with the note as documented except as noted below.

## 2023-11-11 LAB — HEPATITIS B SURFACE ANTIBODY, QUANTITATIVE: Hep B S AB Quant (Post): 46.4 m[IU]/mL

## 2023-11-14 ENCOUNTER — Other Ambulatory Visit: Payer: Self-pay

## 2023-11-14 DIAGNOSIS — D631 Anemia in chronic kidney disease: Secondary | ICD-10-CM

## 2023-11-15 ENCOUNTER — Inpatient Hospital Stay (HOSPITAL_BASED_OUTPATIENT_CLINIC_OR_DEPARTMENT_OTHER): Admitting: Oncology

## 2023-11-15 ENCOUNTER — Inpatient Hospital Stay: Attending: Oncology

## 2023-11-15 ENCOUNTER — Encounter: Payer: Self-pay | Admitting: Oncology

## 2023-11-15 ENCOUNTER — Inpatient Hospital Stay

## 2023-11-15 VITALS — BP 177/91 | HR 77 | Temp 99.2°F | Resp 18 | Wt 119.6 lb

## 2023-11-15 DIAGNOSIS — I120 Hypertensive chronic kidney disease with stage 5 chronic kidney disease or end stage renal disease: Secondary | ICD-10-CM | POA: Diagnosis present

## 2023-11-15 DIAGNOSIS — N186 End stage renal disease: Secondary | ICD-10-CM | POA: Insufficient documentation

## 2023-11-15 DIAGNOSIS — D631 Anemia in chronic kidney disease: Secondary | ICD-10-CM

## 2023-11-15 DIAGNOSIS — Z992 Dependence on renal dialysis: Secondary | ICD-10-CM | POA: Insufficient documentation

## 2023-11-15 LAB — CBC WITH DIFFERENTIAL (CANCER CENTER ONLY)
Abs Immature Granulocytes: 0.02 K/uL (ref 0.00–0.07)
Basophils Absolute: 0.1 K/uL (ref 0.0–0.1)
Basophils Relative: 1 %
Eosinophils Absolute: 0.7 K/uL — ABNORMAL HIGH (ref 0.0–0.5)
Eosinophils Relative: 11 %
HCT: 28.2 % — ABNORMAL LOW (ref 36.0–46.0)
Hemoglobin: 9 g/dL — ABNORMAL LOW (ref 12.0–15.0)
Immature Granulocytes: 0 %
Lymphocytes Relative: 23 %
Lymphs Abs: 1.6 K/uL (ref 0.7–4.0)
MCH: 30.8 pg (ref 26.0–34.0)
MCHC: 31.9 g/dL (ref 30.0–36.0)
MCV: 96.6 fL (ref 80.0–100.0)
Monocytes Absolute: 0.6 K/uL (ref 0.1–1.0)
Monocytes Relative: 9 %
Neutro Abs: 3.7 K/uL (ref 1.7–7.7)
Neutrophils Relative %: 56 %
Platelet Count: 185 K/uL (ref 150–400)
RBC: 2.92 MIL/uL — ABNORMAL LOW (ref 3.87–5.11)
RDW: 16.1 % — ABNORMAL HIGH (ref 11.5–15.5)
WBC Count: 6.8 K/uL (ref 4.0–10.5)
nRBC: 0 % (ref 0.0–0.2)

## 2023-11-15 MED ORDER — EPOETIN ALFA-EPBX 40000 UNIT/ML IJ SOLN
40000.0000 [IU] | Freq: Once | INTRAMUSCULAR | Status: AC
  Administered 2023-11-15: 40000 [IU] via SUBCUTANEOUS
  Filled 2023-11-15: qty 1

## 2023-11-15 NOTE — Assessment & Plan Note (Signed)
 Labs are reviewed and discussed with patient. Lab Results  Component Value Date   HGB 9.0 (L) 11/15/2023   TIBC 272 11/01/2023   IRONPCTSAT 23 11/01/2023   FERRITIN 344 (H) 11/01/2023    -She tolerated retacrit  40,000 units with premedication-benadryl  very well.  Hemoglobin remains low.  Recommend Retacrit  40,000 units every 2 weeks if H&H <=10.  She get IV iron treatments with her HD and ferritin is at goal.

## 2023-11-15 NOTE — Patient Instructions (Signed)
 Please take Premeds (benadryl ) at home prior to each injection appointment

## 2023-11-15 NOTE — Progress Notes (Signed)
 Hematology/Oncology Progress note [phone] [phone]           REFERRING PROVIDER: Ricard Tawni KIDD, MD   CHIEF COMPLAINTS/REASON FOR VISIT:  Anemia due to ESRD   ASSESSMENT & PLAN:   Anemia in ESRD (end-stage renal disease) (HCC) Labs are reviewed and discussed with patient. Lab Results  Component Value Date   HGB 9.0 (L) 11/15/2023   TIBC 272 11/01/2023   IRONPCTSAT 23 11/01/2023   FERRITIN 344 (H) 11/01/2023    -She tolerated retacrit  40,000 units with premedication-benadryl  very well.  Hemoglobin remains low.  Recommend Retacrit  40,000 units every 2 weeks if H&H <=10.  She get IV iron treatments with her HD and ferritin is at goal.    Orders Placed This Encounter  Procedures   CBC (Cancer Center Only)    Standing Status:   Future    Expected Date:   02/07/2024    Expiration Date:   05/07/2024   Iron and TIBC    Standing Status:   Future    Expected Date:   02/07/2024    Expiration Date:   05/07/2024   Ferritin    Standing Status:   Future    Expected Date:   02/07/2024    Expiration Date:   05/07/2024   Retic Panel    Standing Status:   Future    Expected Date:   02/07/2024    Expiration Date:   05/07/2024   Hemoglobin and Hematocrit (Cancer Center Only)    Standing Status:   Standing    Number of Occurrences:   5    Expiration Date:   11/14/2024   Follow up  Lab H&H in 2 weeks, 4 weeks, 6 weeks 8 weeks, 10 weeks +/- Retacrit   12 weeks lab MD +/- Retacrit .  Cbc[no diff] iron tibc ferritin retic panel.   All questions were answered. The patient knows to call the clinic with any problems, questions or concerns.  Zelphia Cap, MD, PhD [facility] Hematology Oncology 11/15/2023   HISTORY OF PRESENTING ILLNESS:   Jessica Patterson is a  63 y.o.  female with PMH listed below was seen in consultation at the request of  Ricard Tawni KIDD, MD  for evaluation of anemia due to ESRD She started hemodialysis on February 09, 2023, attending sessions twice  a week. She has received EPO replacement with Mircera and was noted to have adverse reaction. Per patient, the reactions include difficulty breathing and a sensation of 'everything closing up,' occurring both in the hospital and at the [facility] dialysis center. These episodes last five to eight minutes and are relieved with oxygen, but no medications like epinephrine  or steroids were administered.  Recent hospitalization due to worsening kidney function, uncontrolled HTN, and electrolyte imbalance. Was put in the ICU. Started dialysis while in the hospital (x3), and now on schedule of Tuesday/Saturday   She has a long-standing history of medical issues, including being born with exstrophy of bladder, requiring multiple surgeries. She has been under psychiatric care since a young age due to these medical challenges and is currently on Buspar and Atarax for anxiety, which helps her sleep.    INTERVAL HISTORY Jessica Patterson is a 63 y.o. female who has above history reviewed by me today presents for follow up visit for anemia in ESRD.  Denies headache, vision changes, nausea vomiting.  Chronic fatigue unchanged.   MEDICAL HISTORY:  Past Medical History:  Diagnosis Date   Anemia    ESRD  Anxiety    Cervical dysplasia    CKD (chronic kidney disease), stage IV (HCC)    Depression    Exstrophy of bladder    GERD (gastroesophageal reflux disease)    History of kidney stones    Hyperlipidemia    Hypertension    Kidney stone    Pneumonia due to COVID-19 virus    PONV (postoperative nausea and vomiting)    Recurrent UTI    Seasonal allergies    Sepsis (HCC)     SURGICAL HISTORY: Past Surgical History:  Procedure Laterality Date   ABDOMINAL HERNIA REPAIR N/A 07/12/2017   ABDOMINAL HYSTERECTOMY N/A 12/23/2015   Boyce-Vest procedure     age 34   CERVICAL CONE BIOPSY  05/26/2013   COLOSTOMY N/A    COLPOSCOPY  07/19/2015   DIAGNOSTIC LAPAROSCOPY  09/18/2017   abdominal wall mesh excision    DIALYSIS/PERMA CATHETER INSERTION     DIALYSIS/PERMA CATHETER REMOVAL N/A 09/05/2023   Procedure: DIALYSIS/PERMA CATHETER REMOVAL;  Surgeon: Marea Selinda RAMAN, MD;  Location: [facility] INVASIVE CV LAB;  Service: Cardiovascular;  Laterality: N/A;   INSERTION OF ARTERIOVENOUS (AV) ARTEGRAFT ARM Left 05/31/2023   Procedure: INSERTION, GRAFT, ARTERIOVENOUS, UPPER EXTREMITY;  Surgeon: Jama Cordella MATSU, MD;  Location: [facility] ORS;  Service: Vascular;  Laterality: Left;  BRACHIAL AXILLARY   KIDNEY STONE SURGERY     NEPHROSTOMY TUBE PLACEMENT ([facility] HX) N/A    SMALL INTESTINE SURGERY     x3   URETEROSIGMOIDOSTOMY      SOCIAL HISTORY: Social History   Socioeconomic History   Marital status: Single    Spouse name: Not on file   Number of children: Not on file   Years of education: Not on file   Highest education level: Not on file  Occupational History   Not on file  Tobacco Use   Smoking status: Never   Smokeless tobacco: Never  Vaping Use   Vaping status: Never Used  Substance and Sexual Activity   Alcohol use: Never   Drug use: Never   Sexual activity: Not on file  Other Topics Concern   Not on file  Social History Narrative   Not on file   Social Drivers of Health   Financial Resource Strain: Not on file  Food Insecurity: No Food Insecurity (11/09/2023)   Hunger Vital Sign    Worried About Running Out of Food in the Last Year: Never true    Ran Out of Food in the Last Year: Never true  Transportation Needs: No Transportation Needs (11/09/2023)   PRAPARE - Administrator, Civil Service (Medical): No    Lack of Transportation (Non-Medical): No  Physical Activity: Not on file  Stress: Not on file  Social Connections: Not on file  Intimate Partner Violence: Not At Risk (11/09/2023)   Humiliation, Afraid, Rape, and Kick questionnaire    Fear of Current or Ex-Partner: No    Emotionally Abused: No    Physically Abused: No    Sexually Abused: No    FAMILY HISTORY: Family  History  Problem Relation Age of Onset   Lung cancer Mother    Stroke Father    Breast cancer Neg Hx     ALLERGIES:  is allergic to amlodipine, cephalosporins, codeine, hydrochlorothiazide, hydromorphone, latex, losartan potassium, metoprolol, doxycycline, lisinopril, sucralfate, cholecalciferol, ciprofloxacin, clonidine hcl, diltiazem, doxazosin mesylate, hydralazine , hydralazine  hcl, iodine-131, ivp dye [iodinated contrast media], methyldopa, mircera [methoxy polyethylene glycol-epoetin  beta], nifedipine, ranitidine hcl, sulfa antibiotics, cefuroxime axetil, diltiazem hcl, isosorbide,  morphine, penicillin g, and prazosin.  MEDICATIONS:  Current Outpatient Medications  Medication Sig Dispense Refill   acetaminophen  (TYLENOL ) 325 MG tablet Take 650 mg by mouth every 6 (six) hours as needed for moderate pain (pain score 4-6).     busPIRone (BUSPAR) 5 MG tablet Take 5 mg by mouth 2 (two) times daily.     cetirizine (ZYRTEC) 10 MG tablet Take 10 mg by mouth daily as needed.     cyclobenzaprine  (FLEXERIL ) 10 MG tablet Take 10 mg by mouth at bedtime as needed for muscle spasms.     Epoetin  Alfa-epbx (RETACRIT  IJ) Inject as directed. Monthly injection at cancer center     fluticasone (FLONASE) 50 MCG/ACT nasal spray Place 2 sprays into both nostrils daily as needed for allergies or rhinitis.     furosemide (LASIX) 40 MG tablet Take 80 mg by mouth daily.     hydrOXYzine (ATARAX) 25 MG tablet Take 25 mg by mouth every 6 (six) hours as needed for anxiety.     losartan (COZAAR) 50 MG tablet Take 50 mg by mouth daily.     Magnesium Oxide -Mg Supplement 250 MG TABS Take 1 tablet by mouth daily.     montelukast (SINGULAIR) 10 MG tablet Take 10 mg by mouth daily as needed (allergies).     multivitamin (RENA-VIT) TABS tablet Take 1 tablet by mouth daily.     omeprazole (PRILOSEC) 40 MG capsule Take 40 mg by mouth daily.     ondansetron  (ZOFRAN -ODT) 4 MG disintegrating tablet Take 4 mg by mouth every 8  (eight) hours as needed.     oxyCODONE -acetaminophen  (PERCOCET) 5-325 MG tablet Take 1 tablet by mouth every 6 (six) hours as needed for severe pain (pain score 7-10) or moderate pain (pain score 4-6). 30 tablet 0   sodium bicarbonate  650 MG tablet Take 650 mg by mouth 2 (two) times a week. Tuesday and Saturday     No current facility-administered medications for this visit.   Facility-Administered Medications Ordered in Other Visits  Medication Dose Route Frequency Provider Last Rate Last Admin   lidocaine -EPINEPHrine  (PF) (XYLOCAINE -EPINEPHrine ) 1 %-1:200000 (PF) injection    PRN Dew, Jason S, MD   20 mL at 09/05/23 1508    Review of Systems  Constitutional:  Positive for fatigue. Negative for chills and fever.  HENT:   Negative for hearing loss and voice change.   Eyes:  Negative for eye problems.  Respiratory:  Negative for chest tightness and cough.   Cardiovascular:  Negative for chest pain.  Gastrointestinal:  Negative for abdominal distention, abdominal pain and blood in stool.  Endocrine: Negative for hot flashes.  Genitourinary:  Negative for difficulty urinating and frequency.   Musculoskeletal:  Negative for arthralgias.  Skin:  Negative for itching and rash.  Neurological:  Negative for extremity weakness.  Hematological:  Negative for adenopathy.  Psychiatric/Behavioral:  Negative for confusion.    PHYSICAL EXAMINATION: ECOG PERFORMANCE STATUS: 1 - Symptomatic but completely ambulatory Vitals:   11/15/23 1048 11/15/23 1058  BP: (!) 179/91 (!) 177/91  Pulse: 77   Resp: 18   Temp: 99.2 F (37.3 C)   SpO2: 98%    Filed Weights   11/15/23 1048  Weight: 119 lb 9.6 oz (54.3 kg)    Physical Exam Constitutional:      General: She is not in acute distress. HENT:     Head: Normocephalic and atraumatic.  Eyes:     General: No scleral icterus. Cardiovascular:  Rate and Rhythm: Normal rate and regular rhythm.     Heart sounds: Normal heart sounds.  Pulmonary:      Effort: Pulmonary effort is normal. No respiratory distress.     Breath sounds: No wheezing.  Abdominal:     General: Bowel sounds are normal. There is no distension.     Palpations: Abdomen is soft.  Musculoskeletal:        General: No deformity. Normal range of motion.     Cervical back: Normal range of motion and neck supple.  Skin:    General: Skin is warm and dry.     Findings: No erythema or rash.  Neurological:     Mental Status: She is alert and oriented to person, place, and time. Mental status is at baseline.  Psychiatric:        Mood and Affect: Mood normal.     LABORATORY DATA:  I have reviewed the data as listed    Latest Ref Rng & Units 11/15/2023   10:24 AM 11/09/2023    4:51 AM 11/08/2023   11:17 PM  CBC  WBC 4.0 - 10.5 K/uL 6.8  11.5  13.6   Hemoglobin 12.0 - 15.0 g/dL 9.0  8.1  89.6   Hematocrit 36.0 - 46.0 % 28.2  25.7  33.2   Platelets 150 - 400 K/uL 185  251  392       Latest Ref Rng & Units 11/10/2023    3:16 AM 11/09/2023    4:51 AM 11/08/2023   11:17 PM  CMP  Glucose 70 - 99 mg/dL 85  893  837   BUN 8 - 23 mg/dL 32  80  79   Creatinine 0.44 - 1.00 mg/dL 3.62  87.36  87.42   Sodium 135 - 145 mmol/L 137  137  139   Potassium 3.5 - 5.1 mmol/L 4.2  6.5  4.9   Chloride 98 - 111 mmol/L 96  104  105   CO2 22 - 32 mmol/L 28  17  18    Calcium 8.9 - 10.3 mg/dL 7.7  7.6  8.5   Total Protein 6.5 - 8.1 g/dL   7.8   Total Bilirubin 0.0 - 1.2 mg/dL   0.6   Alkaline Phos 38 - 126 U/L   73   AST 15 - 41 U/L   23   ALT 0 - 44 U/L   11       RADIOGRAPHIC STUDIES: I have personally reviewed the radiological images as listed and agreed with the findings in the report. DG Chest Port 1 View Result Date: 11/08/2023 CLINICAL DATA:  Dyspnea EXAM: PORTABLE CHEST 1 VIEW COMPARISON:  10/14/2023 FINDINGS: Cardiomegaly. Extensive bilateral pulmonary edema or infiltrates compared to prior. No sizable effusion. Aortic atherosclerosis. No pneumothorax IMPRESSION:  Cardiomegaly with extensive bilateral pulmonary edema or infiltrates. Electronically Signed   By: Luke Bun M.D.   On: 11/08/2023 23:53   DG Chest 2 View Result Date: 10/20/2023 CLINICAL DATA:  TB screening. EXAM: CHEST - 2 VIEW COMPARISON:  Chest radiograph 09/19/2019 FINDINGS: Stable cardiomegaly. Heterogeneous opacities left lung base. No pleural effusion or pneumothorax. Thoracic spine degenerative changes. IMPRESSION: Heterogeneous opacities left lung base may represent atelectasis or infection. Electronically Signed   By: Bard Moats M.D.   On: 10/20/2023 21:05   VAS US  DUPLEX DIALYSIS ACCESS (AVF,AVG) Result Date: 09/23/2023 DIALYSIS ACCESS Patient Name:  ASHANNA HEINSOHN  Date of Exam:   09/20/2023 Medical Rec #: [id number]  Accession #:    [id number] Date of Birth: 03-07-1960       Patient Gender: F Patient Age:   35 years Exam Location:  [facility] Procedure:      VAS US  DUPLEX DIALYSIS ACCESS (AVF, AVG) Referring Phys: ORVIN DARING --------------------------------------------------------------------------------  Reason for Exam: Routine follow up. Access Site: Left Upper Extremity. Access Type: BrachAx. History: 05/31/2023: Left Brachial Axillary AVG Created. Comparison Study: 06/2023 Performing Technologist: Jerel Croak RVT  Examination Guidelines: A complete evaluation includes B-mode imaging, spectral Doppler, color Doppler, and power Doppler as needed of all accessible portions of each vessel. Unilateral testing is considered an integral part of a complete examination. Limited examinations for reoccurring indications may be performed as noted.  Findings:   +--------------------+----------+-----------------+--------+ AVG                 PSV (cm/s)Flow Vol (mL/min)Describe +--------------------+----------+-----------------+--------+ Native artery inflow   329          2237                +--------------------+----------+-----------------+--------+ Arterial anastomosis    593                              +--------------------+----------+-----------------+--------+ Prox graft             603                              +--------------------+----------+-----------------+--------+ Mid graft              336                              +--------------------+----------+-----------------+--------+ Distal graft           272                              +--------------------+----------+-----------------+--------+ Venous anastomosis     255                              +--------------------+----------+-----------------+--------+ Venous outflow         350                              +--------------------+----------+-----------------+--------+ +--------------+------------+----------+---------+---------+-------------------+                 Diameter  Depth (cm)Branching   PSV       Flow Volume                       (cm)                        (cm/s)       (ml/min)       +--------------+------------+----------+---------+---------+-------------------+ Lt Rad Art Dis                                  49                        +--------------+------------+----------+---------+---------+-------------------+ antegrade                                                                 +--------------+------------+----------+---------+---------+-------------------+  Summary: Patent arteriovenous graft. *See table(s) above for measurements and observations.  Diagnosing physician: Cordella Shawl MD Electronically signed by Cordella Shawl MD on 09/23/2023 at 7:50:26 AM.   --------------------------------------------------------------------------------   Final    PERIPHERAL VASCULAR CATHETERIZATION Result Date: 09/05/2023 See surgical note for result.

## 2023-11-17 ENCOUNTER — Emergency Department

## 2023-11-17 ENCOUNTER — Other Ambulatory Visit: Payer: Self-pay

## 2023-11-17 ENCOUNTER — Inpatient Hospital Stay
Admission: EM | Admit: 2023-11-17 | Discharge: 2023-11-20 | DRG: 189 | Disposition: A | Discharge status: Home / Self Care | Attending: Student | Admitting: Student

## 2023-11-17 DIAGNOSIS — E785 Hyperlipidemia, unspecified: Secondary | ICD-10-CM | POA: Diagnosis present

## 2023-11-17 DIAGNOSIS — Z888 Allergy status to other drugs, medicaments and biological substances status: Secondary | ICD-10-CM

## 2023-11-17 DIAGNOSIS — Z91041 Radiographic dye allergy status: Secondary | ICD-10-CM

## 2023-11-17 DIAGNOSIS — Z881 Allergy status to other antibiotic agents status: Secondary | ICD-10-CM

## 2023-11-17 DIAGNOSIS — Z885 Allergy status to narcotic agent status: Secondary | ICD-10-CM

## 2023-11-17 DIAGNOSIS — R54 Age-related physical debility: Secondary | ICD-10-CM | POA: Diagnosis present

## 2023-11-17 DIAGNOSIS — J9601 Acute respiratory failure with hypoxia: Principal | ICD-10-CM | POA: Diagnosis present

## 2023-11-17 DIAGNOSIS — Z79899 Other long term (current) drug therapy: Secondary | ICD-10-CM

## 2023-11-17 DIAGNOSIS — J189 Pneumonia, unspecified organism: Secondary | ICD-10-CM | POA: Diagnosis present

## 2023-11-17 DIAGNOSIS — Z87442 Personal history of urinary calculi: Secondary | ICD-10-CM

## 2023-11-17 DIAGNOSIS — J810 Acute pulmonary edema: Secondary | ICD-10-CM

## 2023-11-17 DIAGNOSIS — B964 Proteus (mirabilis) (morganii) as the cause of diseases classified elsewhere: Secondary | ICD-10-CM | POA: Diagnosis present

## 2023-11-17 DIAGNOSIS — Z823 Family history of stroke: Secondary | ICD-10-CM | POA: Diagnosis not present

## 2023-11-17 DIAGNOSIS — N390 Urinary tract infection, site not specified: Secondary | ICD-10-CM | POA: Diagnosis present

## 2023-11-17 DIAGNOSIS — D631 Anemia in chronic kidney disease: Secondary | ICD-10-CM | POA: Diagnosis present

## 2023-11-17 DIAGNOSIS — I161 Hypertensive emergency: Secondary | ICD-10-CM | POA: Diagnosis present

## 2023-11-17 DIAGNOSIS — I3139 Other pericardial effusion (noninflammatory): Secondary | ICD-10-CM | POA: Diagnosis present

## 2023-11-17 DIAGNOSIS — I132 Hypertensive heart and chronic kidney disease with heart failure and with stage 5 chronic kidney disease, or end stage renal disease: Secondary | ICD-10-CM | POA: Diagnosis present

## 2023-11-17 DIAGNOSIS — Z8701 Personal history of pneumonia (recurrent): Secondary | ICD-10-CM | POA: Diagnosis not present

## 2023-11-17 DIAGNOSIS — N186 End stage renal disease: Secondary | ICD-10-CM | POA: Diagnosis present

## 2023-11-17 DIAGNOSIS — Z87410 Personal history of cervical dysplasia: Secondary | ICD-10-CM

## 2023-11-17 DIAGNOSIS — I5022 Chronic systolic (congestive) heart failure: Secondary | ICD-10-CM | POA: Diagnosis present

## 2023-11-17 DIAGNOSIS — N2581 Secondary hyperparathyroidism of renal origin: Secondary | ICD-10-CM | POA: Diagnosis present

## 2023-11-17 DIAGNOSIS — I340 Nonrheumatic mitral (valve) insufficiency: Secondary | ICD-10-CM | POA: Diagnosis present

## 2023-11-17 DIAGNOSIS — Z87440 Personal history of urinary (tract) infections: Secondary | ICD-10-CM | POA: Diagnosis not present

## 2023-11-17 DIAGNOSIS — Z8616 Personal history of COVID-19: Secondary | ICD-10-CM | POA: Diagnosis not present

## 2023-11-17 DIAGNOSIS — K219 Gastro-esophageal reflux disease without esophagitis: Secondary | ICD-10-CM | POA: Diagnosis present

## 2023-11-17 DIAGNOSIS — Z91040 Latex allergy status: Secondary | ICD-10-CM

## 2023-11-17 DIAGNOSIS — Z801 Family history of malignant neoplasm of trachea, bronchus and lung: Secondary | ICD-10-CM | POA: Diagnosis not present

## 2023-11-17 DIAGNOSIS — I2489 Other forms of acute ischemic heart disease: Secondary | ICD-10-CM | POA: Diagnosis present

## 2023-11-17 DIAGNOSIS — Z992 Dependence on renal dialysis: Secondary | ICD-10-CM | POA: Diagnosis not present

## 2023-11-17 DIAGNOSIS — Z882 Allergy status to sulfonamides status: Secondary | ICD-10-CM

## 2023-11-17 LAB — BLOOD GAS, VENOUS
Acid-base deficit: 5.1 mmol/L — ABNORMAL HIGH (ref 0.0–2.0)
Bicarbonate: 21.2 mmol/L (ref 20.0–28.0)
Delivery systems: POSITIVE
FIO2: 100 %
O2 Saturation: 83.7 %
Patient temperature: 37
pCO2, Ven: 43 mmHg — ABNORMAL LOW (ref 44–60)
pH, Ven: 7.3 (ref 7.25–7.43)
pO2, Ven: 55 mmHg — ABNORMAL HIGH (ref 32–45)

## 2023-11-17 LAB — CBC WITH DIFFERENTIAL/PLATELET
Abs Immature Granulocytes: 0.05 K/uL (ref 0.00–0.07)
Basophils Absolute: 0.1 K/uL (ref 0.0–0.1)
Basophils Relative: 1 %
Eosinophils Absolute: 0.8 K/uL — ABNORMAL HIGH (ref 0.0–0.5)
Eosinophils Relative: 6 %
HCT: 31.5 % — ABNORMAL LOW (ref 36.0–46.0)
Hemoglobin: 9.9 g/dL — ABNORMAL LOW (ref 12.0–15.0)
Immature Granulocytes: 0 %
Lymphocytes Relative: 24 %
Lymphs Abs: 3.3 K/uL (ref 0.7–4.0)
MCH: 31 pg (ref 26.0–34.0)
MCHC: 31.4 g/dL (ref 30.0–36.0)
MCV: 98.7 fL (ref 80.0–100.0)
Monocytes Absolute: 0.5 K/uL (ref 0.1–1.0)
Monocytes Relative: 4 %
Neutro Abs: 8.7 K/uL — ABNORMAL HIGH (ref 1.7–7.7)
Neutrophils Relative %: 65 %
Platelets: 254 K/uL (ref 150–400)
RBC: 3.19 MIL/uL — ABNORMAL LOW (ref 3.87–5.11)
RDW: 16 % — ABNORMAL HIGH (ref 11.5–15.5)
WBC: 13.3 K/uL — ABNORMAL HIGH (ref 4.0–10.5)
nRBC: 0 % (ref 0.0–0.2)

## 2023-11-17 LAB — LACTIC ACID, PLASMA
Lactic Acid, Venous: 2 mmol/L (ref 0.5–1.9)
Lactic Acid, Venous: 0.8 mmol/L (ref 0.5–1.9)

## 2023-11-17 LAB — PROTIME-INR
INR: 1 (ref 0.8–1.2)
Prothrombin Time: 13.6 s (ref 11.4–15.2)

## 2023-11-17 LAB — COMPREHENSIVE METABOLIC PANEL WITH GFR
ALT: 10 U/L (ref 0–44)
AST: 22 U/L (ref 15–41)
Albumin: 4 g/dL (ref 3.5–5.0)
Alkaline Phosphatase: 57 U/L (ref 38–126)
Anion gap: 15 (ref 5–15)
BUN: 48 mg/dL — ABNORMAL HIGH (ref 8–23)
CO2: 21 mmol/L — ABNORMAL LOW (ref 22–32)
Calcium: 8.6 mg/dL — ABNORMAL LOW (ref 8.9–10.3)
Chloride: 102 mmol/L (ref 98–111)
Creatinine, Ser: 8.07 mg/dL — ABNORMAL HIGH (ref 0.44–1.00)
GFR, Estimated: 5 mL/min — ABNORMAL LOW (ref >60)
Glucose, Bld: 145 mg/dL — ABNORMAL HIGH (ref 70–99)
Potassium: 4.4 mmol/L (ref 3.5–5.1)
Sodium: 138 mmol/L (ref 135–145)
Total Bilirubin: 0.7 mg/dL (ref 0.0–1.2)
Total Protein: 7.3 g/dL (ref 6.5–8.1)

## 2023-11-17 LAB — RESP PANEL BY RT-PCR (RSV, FLU A&B, COVID)  RVPGX2
Influenza A by PCR: NEGATIVE
Influenza B by PCR: NEGATIVE
Resp Syncytial Virus by PCR: NEGATIVE
SARS Coronavirus 2 by RT PCR: NEGATIVE

## 2023-11-17 LAB — TROPONIN I (HIGH SENSITIVITY): Troponin I (High Sensitivity): 46 ng/L — ABNORMAL HIGH (ref <18)

## 2023-11-17 LAB — BRAIN NATRIURETIC PEPTIDE: B Natriuretic Peptide: 3461.3 pg/mL — ABNORMAL HIGH (ref 0.0–100.0)

## 2023-11-17 MED ORDER — NITROGLYCERIN IN D5W 200-5 MCG/ML-% IV SOLN
0.0000 ug/min | INTRAVENOUS | Status: DC
  Administered 2023-11-17: 10 ug/min via INTRAVENOUS
  Filled 2023-11-17: qty 250

## 2023-11-17 MED ORDER — ACETAMINOPHEN 325 MG PO TABS
650.0000 mg | ORAL_TABLET | Freq: Four times a day (QID) | ORAL | Status: DC | PRN
  Administered 2023-11-18 (×2): 650 mg via ORAL
  Filled 2023-11-17: qty 2

## 2023-11-17 MED ORDER — HEPARIN SODIUM (PORCINE) 5000 UNIT/ML IJ SOLN
5000.0000 [IU] | Freq: Three times a day (TID) | INTRAMUSCULAR | Status: DC
  Administered 2023-11-18 – 2023-11-20 (×8): 5000 [IU] via SUBCUTANEOUS
  Filled 2023-11-17 (×8): qty 1

## 2023-11-17 MED ORDER — PROCHLORPERAZINE EDISYLATE 10 MG/2ML IJ SOLN
10.0000 mg | Freq: Four times a day (QID) | INTRAMUSCULAR | Status: DC | PRN
  Administered 2023-11-18: 10 mg via INTRAVENOUS
  Filled 2023-11-17: qty 2

## 2023-11-17 MED ORDER — POLYETHYLENE GLYCOL 3350 17 G PO PACK: 17.0000 g | PACK | Freq: Every day | ORAL | Status: DC | PRN

## 2023-11-17 MED ORDER — ENALAPRILAT 1.25 MG/ML IV SOLN
1.2500 mg | Freq: Once | INTRAVENOUS | Status: DC
  Filled 2023-11-17 (×2): qty 1

## 2023-11-17 MED ORDER — LORAZEPAM 2 MG/ML IJ SOLN
1.0000 mg | Freq: Once | INTRAMUSCULAR | Status: AC
Start: 2023-11-17 — End: 2023-11-17
  Administered 2023-11-17: 1 mg via INTRAVENOUS
  Filled 2023-11-17: qty 1

## 2023-11-17 MED ORDER — SODIUM CHLORIDE 0.9 % IV SOLN
500.0000 mg | Freq: Once | INTRAVENOUS | Status: AC
  Administered 2023-11-17: 500 mg via INTRAVENOUS
  Filled 2023-11-17: qty 5

## 2023-11-17 MED ORDER — FUROSEMIDE 10 MG/ML IJ SOLN
160.0000 mg | Freq: Once | INTRAVENOUS | Status: AC
  Administered 2023-11-17: 160 mg via INTRAVENOUS
  Filled 2023-11-17 (×2): qty 16

## 2023-11-17 MED ORDER — MELATONIN 5 MG PO TABS
5.0000 mg | ORAL_TABLET | Freq: Every evening | ORAL | Status: DC | PRN
  Administered 2023-11-18 – 2023-11-19 (×2): 5 mg via ORAL
  Filled 2023-11-17 (×2): qty 1

## 2023-11-17 MED ORDER — SODIUM CHLORIDE 0.9 % IV SOLN
2.0000 g | Freq: Once | INTRAVENOUS | Status: AC
  Administered 2023-11-17: 2 g via INTRAVENOUS
  Filled 2023-11-17: qty 20

## 2023-11-17 NOTE — ED Notes (Addendum)
 Messaged pharmacy again regarding lasix drip. Stated that they were working on it.

## 2023-11-17 NOTE — Sepsis Progress Note (Signed)
 Elink following for sepsis protocol.

## 2023-11-17 NOTE — ED Triage Notes (Signed)
 Patient brought in via [facility] for respiratory distress. Patient found in [employer] parking lot with extreme shortness of breath. EMS placed patient on CPAP with O2 sats at 90%. On arrival patient in tripod position stating she feels like she cannot breathe. Patient is coughing up thick sputum.

## 2023-11-17 NOTE — ED Notes (Signed)
 Called pharmacy to request enalaprilat d/t it being grayed out in the pyxis.

## 2023-11-17 NOTE — H&P (Incomplete)
 History and Physical  Jessica Patterson FMW:[id number] DOB: 1960/03/26 DOA: 11/17/2023  Referring physician: Dr. Viviann, EDP  PCP: Ricard Tawni KIDD, MD  Outpatient Specialists: Nephrology. Patient coming from: Home.  Chief Complaint: Shortness of breath.  HPI: Jessica Patterson is a 63 y.o. female with medical history significant for ESRD on HD TTS, started hemodialysis in January 2025 followed by nephrology at [facility], uncontrolled hypertension with multiple allergies to antihypertensives, who presents to the ER with complaints of sudden onset shortness of breath today.  Last hemodialysis was on Saturday, completed 3 hours session.  States she has allergies to antihypertensives and has been started on oral antihypertensives 1 at the time to identify her allergies.  She was on losartan and furosemide prior to admission.  Upon presentation to the ER, the patient was severely hypertensive with SBP in the 220s and DBP's in the 150s.  This was improved with nitro drip.  Initially requiring BiPAP due to flash pulmonary edema seen on chest x-ray.  The patient was additionally started on IV Lasix.  Admitted by [facility], hospitalist service.  ED Course: Temperature 97.8.  BP 160/94, pulse 89, respiration rate 21, O2 saturation 100% on 6 L.  Review of Systems: Review of systems as noted in the HPI. All other systems reviewed and are negative.   Past Medical History:  Diagnosis Date   Anemia    ESRD   Anxiety    Cervical dysplasia    CKD (chronic kidney disease), stage IV (HCC)    Depression    Exstrophy of bladder    GERD (gastroesophageal reflux disease)    History of kidney stones    Hyperlipidemia    Hypertension    Kidney stone    Pneumonia due to COVID-19 virus    PONV (postoperative nausea and vomiting)    Recurrent UTI    Seasonal allergies    Sepsis [facility])    Past Surgical History:  Procedure Laterality Date   ABDOMINAL HERNIA REPAIR N/A 07/12/2017   ABDOMINAL  HYSTERECTOMY N/A 12/23/2015   Boyce-Vest procedure     age 8   CERVICAL CONE BIOPSY  05/26/2013   COLOSTOMY N/A    COLPOSCOPY  07/19/2015   DIAGNOSTIC LAPAROSCOPY  09/18/2017   abdominal wall mesh excision   DIALYSIS/PERMA CATHETER INSERTION     DIALYSIS/PERMA CATHETER REMOVAL N/A 09/05/2023   Procedure: DIALYSIS/PERMA CATHETER REMOVAL;  Surgeon: Marea Selinda RAMAN, MD;  Location: [facility] INVASIVE CV LAB;  Service: Cardiovascular;  Laterality: N/A;   INSERTION OF ARTERIOVENOUS (AV) ARTEGRAFT ARM Left 05/31/2023   Procedure: INSERTION, GRAFT, ARTERIOVENOUS, UPPER EXTREMITY;  Surgeon: Jama Cordella MATSU, MD;  Location: [facility] ORS;  Service: Vascular;  Laterality: Left;  BRACHIAL AXILLARY   KIDNEY STONE SURGERY     NEPHROSTOMY TUBE PLACEMENT ([facility] HX) N/A    SMALL INTESTINE SURGERY     x3   URETEROSIGMOIDOSTOMY      Social History:  reports that she has never smoked. She has never used smokeless tobacco. She reports that she does not drink alcohol and does not use drugs.   Allergies  Allergen Reactions   Amlodipine Shortness Of Breath and Other (See Comments)   Cephalosporins Shortness Of Breath and Nausea And Vomiting    Has tolerated 2nd (CEFOXITIN) and 3rd (CEFPODOXIME) generation cephalosporins in the past without documented ADRs.    Codeine Shortness Of Breath and Nausea And Vomiting   Hydrochlorothiazide Shortness Of Breath and Other (See Comments)   Hydromorphone Hives, Nausea And Vomiting and  Other (See Comments)    Other Reaction(s): GI Intolerance   Latex Itching and Rash    Other reaction(s): Itching  Other reaction(s): Itching    Other reaction(s): Itching Other reaction(s): Itching   Losartan Potassium Shortness Of Breath and Other (See Comments)    Other reaction(s): Shortness Of Breath Other reaction(s): Shortness Of Breath Per pt has been able to take and tolerate   Metoprolol Other (See Comments)   Doxycycline Hives and Rash    Other reaction(s): Hives Other reaction(s):  Hives    Lisinopril     Other Reaction(s): cough (is bothersome, but cannot tolerate any other meds - see rest of reactions)   Sucralfate Rash   Cholecalciferol Other (See Comments)   Ciprofloxacin Other (See Comments)   Clonidine Hcl Nausea And Vomiting   Diltiazem    Doxazosin Mesylate Other (See Comments)   Hydralazine     Hydralazine  Hcl Other (See Comments)   Iodine-131 Other (See Comments)    Can not receive due to Kidney disease Can not receive due to Kidney disease    Ivp Dye [Iodinated Contrast Media]    Methyldopa Nausea Only    Blisters    Mircera [Methoxy Polyethylene Glycol-Epoetin  Beta]    Nifedipine     Other Reaction(s): Other (See Comments)  CHEST PAIN ACHING   Ranitidine Hcl Other (See Comments)   Sulfa Antibiotics    Cefuroxime Axetil Nausea And Vomiting   Diltiazem Hcl Nausea Only    Other Reaction(s): GI Intolerance   Isosorbide Nausea And Vomiting and Other (See Comments)    Other Reaction(s): GI Intolerance   Morphine Hives, Nausea And Vomiting and Other (See Comments)    Other Reaction(s): GI Intolerance   Penicillin G Rash    welts    Prazosin Other (See Comments)    Headaches    Family History  Problem Relation Age of Onset   Lung cancer Mother    Stroke Father    Breast cancer Neg Hx       Prior to Admission medications   Medication Sig Start Date End Date Taking? Authorizing Provider  amLODipine (NORVASC) 10 MG tablet Take 10 mg by mouth 2 (two) times daily.   Yes [provider]  AURYXIA 1 GM 210 MG(Fe) tablet Take 210 mg by mouth 3 (three) times daily. 10/29/23  Yes [provider]  bisoprolol (ZEBETA) 5 MG tablet Take 5 mg by mouth daily.   Yes [provider]  busPIRone (BUSPAR) 5 MG tablet Take 5 mg by mouth 2 (two) times daily. 02/15/23  Yes [provider]  furosemide (LASIX) 80 MG tablet Take 80 mg by mouth as directed. TAKE 1 TABLET BY MOUTH ONCE DAILY AND EXTRA DOSE AS NEEDED FOR SWELLING OR  SHORTNESS OF BREATH 02/15/23  Yes [provider]  losartan (COZAAR) 100 MG tablet Take 100 mg by mouth daily.   Yes [provider]  multivitamin (RENA-VIT) TABS tablet Take 1 tablet by mouth daily. 10/28/23  Yes [provider]  acetaminophen  (TYLENOL ) 325 MG tablet Take 650 mg by mouth every 6 (six) hours as needed for moderate pain (pain score 4-6).    [provider]  cetirizine (ZYRTEC) 10 MG tablet Take 10 mg by mouth daily as needed.    [provider]  cyclobenzaprine  (FLEXERIL ) 10 MG tablet Take 10 mg by mouth at bedtime as needed for muscle spasms. 09/16/19   [provider]  Epoetin  Alfa-epbx (RETACRIT  IJ) Inject as directed. Monthly injection at  cancer center    [provider]  fluticasone (FLONASE) 50 MCG/ACT nasal spray Place 2 sprays into both nostrils daily as needed for allergies or rhinitis.    [provider]  hydrOXYzine (ATARAX) 25 MG tablet Take 25 mg by mouth every 6 (six) hours as needed for anxiety. 02/15/23   [provider]  losartan (COZAAR) 50 MG tablet Take 50 mg by mouth daily. 11/06/23 12/10/24  [provider]  Magnesium Oxide -Mg Supplement 250 MG TABS Take 1 tablet by mouth daily.    [provider]  montelukast (SINGULAIR) 10 MG tablet Take 10 mg by mouth daily as needed (allergies).    [provider]  omeprazole (PRILOSEC) 40 MG capsule Take 40 mg by mouth daily. 09/12/23   [provider]  ondansetron  (ZOFRAN -ODT) 4 MG disintegrating tablet Take 4 mg by mouth every 8 (eight) hours as needed. 09/19/23   [provider]  oxyCODONE -acetaminophen  (PERCOCET) 5-325 MG tablet Take 1 tablet by mouth every 6 (six) hours as needed for severe pain (pain score 7-10) or moderate pain (pain score 4-6). 05/31/23 05/30/24  Schnier, Cordella MATSU, MD  sodium bicarbonate  650 MG tablet Take 650 mg by mouth 2 (two) times a week. Tuesday and Saturday    [provider]    Physical Exam: BP (!) 137/93   Pulse 100   Temp 97.6 F (36.4 C) (Axillary)   Resp (!) 28   Ht 5' (1.524 m)   Wt 58 kg   SpO2 97%   BMI 24.98 kg/m   General: 63 y.o. year-old female well developed well nourished in no acute distress.  Alert and oriented x3. Cardiovascular: Regular rate and rhythm with no rubs or gallops.  No thyromegaly or JVD noted.  No lower extremity edema. 2/4 pulses in all 4 extremities. Respiratory: Faint rales at bases bilaterally.   Abdomen: Soft nontender nondistended with normal bowel sounds x4 quadrants. Muskuloskeletal: No cyanosis or clubbing noted bilaterally Neuro: CN II-XII intact, strength, sensation, reflexes Skin: No ulcerative lesions noted or rashes Psychiatry: Judgement and insight appear normal. Mood is appropriate for condition and setting          Labs on Admission:  Basic Metabolic Panel: Recent Labs  Lab 11/17/23 2024  NA 138  K 4.4  CL 102  CO2 21*  GLUCOSE 145*  BUN 48*  CREATININE 8.07*  CALCIUM 8.6*   Liver Function Tests: Recent Labs  Lab 11/17/23 2024  AST 22  ALT 10  ALKPHOS 57  BILITOT 0.7  PROT 7.3  ALBUMIN 4.0   No results for input(s): LIPASE, AMYLASE in the last 168 hours. No results for input(s): AMMONIA in the last 168 hours. CBC: Recent Labs  Lab 11/15/23 1024 11/17/23 2024  WBC 6.8 13.3*  NEUTROABS 3.7 8.7*  HGB 9.0* 9.9*  HCT 28.2* 31.5*  MCV 96.6 98.7  PLT 185 254   Cardiac Enzymes: No results for input(s): CKTOTAL, CKMB, CKMBINDEX, TROPONINI in the last 168 hours.  BNP (last 3 results) Recent Labs    11/08/23 2317 11/17/23 2024  BNP 3,044.7* 3,461.3*    ProBNP (last 3 results) No results for input(s): PROBNP in the last 8760 hours.  CBG: No results for input(s): GLUCAP in the last 168 hours.  Radiological Exams on Admission: DG Chest Port 1 View Result Date: 11/17/2023 CLINICAL DATA:  Respiratory distress EXAM: PORTABLE CHEST 1 VIEW  COMPARISON:  11/08/2023 FINDINGS: Heart is borderline in size. Mediastinal contours within normal limits. Extensive  bilateral perihilar and lower lobe airspace opacities, similar prior study. No effusions. No acute bony abnormality. IMPRESSION: Bilateral extensive airspace opacities in the perihilar and lower lobe regions could reflect edema or infection. Findings similar to prior study. Electronically Signed   By: Franky Crease M.D.   On: 11/17/2023 20:48    EKG: I independently viewed the EKG done and my findings are as followed: Sinus tachycardia rate of 116.  Nonspecific ST-T changes QTc 473.  Assessment/Plan Present on Admission:  Acute hypoxic respiratory failure (HCC)  Principal Problem:   Acute hypoxic respiratory failure (HCC)  Acute hypoxic respiratory failure secondary to flash pulmonary edema Presented with SBP in the 220s, DBP in the 150s, pulmonary edema seen on chest x-ray. Initially requiring BiPAP, weaned off to 6 L high flow nasal cannula. Continue to wean off O2 supplementation as tolerated Not on oxygen supplementation at baseline Incentive spirometer Early mobilization.  Elevated troponin, likely demand ischemia in the setting of acute hypoxic respiratory failure  Flash pulmonary edema BNP greater than 3400 Follow limited 2D echo Closely monitor on telemetry.  Possible CAP, POA Could not be excluded- bilateral lower lobe pneumonia from chest x-ray Received Rocephin and IV azithromycin in the ER Obtain baseline procalcitonin level Continue antibiotics until active infective process is ruled out.  ESRD on HD TTS Consider nephrology consultation in the morning Continue IV Lasix Last hemodialysis was Saturday, 11/16/2023.  Anemia of chronic disease Hemoglobin at baseline Monitor for now.   Critical care time: 65 minutes.   DVT prophylaxis: Subcu heparin  3 times daily  Code Status: Full code.  Family Communication: None at bedside.  Disposition Plan:  Admitted to progressive care unit.  Consults called: None.  Admission status: Inpatient status.   Status is: Inpatient The patient requires at least 2 midnights for further evaluation and treatment of present condition.   Terry LOISE Hurst MD [facility] Pager [phone]  If 7PM-7AM, please contact night-coverage [email or web address] Password [email or web address]  11/17/2023, 11:13 PM

## 2023-11-17 NOTE — ED Provider Notes (Addendum)
 [facility] Provider Note    Event Date/Time   First MD Initiated Contact with Patient 11/17/23 2012     (approximate)   History   Chief Complaint: Respiratory Distress   HPI  Jessica Patterson is a 63 y.o. female with a history of ESRD on hemodialysis Tuesday Thursday Saturday, compliant with routine dialysis schedule, GERD, hypertension who comes to the ED due to respiratory distress.  Had rapid onset of shortness of breath, EMS noted patient to be in tripod position, started CPAP with best oxygen saturation of 90%.  Patient is having frequent coughing.  Outside records reviewed noting that patient recently had hospitalization 1 week ago for flash pulmonary edema.        Past Medical History:  Diagnosis Date   Anemia    ESRD   Anxiety    Cervical dysplasia    CKD (chronic kidney disease), stage IV (HCC)    Depression    Exstrophy of bladder    GERD (gastroesophageal reflux disease)    History of kidney stones    Hyperlipidemia    Hypertension    Kidney stone    Pneumonia due to COVID-19 virus    PONV (postoperative nausea and vomiting)    Recurrent UTI    Seasonal allergies    Sepsis [facility])     Current Outpatient Rx   Order #: [id number] Class: Historical Med   Order #: [id number] Class: Historical Med   Order #: [id number] Class: Historical Med   Order #: [id number] Class: Historical Med   Order #: [id number] Class: Historical Med   Order #: [id number] Class: Historical Med   Order #: [id number] Class: Historical Med   Order #: [id number] Class: Historical Med   Order #: [id number] Class: Historical Med   Order #: [id number] Class: Historical Med   Order #: [id number] Class: Historical Med   Order #: [id number] Class: Historical Med   Order #: [id number] Class: Historical Med   Order #: [id number] Class: Historical Med   Order #: [id number] Class: Normal   Order #: [id number] Class: Historical Med    Past Surgical History:  Procedure Laterality Date    ABDOMINAL HERNIA REPAIR N/A 07/12/2017   ABDOMINAL HYSTERECTOMY N/A 12/23/2015   Boyce-Vest procedure     age 41   CERVICAL CONE BIOPSY  05/26/2013   COLOSTOMY N/A    COLPOSCOPY  07/19/2015   DIAGNOSTIC LAPAROSCOPY  09/18/2017   abdominal wall mesh excision   DIALYSIS/PERMA CATHETER INSERTION     DIALYSIS/PERMA CATHETER REMOVAL N/A 09/05/2023   Procedure: DIALYSIS/PERMA CATHETER REMOVAL;  Surgeon: Marea Selinda RAMAN, MD;  Location: [facility] INVASIVE CV LAB;  Service: Cardiovascular;  Laterality: N/A;   INSERTION OF ARTERIOVENOUS (AV) ARTEGRAFT ARM Left 05/31/2023   Procedure: INSERTION, GRAFT, ARTERIOVENOUS, UPPER EXTREMITY;  Surgeon: Jama Cordella MATSU, MD;  Location: [facility] ORS;  Service: Vascular;  Laterality: Left;  BRACHIAL AXILLARY   KIDNEY STONE SURGERY     NEPHROSTOMY TUBE PLACEMENT ([facility] HX) N/A    SMALL INTESTINE SURGERY     x3   URETEROSIGMOIDOSTOMY      Physical Exam   Triage Vital Signs: ED Triage Vitals  Encounter Vitals Group     BP 11/17/23 2021 (!) 166/136     Girls Systolic BP Percentile --      Girls Diastolic BP Percentile --      Boys Systolic BP Percentile --      Boys Diastolic BP Percentile --      Pulse Rate 11/17/23 2021 (!) 115     Resp 11/17/23 2021 (!) 30  Temp 11/17/23 2021 97.6 F (36.4 C)     Temp Source 11/17/23 2021 Axillary     SpO2 11/17/23 2015 90 %     Weight 11/17/23 2021 127 lb 14.4 oz (58 kg)     Height 11/17/23 2021 5' (1.524 m)     Head Circumference --      Peak Flow --      Pain Score 11/17/23 2021 4     Pain Loc --      Pain Education --      Exclude from Growth Chart --     Most recent vital signs: Vitals:   11/17/23 2200 11/17/23 2208  BP: (!) 137/93   Pulse: (!) 105 100  Resp: (!) 29 (!) 28  Temp:    SpO2: 96% 97%    General: Awake, moderate respiratory distress CV:  Good peripheral perfusion.  Tachycardia heart rate 115 Resp:  Tachypnea, accessory muscle use.  Diffuse inspiratory crackles. Abd:  No distention.  Soft  nontender Other:  No significant lower extremity edema   ED Results / Procedures / Treatments   Labs (all labs ordered are listed, but only abnormal results are displayed) Labs Reviewed  LACTIC ACID, PLASMA - Abnormal; Notable for the following components:      Result Value   Lactic Acid, Venous 2.0 (*)    All other components within normal limits  COMPREHENSIVE METABOLIC PANEL WITH GFR - Abnormal; Notable for the following components:   CO2 21 (*)    Glucose, Bld 145 (*)    BUN 48 (*)    Creatinine, Ser 8.07 (*)    Calcium 8.6 (*)    GFR, Estimated 5 (*)    All other components within normal limits  CBC WITH DIFFERENTIAL/PLATELET - Abnormal; Notable for the following components:   WBC 13.3 (*)    RBC 3.19 (*)    Hemoglobin 9.9 (*)    HCT 31.5 (*)    RDW 16.0 (*)    Neutro Abs 8.7 (*)    Eosinophils Absolute 0.8 (*)    All other components within normal limits  BLOOD GAS, VENOUS - Abnormal; Notable for the following components:   pCO2, Ven 43 (*)    pO2, Ven 55 (*)    Acid-base deficit 5.1 (*)    All other components within normal limits  BRAIN NATRIURETIC PEPTIDE - Abnormal; Notable for the following components:   B Natriuretic Peptide 3,461.3 (*)    All other components within normal limits  RESP PANEL BY RT-PCR (RSV, FLU A&B, COVID)  RVPGX2  CULTURE, BLOOD (ROUTINE X 2)  CULTURE, BLOOD (ROUTINE X 2)  PROTIME-INR  LACTIC ACID, PLASMA  URINALYSIS, W/ REFLEX TO CULTURE (INFECTION SUSPECTED)  TROPONIN I (HIGH SENSITIVITY)     EKG Interpreted by me Sinus tachycardia rate 116.  Normal axis and intervals.  Normal QRS ST segments and T waves.   RADIOLOGY Chest x-ray interpreted by me, shows diffuse hazy infiltrates consistent with edema.  Radiology report reviewed.   PROCEDURES:  .Critical Care  Performed by: Viviann Pastor, MD Authorized by: Viviann Pastor, MD   Critical care provider statement:    Critical care time (minutes):  35   Critical care  time was exclusive of:  Separately billable procedures and treating other patients   Critical care was necessary to treat or prevent imminent or life-threatening deterioration of the following conditions:  Respiratory failure and renal failure   Critical care was time spent personally by me on the following  activities:  Development of treatment plan with patient or surrogate, discussions with consultants, evaluation of patient's response to treatment, examination of patient, obtaining history from patient or surrogate, ordering and performing treatments and interventions, ordering and review of laboratory studies, ordering and review of radiographic studies, pulse oximetry, re-evaluation of patient's condition and review of old charts   Care discussed with: admitting provider      MEDICATIONS ORDERED IN ED: Medications  enalaprilat (VASOTEC) injection 1.25 mg (0 mg Intravenous Hold 11/17/23 2152)  nitroGLYCERIN 50 mg in dextrose  5 % 250 mL (0.2 mg/mL) infusion (0 mcg/min Intravenous Paused 11/17/23 2136)  furosemide (LASIX) 160 mg in dextrose  5 % 50 mL IVPB (has no administration in time range)  cefTRIAXone (ROCEPHIN) 2 g in sodium chloride  0.9 % 100 mL IVPB (0 g Intravenous Stopped 11/17/23 2108)  azithromycin (ZITHROMAX) 500 mg in sodium chloride  0.9 % 250 mL IVPB (0 mg Intravenous Stopped 11/17/23 2214)  LORazepam (ATIVAN) injection 1 mg (1 mg Intravenous Given 11/17/23 2056)     IMPRESSION / MDM / ASSESSMENT AND PLAN / ED COURSE  I reviewed the triage vital signs and the nursing notes.  DDx:/Pulmonary edema, pneumonia, non-STEMI, electrolyte derangement, hypercapnia  Patient's presentation is most consistent with acute presentation with potential threat to life or bodily function.  Patient presents in respiratory distress, circumstances suspicious for flash pulmonary edema given her dialysis dependence and severe uncontrolled hypertension with blood pressure of about 220/150 initially.   EMS gave 1.25 of enalapril IV, will give additional 1.25 along with starting nitroglycerin infusion.  BiPAP initiated on arrival which patient seems to be handling well.  ----------------------------------------- 9:03 PM on 11/17/2023 ----------------------------------------- Continues symptomatically improving.  Will also start IV Lasix 160 mg.  Will plan to admit.   Clinical Course as of 11/17/23 2224  [place] Nov 17, 2023  2151 Breathing feeling much more comfortable, BiPAP discontinued and transition to 6 L nasal cannula. [PS]  2224 Case discussed with hospitalist [PS]    Clinical Course User Index [PS] Viviann Pastor, MD     FINAL CLINICAL IMPRESSION(S) / ED DIAGNOSES   Final diagnoses:  Acute respiratory failure with hypoxia (HCC)  Flash pulmonary edema (HCC)  ESRD on hemodialysis (HCC)     Rx / DC Orders   ED Discharge Orders     None        Note:  This document was prepared using Dragon voice recognition software and may include unintentional dictation errors.   Viviann Pastor, MD 11/17/23 2152    Viviann Pastor, MD 11/17/23 2224

## 2023-11-17 NOTE — ED Notes (Addendum)
 Pt transitioned to [facility] per Viviann MD.

## 2023-11-18 ENCOUNTER — Inpatient Hospital Stay
Admit: 2023-11-18 | Discharge: 2023-11-18 | Disposition: A | Discharge status: Home / Self Care | Attending: Internal Medicine | Admitting: Internal Medicine

## 2023-11-18 DIAGNOSIS — J9601 Acute respiratory failure with hypoxia: Secondary | ICD-10-CM | POA: Diagnosis not present

## 2023-11-18 LAB — URINALYSIS, W/ REFLEX TO CULTURE (INFECTION SUSPECTED)
Bilirubin Urine: NEGATIVE
Glucose, UA: 50 mg/dL — AB
Hgb urine dipstick: NEGATIVE
Ketones, ur: 5 mg/dL — AB
Nitrite: NEGATIVE
Protein, ur: 100 mg/dL — AB
Specific Gravity, Urine: 1.011 (ref 1.005–1.030)
Squamous Epithelial / HPF: 0 /HPF (ref 0–5)
WBC, UA: >50 WBC/hpf (ref 0–5)
pH: 9 — ABNORMAL HIGH (ref 5.0–8.0)

## 2023-11-18 LAB — RENAL FUNCTION PANEL
Albumin: 3.1 g/dL — ABNORMAL LOW (ref 3.5–5.0)
Anion gap: 18 — ABNORMAL HIGH (ref 5–15)
BUN: 52 mg/dL — ABNORMAL HIGH (ref 8–23)
CO2: 19 mmol/L — ABNORMAL LOW (ref 22–32)
Calcium: 8.1 mg/dL — ABNORMAL LOW (ref 8.9–10.3)
Chloride: 103 mmol/L (ref 98–111)
Creatinine, Ser: 8.58 mg/dL — ABNORMAL HIGH (ref 0.44–1.00)
GFR, Estimated: 5 mL/min — ABNORMAL LOW (ref >60)
Glucose, Bld: 97 mg/dL (ref 70–99)
Phosphorus: 5.9 mg/dL — ABNORMAL HIGH (ref 2.5–4.6)
Potassium: 5.6 mmol/L — ABNORMAL HIGH (ref 3.5–5.1)
Sodium: 140 mmol/L (ref 135–145)

## 2023-11-18 LAB — ECHOCARDIOGRAM LIMITED
AR max vel: 2.19 cm2
AV Area VTI: 2.15 cm2
AV Area mean vel: 1.94 cm2
AV Mean grad: 8.9 mmHg
AV Peak grad: 14.8 mmHg
Ao pk vel: 1.92 m/s
Area-P 1/2: 4.8 cm2
Height: 60 in
S' Lateral: 4 cm
Weight: 2046.4 [oz_av]

## 2023-11-18 LAB — CBC
HCT: 25.1 % — ABNORMAL LOW (ref 36.0–46.0)
Hemoglobin: 7.8 g/dL — ABNORMAL LOW (ref 12.0–15.0)
MCH: 30.5 pg (ref 26.0–34.0)
MCHC: 31.1 g/dL (ref 30.0–36.0)
MCV: 98 fL (ref 80.0–100.0)
Platelets: 186 K/uL (ref 150–400)
RBC: 2.56 MIL/uL — ABNORMAL LOW (ref 3.87–5.11)
RDW: 15.9 % — ABNORMAL HIGH (ref 11.5–15.5)
WBC: 9.7 K/uL (ref 4.0–10.5)
nRBC: 0 % (ref 0.0–0.2)

## 2023-11-18 LAB — FOLATE: Folate: 9.1 ng/mL (ref >5.9)

## 2023-11-18 LAB — PROCALCITONIN: Procalcitonin: 0.81 ng/mL

## 2023-11-18 MED ORDER — BISOPROLOL FUMARATE 5 MG PO TABS
5.0000 mg | ORAL_TABLET | Freq: Every day | ORAL | Status: DC
Start: 2023-11-18 — End: 2023-11-19
  Administered 2023-11-18: 5 mg via ORAL
  Filled 2023-11-18 (×2): qty 1

## 2023-11-18 MED ORDER — HYDROXYZINE HCL 25 MG PO TABS
25.0000 mg | ORAL_TABLET | Freq: Four times a day (QID) | ORAL | Status: DC | PRN
Start: 2023-11-18 — End: 2023-11-20

## 2023-11-18 MED ORDER — SODIUM CHLORIDE 0.9 % IV SOLN
2.0000 g | INTRAVENOUS | Status: DC
  Administered 2023-11-18 – 2023-11-19 (×2): 2 g via INTRAVENOUS
  Filled 2023-11-18 (×3): qty 20

## 2023-11-18 MED ORDER — SODIUM CHLORIDE 0.9 % IV SOLN
500.0000 mg | INTRAVENOUS | Status: DC
  Administered 2023-11-18 – 2023-11-19 (×2): 500 mg via INTRAVENOUS
  Filled 2023-11-18 (×3): qty 5

## 2023-11-18 MED ORDER — LOSARTAN POTASSIUM 50 MG PO TABS
100.0000 mg | ORAL_TABLET | Freq: Every day | ORAL | Status: DC
  Administered 2023-11-18 – 2023-11-20 (×3): 100 mg via ORAL
  Filled 2023-11-18 (×3): qty 2

## 2023-11-18 MED ORDER — BUSPIRONE HCL 10 MG PO TABS
5.0000 mg | ORAL_TABLET | Freq: Two times a day (BID) | ORAL | Status: DC
  Administered 2023-11-18 – 2023-11-20 (×5): 5 mg via ORAL
  Filled 2023-11-18 (×5): qty 1

## 2023-11-18 MED ORDER — SODIUM BICARBONATE 650 MG PO TABS
650.0000 mg | ORAL_TABLET | ORAL | Status: DC
  Administered 2023-11-19: 650 mg via ORAL
  Filled 2023-11-18: qty 1

## 2023-11-18 MED ORDER — CHLORHEXIDINE GLUCONATE CLOTH 2 % EX PADS
6.0000 | MEDICATED_PAD | Freq: Every day | CUTANEOUS | Status: DC
  Administered 2023-11-18: 6 via TOPICAL
  Filled 2023-11-18 (×3): qty 6

## 2023-11-18 MED ORDER — SODIUM ZIRCONIUM CYCLOSILICATE 10 G PO PACK
10.0000 g | PACK | Freq: Three times a day (TID) | ORAL | Status: AC
  Filled 2023-11-18 (×2): qty 1

## 2023-11-18 MED ORDER — ACETAMINOPHEN 325 MG PO TABS
ORAL_TABLET | ORAL | Status: AC
  Filled 2023-11-18: qty 2

## 2023-11-18 MED ORDER — FLUTICASONE PROPIONATE 50 MCG/ACT NA SUSP: 2.0000 | Freq: Every day | NASAL | Status: DC | PRN

## 2023-11-18 MED ORDER — CYCLOBENZAPRINE HCL 10 MG PO TABS
10.0000 mg | ORAL_TABLET | Freq: Every evening | ORAL | Status: DC | PRN
Start: 2023-11-18 — End: 2023-11-20

## 2023-11-18 NOTE — Progress Notes (Signed)
 [facility] Progress Note  Patient: Jessica Patterson    FMW:[id number]  DOA: 11/17/2023     Date of Service: the patient was seen and examined on 11/18/2023  Chief Complaint  Patient presents with   Respiratory Distress   Brief hospital course: Jessica Patterson is a 63 y.o. female with medical history significant for ESRD on HD TTS, started hemodialysis in January 2025 followed by nephrology at [facility], uncontrolled hypertension with multiple allergies to antihypertensives, who presents to the ER with complaints of sudden onset shortness of breath today.  Last hemodialysis was on Saturday, completed 3 hours session.  States she has allergies to antihypertensives and has been started on oral antihypertensives 1 at the time to identify her allergies.  She was on losartan and furosemide prior to admission.   Upon presentation to the ER, the patient was severely hypertensive with SBP in the 220s and DBP's in the 150s.  This was improved with nitro drip.  Initially requiring BiPAP due to flash pulmonary edema seen on chest x-ray.   The patient was additionally started on IV Lasix.  Admitted by [facility], hospitalist service.   ED Course: Temperature 97.8.  BP 160/94, pulse 89, respiration rate 21, O2 saturation 100% on 6 L.     Assessment and Plan:  # Acute hypoxic respiratory failure secondary to flash pulmonary edema Presented with SBP in the 220s, DBP in the 150s, pulmonary edema seen on chest x-ray. Initially requiring BiPAP, weaned off to 6 L high flow nasal cannula. Continue to wean off O2 supplementation as tolerated Not on oxygen supplementation at baseline Incentive spirometer Early mobilization. 10/13 respiratory failure resolved, currently saturating well on room air.  Improved after hemodialysis  # Hypertensive emergency SBP 220 on admission, patient was on nitro infusion 10/13 resumed losartan 100 mg p.o. daily and bisoprolol 5 mg p.o. daily Monitor BP and titrate  medications accordingly   # Elevated troponin, likely demand ischemia in the setting of acute hypoxic respiratory failure  Flash pulmonary edema BNP greater than 3400 Follow limited 2D echo Closely monitor on telemetry.   Possible CAP, POA Could not be excluded- bilateral lower lobe pneumonia from chest x-ray Received Rocephin and IV azithromycin in the ER Obtain baseline procalcitonin level Continue antibiotics until active infective process is ruled out.   ESRD on HD TTS Continue IV Lasix Last hemodialysis was Saturday, 11/16/2023. 10/13 s/p hemodialysis today, next hemodialysis will be tomorrow. Nephrology consult appreciated   Anemia of chronic disease Hemoglobin at baseline Monitor for now.     Body mass index is 22.73 kg/m.  Interventions:  Diet: Renal/carb modified diet DVT Prophylaxis: Subcutaneous Heparin     Advance goals of care discussion: Full code  Family Communication: family was not present at bedside, at the time of interview.  The pt provided permission to discuss medical plan with the family. Opportunity was given to ask question and all questions were answered satisfactorily.   Disposition:  Pt is from Home, admitted with respiratory failure, flash pulmonary edema volume overload, hypertensive urgency, still has high blood pressure, needs hemodialysis tomorrow a.m., which precludes a safe discharge. Discharge to home, when stable, most likely tomorrow a.m. after hemodialysis if remains stable.  Subjective: No significant events overnight.  Patient was seen during hemodialysis, saturating well on room air without any respite distress.  Shortness of breath improved, patient denied any chest pain or palpitation, no any other active issues. Patient agreed to restart her antihypertensive medications today.   Physical  Exam: General: NAD, lying comfortably Appear in no distress, affect appropriate Eyes: PERRLA ENT: Oral Mucosa Clear, moist  Neck: no JVD,   Cardiovascular: S1 and S2 Present, no Murmur,  Respiratory: good respiratory effort, Bilateral Air entry equal and Decreased, mild Crackles, no wheezes Abdomen: Bowel Sound present, Soft and no tenderness,  Skin: no rashes Extremities: no Pedal edema, no calf tenderness Neurologic: without any new focal findings Gait not checked due to patient safety concerns  Vitals:   11/18/23 1337 11/18/23 1413 11/18/23 1445 11/18/23 1455  BP: (!) 175/70  (!) 152/73 (!) 152/73  Pulse: 82   82  Resp: 19  19 19   Temp: 99 F (37.2 C)   98.5 F (36.9 C)  TempSrc: Oral   Oral  SpO2: 95%   96%  Weight:  52.8 kg    Height:        Intake/Output Summary (Last 24 hours) at 11/18/2023 1547 Last data filed at 11/18/2023 1337 Gross per 24 hour  Intake --  Output 2150 ml  Net -2150 ml   Filed Weights   11/17/23 2021 11/18/23 1018 11/18/23 1413  Weight: 58 kg 55 kg 52.8 kg    Data Reviewed: I have personally reviewed and interpreted daily labs, tele strips, imagings as discussed above. I reviewed all nursing notes, pharmacy notes, vitals, pertinent old records I have discussed plan of care as described above with RN and patient/family.  CBC: Recent Labs  Lab 11/15/23 1024 11/17/23 2024 11/18/23 0513  WBC 6.8 13.3* 9.7  NEUTROABS 3.7 8.7*  --   HGB 9.0* 9.9* 7.8*  HCT 28.2* 31.5* 25.1*  MCV 96.6 98.7 98.0  PLT 185 254 186   Basic Metabolic Panel: Recent Labs  Lab 11/17/23 2024 11/18/23 0513  NA 138 140  K 4.4 5.6*  CL 102 103  CO2 21* 19*  GLUCOSE 145* 97  BUN 48* 52*  CREATININE 8.07* 8.58*  CALCIUM 8.6* 8.1*  PHOS  --  5.9*    Studies: DG Chest Port 1 View Result Date: 11/17/2023 CLINICAL DATA:  Respiratory distress EXAM: PORTABLE CHEST 1 VIEW COMPARISON:  11/08/2023 FINDINGS: Heart is borderline in size. Mediastinal contours within normal limits. Extensive bilateral perihilar and lower lobe airspace opacities, similar prior study. No effusions. No acute bony abnormality.  IMPRESSION: Bilateral extensive airspace opacities in the perihilar and lower lobe regions could reflect edema or infection. Findings similar to prior study. Electronically Signed   By: Franky Crease M.D.   On: 11/17/2023 20:48    Scheduled Meds:  bisoprolol  5 mg Oral Daily   busPIRone  5 mg Oral BID   Chlorhexidine  Gluconate Cloth  6 each Topical Q0600   heparin   5,000 Units Subcutaneous Q8H   losartan  100 mg Oral Daily   [START ON 11/19/2023] sodium bicarbonate   650 mg Oral Once per day on Tuesday Saturday   sodium zirconium cyclosilicate  10 g Oral TID   Continuous Infusions:  azithromycin     cefTRIAXone (ROCEPHIN)  IV     nitroGLYCERIN Stopped (11/17/23 2136)   PRN Meds: acetaminophen , cyclobenzaprine , fluticasone, hydrOXYzine, melatonin, polyethylene glycol, prochlorperazine  Time spent: 55 minutes  Author: ELVAN SOR. MD [facility] 11/18/2023 3:47 PM  To reach On-call, see care teams to locate the attending and reach out to them via [email or web address]. If 7PM-7AM, please contact night-coverage If you still have difficulty reaching the attending provider, please page the [facility] (Director on Call) for [facility] on amion for assistance.

## 2023-11-18 NOTE — Progress Notes (Signed)
 [facility] [place], [place] 11/18/23  Subjective:   Hospital day # 1  Patient known to our practice from outpatient dialysis last dialysis treatment was on October 11.  He presented by EMS for severe shortness of breath after grocery shopping.  Denies missing any recent dialysis treatments. Was increased to three treatments a week during previous admission. Patient seen laying on stretcher, ill appearing, frail.   Blood pressure elevated on ED arrival, 166/136. BNP 3461, troponin 46, lactic acid 2.0, white count 13.3 with Hgb 9.9. Respiratory panel negative. Chest xray shows perihilar opacities that could represent edema or infection. ECHO pending.   Objective:  Vital signs in last 24 hours:  Temp:  [97.6 F (36.4 C)-98.7 F (37.1 C)] 98.7 F (37.1 C) (10/13 1003) Pulse Rate:  [79-117] 88 (10/13 1035) Resp:  [17-35] 19 (10/13 1035) BP: (113-183)/(73-136) 183/88 (10/13 1035) SpO2:  [90 %-100 %] 96 % (10/13 1035) FiO2 (%):  [50 %-100 %] 50 % (10/12 2137) Weight:  [55 kg-58 kg] 55 kg (10/13 1018)  Weight change:  Filed Weights   11/17/23 2021 11/18/23 1018  Weight: 58 kg 55 kg    Intake/Output:    Intake/Output Summary (Last 24 hours) at 11/18/2023 1101 Last data filed at 11/18/2023 9371 Gross per 24 hour  Intake --  Output 150 ml  Net -150 ml     Physical Exam: General: No acute distress, laying in the bed  HEENT Moist oral mucous membranes  Pulm/lungs [place] O2, normal breathing effort, coarse breath sounds  CVS/Heart Regular rhythm  Abdomen:  Nontender nondistended  Extremities: No significant edema  Neurologic: Alert, oriented  Skin: No acute rashes  Access: Left upper extremity AV graft       Basic Metabolic Panel:  Recent Labs  Lab 11/17/23 2024 11/18/23 0513  NA 138 140  K 4.4 5.6*  CL 102 103  CO2 21* 19*  GLUCOSE 145* 97  BUN 48* 52*  CREATININE 8.07* 8.58*  CALCIUM 8.6* 8.1*  PHOS  --  5.9*     CBC: Recent Labs  Lab  11/15/23 1024 11/17/23 2024 11/18/23 0513  WBC 6.8 13.3* 9.7  NEUTROABS 3.7 8.7*  --   HGB 9.0* 9.9* 7.8*  HCT 28.2* 31.5* 25.1*  MCV 96.6 98.7 98.0  PLT 185 254 186      Lab Results  Component Value Date   HEPBSAG NON REACTIVE 11/09/2023      Microbiology:  Recent Results (from the past 240 hours)  Resp panel by RT-PCR (RSV, Flu A&B, Covid) Anterior Nasal Swab     Status: None   Collection Time: 11/08/23 11:17 PM   Specimen: Anterior Nasal Swab  Result Value Ref Range Status   SARS Coronavirus 2 by RT PCR NEGATIVE NEGATIVE Final    Comment: (NOTE) SARS-CoV-2 target nucleic acids are NOT DETECTED.  The SARS-CoV-2 RNA is generally detectable in upper respiratory specimens during the acute phase of infection. The lowest concentration of SARS-CoV-2 viral copies this assay can detect is 138 copies/mL. A negative result does not preclude SARS-Cov-2 infection and should not be used as the sole basis for treatment or other patient management decisions. A negative result may occur with  improper specimen collection/handling, submission of specimen other than nasopharyngeal swab, presence of viral mutation(s) within the areas targeted by this assay, and inadequate number of viral copies(<138 copies/mL). A negative result must be combined with clinical observations, patient history, and epidemiological information. The expected result is Negative.  Fact Sheet  for Patients:  BloggerCourse.com  Fact Sheet for Healthcare Providers:  SeriousBroker.it  This test is no t yet approved or cleared by the [place]  FDA and  has been authorized for detection and/or diagnosis of SARS-CoV-2 by FDA under an Emergency Use Authorization (EUA). This EUA will remain  in effect (meaning this test can be used) for the duration of the COVID-19 declaration under Section 564(b)(1) of the Act, 21 U.S.C.section 360bbb-3(b)(1), unless the  authorization is terminated  or revoked sooner.       Influenza A by PCR NEGATIVE NEGATIVE Final   Influenza B by PCR NEGATIVE NEGATIVE Final    Comment: (NOTE) The Xpert Xpress SARS-CoV-2/FLU/RSV plus assay is intended as an aid in the diagnosis of influenza from Nasopharyngeal swab specimens and should not be used as a sole basis for treatment. Nasal washings and aspirates are unacceptable for Xpert Xpress SARS-CoV-2/FLU/RSV testing.  Fact Sheet for Patients: BloggerCourse.com  Fact Sheet for Healthcare Providers: SeriousBroker.it  This test is not yet approved or cleared by the [place]  FDA and has been authorized for detection and/or diagnosis of SARS-CoV-2 by FDA under an Emergency Use Authorization (EUA). This EUA will remain in effect (meaning this test can be used) for the duration of the COVID-19 declaration under Section 564(b)(1) of the Act, 21 U.S.C. section 360bbb-3(b)(1), unless the authorization is terminated or revoked.     Resp Syncytial Virus by PCR NEGATIVE NEGATIVE Final    Comment: (NOTE) Fact Sheet for Patients: BloggerCourse.com  Fact Sheet for Healthcare Providers: SeriousBroker.it  This test is not yet approved or cleared by the [place]  FDA and has been authorized for detection and/or diagnosis of SARS-CoV-2 by FDA under an Emergency Use Authorization (EUA). This EUA will remain in effect (meaning this test can be used) for the duration of the COVID-19 declaration under Section 564(b)(1) of the Act, 21 U.S.C. section 360bbb-3(b)(1), unless the authorization is terminated or revoked.  Performed at [facility], [address] [place], [place] [address]   Resp panel by RT-PCR (RSV, Flu A&B, Covid) Anterior Nasal Swab     Status: None   Collection Time: 11/17/23  8:42 PM   Specimen: Anterior Nasal Swab  Result Value Ref Range  Status   SARS Coronavirus 2 by RT PCR NEGATIVE NEGATIVE Final    Comment: (NOTE) SARS-CoV-2 target nucleic acids are NOT DETECTED.  The SARS-CoV-2 RNA is generally detectable in upper respiratory specimens during the acute phase of infection. The lowest concentration of SARS-CoV-2 viral copies this assay can detect is 138 copies/mL. A negative result does not preclude SARS-Cov-2 infection and should not be used as the sole basis for treatment or other patient management decisions. A negative result may occur with  improper specimen collection/handling, submission of specimen other than nasopharyngeal swab, presence of viral mutation(s) within the areas targeted by this assay, and inadequate number of viral copies(<138 copies/mL). A negative result must be combined with clinical observations, patient history, and epidemiological information. The expected result is Negative.  Fact Sheet for Patients:  BloggerCourse.com  Fact Sheet for Healthcare Providers:  SeriousBroker.it  This test is no t yet approved or cleared by the [place]  FDA and  has been authorized for detection and/or diagnosis of SARS-CoV-2 by FDA under an Emergency Use Authorization (EUA). This EUA will remain  in effect (meaning this test can be used) for the duration of the COVID-19 declaration under Section 564(b)(1) of the Act, 21 U.S.C.section 360bbb-3(b)(1), unless the authorization is terminated  or revoked sooner.       Influenza A by PCR NEGATIVE NEGATIVE Final   Influenza B by PCR NEGATIVE NEGATIVE Final    Comment: (NOTE) The Xpert Xpress SARS-CoV-2/FLU/RSV plus assay is intended as an aid in the diagnosis of influenza from Nasopharyngeal swab specimens and should not be used as a sole basis for treatment. Nasal washings and aspirates are unacceptable for Xpert Xpress SARS-CoV-2/FLU/RSV testing.  Fact Sheet for  Patients: BloggerCourse.com  Fact Sheet for Healthcare Providers: SeriousBroker.it  This test is not yet approved or cleared by the [place]  FDA and has been authorized for detection and/or diagnosis of SARS-CoV-2 by FDA under an Emergency Use Authorization (EUA). This EUA will remain in effect (meaning this test can be used) for the duration of the COVID-19 declaration under Section 564(b)(1) of the Act, 21 U.S.C. section 360bbb-3(b)(1), unless the authorization is terminated or revoked.     Resp Syncytial Virus by PCR NEGATIVE NEGATIVE Final    Comment: (NOTE) Fact Sheet for Patients: BloggerCourse.com  Fact Sheet for Healthcare Providers: SeriousBroker.it  This test is not yet approved or cleared by the [place]  FDA and has been authorized for detection and/or diagnosis of SARS-CoV-2 by FDA under an Emergency Use Authorization (EUA). This EUA will remain in effect (meaning this test can be used) for the duration of the COVID-19 declaration under Section 564(b)(1) of the Act, 21 U.S.C. section 360bbb-3(b)(1), unless the authorization is terminated or revoked.  Performed at [facility], [address] [place], [place] [address]   Blood Culture (routine x 2)     Status: None (Preliminary result)   Collection Time: 11/17/23  8:42 PM   Specimen: BLOOD  Result Value Ref Range Status   Specimen Description BLOOD BLOOD RIGHT ARM  Final   Special Requests   Final    BOTTLES DRAWN AEROBIC AND ANAEROBIC Blood Culture results may not be optimal due to an inadequate volume of blood received in culture bottles   Culture   Final    NO GROWTH < 12 HOURS Performed at [facility], [address], [place], [place] [address]    Report Status PENDING  Incomplete  Blood Culture (routine x 2)     Status: None (Preliminary result)   Collection Time: 11/17/23   8:42 PM   Specimen: BLOOD  Result Value Ref Range Status   Specimen Description BLOOD BLOOD RIGHT HAND  Final   Special Requests   Final    BOTTLES DRAWN AEROBIC AND ANAEROBIC Blood Culture results may not be optimal due to an inadequate volume of blood received in culture bottles   Culture   Final    NO GROWTH < 12 HOURS Performed at [facility], [address], [place], [place] [address]    Report Status PENDING  Incomplete    Coagulation Studies: Recent Labs    11/17/23 Dec 23, 2022  LABPROT 13.6  INR 1.0    Urinalysis: Recent Labs    11/18/23 0621  COLORURINE AMBER*  LABSPEC 1.011  PHURINE 9.0*  GLUCOSEU 50*  HGBUR NEGATIVE  BILIRUBINUR NEGATIVE  KETONESUR 5*  PROTEINUR 100*  NITRITE NEGATIVE  LEUKOCYTESUR SMALL*      Imaging: DG Chest Port 1 View Result Date: 11/17/2023 CLINICAL DATA:  Respiratory distress EXAM: PORTABLE CHEST 1 VIEW COMPARISON:  11/08/2023 FINDINGS: Heart is borderline in size. Mediastinal contours within normal limits. Extensive bilateral perihilar and lower lobe airspace opacities, similar prior study. No effusions. No acute bony abnormality. IMPRESSION: Bilateral  extensive airspace opacities in the perihilar and lower lobe regions could reflect edema or infection. Findings similar to prior study. Electronically Signed   By: Franky Crease M.D.   On: 11/17/2023 20:48     Medications:    azithromycin     cefTRIAXone (ROCEPHIN)  IV     nitroGLYCERIN Stopped (11/17/23 2136)    Chlorhexidine  Gluconate Cloth  6 each Topical Q0600   enalaprilat  1.25 mg Intravenous Once   heparin   5,000 Units Subcutaneous Q8H   sodium zirconium cyclosilicate  10 g Oral TID   acetaminophen , melatonin, polyethylene glycol, prochlorperazine  Assessment/ Plan:  63 y.o. female with end-stage renal disease, severe hypertension, heart failure with reduced ejection fraction  admitted on 11/17/2023 for Flash pulmonary edema (HCC) [J81.0] Acute respiratory  failure with hypoxia (HCC) [J96.01] ESRD on hemodialysis (HCC) [N18.6, Z99.2] Acute hypoxic respiratory failure (HCC) [J96.01]   [facility]/53.5 kg/TTS/left upper extremity AV graft  End-stage renal disease, with shortness of breath Likely caused by severe hypertension which is uncontrolled. Patient was recently evaluated by cardiologist as outpatient on 11/06/2023.  Amlodipine was discontinued and patient was started on losartan 50 mg once a day.  She was also to be started on bisoprolol once daily in 2 weeks.  A repeat 2D echo is pending. Will receive extra dialysis treatment today with UF goal 1.5L as tolerated.    Hypertension with chronic kidney disease Outpatient regimen includes bisoprolol and losartan. She had not started taking bisoprolol even though was instructed to start taking it at previous discharge. Both currently held. Has received Furosemide 160mg  and enalaprilat during this admission.   Anemia in chronic kidney disease Denies signs of bleeding. Hgb 7.8 today. Will ESA today due to elevated BP  Secondary Hyperparathyroidism: with outpatient labs: PTH 549, phosphorus 7.5, calcium 7.8 on 10/15/23.   Lab Results  Component Value Date   CALCIUM 8.1 (L) 11/18/2023   CAION 1.09 (L) 05/31/2023   PHOS 5.9 (H) 11/18/2023    Will continue to monitor bone minerals.   Anxiety Patient mentioned that she was not taking buspirone on a regular basis.  Encouraged her to take this regularly so that anxiety does not exacerbate her blood pressure.     LOS: 1 [facility] 10/13/202511:01 AM  [address] [place], [place] [phone]

## 2023-11-18 NOTE — ED Notes (Signed)
 Morning labs drawn. Meds given. Patient noted to have removed nasal cannula from face. O2 sats normal without increased work of breathing at this time. VSS, CCM in use, call light within reach.

## 2023-11-18 NOTE — Progress Notes (Addendum)
 Pt receives out-pt HD at [facility] on T/S 10 am schedule. Navigator following to assist with any HD needs.   Suzen Satchel Dialysis Navigator [email or web address]

## 2023-11-18 NOTE — Progress Notes (Addendum)
  Received patient in bed to unit.   Informed consent signed and in chart.    TX duration:3 hrs     Transported by  Hand-off given to patient's nurse.    Access used: Left upper graft Access issues: none   Total UF removed: 2000 L Medication(s) given: Tylenol  650 mgs Post HD VS: wnl Post HD weight: 52.8 kg     N. Tiffany Calmes LPN [facility]

## 2023-11-18 NOTE — ED Notes (Signed)
 Patient ambulatory to bedside toilet and back to ER stretcher without incident. Reconnected to monitor, VSS, call light within reach.

## 2023-11-19 DIAGNOSIS — J9601 Acute respiratory failure with hypoxia: Secondary | ICD-10-CM | POA: Diagnosis not present

## 2023-11-19 LAB — MAGNESIUM: Magnesium: 2.4 mg/dL (ref 1.7–2.4)

## 2023-11-19 LAB — CBC
HCT: 26.7 % — ABNORMAL LOW (ref 36.0–46.0)
Hemoglobin: 8.6 g/dL — ABNORMAL LOW (ref 12.0–15.0)
MCH: 30.9 pg (ref 26.0–34.0)
MCHC: 32.2 g/dL (ref 30.0–36.0)
MCV: 96 fL (ref 80.0–100.0)
Platelets: 217 K/uL (ref 150–400)
RBC: 2.78 MIL/uL — ABNORMAL LOW (ref 3.87–5.11)
RDW: 16 % — ABNORMAL HIGH (ref 11.5–15.5)
WBC: 5.1 K/uL (ref 4.0–10.5)
nRBC: 0 % (ref 0.0–0.2)

## 2023-11-19 LAB — PHOSPHORUS: Phosphorus: 5.5 mg/dL — ABNORMAL HIGH (ref 2.5–4.6)

## 2023-11-19 LAB — BASIC METABOLIC PANEL WITH GFR
Anion gap: 13 (ref 5–15)
BUN: 32 mg/dL — ABNORMAL HIGH (ref 8–23)
CO2: 25 mmol/L (ref 22–32)
Calcium: 8.7 mg/dL — ABNORMAL LOW (ref 8.9–10.3)
Chloride: 98 mmol/L (ref 98–111)
Creatinine, Ser: 5.81 mg/dL — ABNORMAL HIGH (ref 0.44–1.00)
GFR, Estimated: 8 mL/min — ABNORMAL LOW (ref >60)
Glucose, Bld: 98 mg/dL (ref 70–99)
Potassium: 4.6 mmol/L (ref 3.5–5.1)
Sodium: 136 mmol/L (ref 135–145)

## 2023-11-19 LAB — VITAMIN D 25 HYDROXY (VIT D DEFICIENCY, FRACTURES): Vit D, 25-Hydroxy: 39.28 ng/mL (ref 30–100)

## 2023-11-19 LAB — VITAMIN B12: Vitamin B-12: 369 pg/mL (ref 180–914)

## 2023-11-19 MED ORDER — ISOSORBIDE MONONITRATE ER 60 MG PO TB24: 60.0000 mg | ORAL_TABLET | Freq: Every day | ORAL | Status: DC

## 2023-11-19 MED ORDER — BISOPROLOL FUMARATE 5 MG PO TABS
10.0000 mg | ORAL_TABLET | Freq: Every day | ORAL | Status: DC
Start: 2023-11-19 — End: 2023-11-20
  Administered 2023-11-19: 10 mg via ORAL
  Filled 2023-11-19 (×2): qty 2

## 2023-11-19 MED ORDER — BISOPROLOL FUMARATE 5 MG PO TABS
5.0000 mg | ORAL_TABLET | Freq: Every day | ORAL | Status: DC
  Filled 2023-11-19: qty 1

## 2023-11-19 MED ORDER — ISOSORBIDE MONONITRATE ER 60 MG PO TB24
60.0000 mg | ORAL_TABLET | Freq: Every evening | ORAL | Status: DC
  Filled 2023-11-19: qty 1

## 2023-11-19 NOTE — ED Notes (Signed)
 Pt complaining about taking meds on empty stomach, missing her bracelet from last night, etc. Applesauce was provided to pt and dietary was called.

## 2023-11-19 NOTE — ED Notes (Signed)
 Sent med message for Zebeta from pharmacy.

## 2023-11-19 NOTE — ED Notes (Signed)
 Pt expressing frustration about they lost my bracelet when she first came in and I need it back, it's very expensive. Can I go over there and ask? And also that she doesn't understand why she has to get dialysis again today because she had it yesterday. Told her will ask ED secretary and pt relations about the bracelet and that no, she cannot walk over to the other side of the ED to look for her bracelet. Told her that probably they are trying to get her on her regular schedule and that since they took off 2L yesterday they probably won't take so much off today. Pt not content. Going to dialysis now.

## 2023-11-19 NOTE — ED Notes (Signed)
 Spoke w ED registration, they will put pt chart back on the board when she returns to room 30 from dialysis.

## 2023-11-19 NOTE — ED Notes (Signed)
 Pt has dietary tray. Pt on phone. Hospitalist was at bedside.

## 2023-11-19 NOTE — ED Notes (Signed)
 Called dietary to bring new food tray. Pt also wants coffee. They will bring.

## 2023-11-19 NOTE — ED Notes (Signed)
 Called dialysis, pt was moved OTF but is coming back to room 30. All her belongings still in the room also. Trying to get pt back on the board.

## 2023-11-19 NOTE — Progress Notes (Signed)
 [facility] [place], [place] 11/19/23  Subjective:   Hospital day # 1  Patient known to our practice from outpatient dialysis last dialysis treatment was on October 11.  He presented by EMS for severe shortness of breath after grocery shopping.  Denies missing any recent dialysis treatments.   Patient seen and evaluated during dialysis   HEMODIALYSIS FLOWSHEET:  Blood Flow Rate (mL/min): 299 mL/min Arterial Pressure (mmHg): -137.57 mmHg Venous Pressure (mmHg): 128.27 mmHg TMP (mmHg): 25.25 mmHg Ultrafiltration Rate (mL/min): 691 mL/min Dialysate Flow Rate (mL/min): 300 ml/min  Tolerating treatment well    Objective:  Vital signs in last 24 hours:  Temp:  [98.1 F (36.7 C)-99 F (37.2 C)] 98.4 F (36.9 C) (10/14 0808) Pulse Rate:  [76-87] 80 (10/14 1100) Resp:  [12-25] 17 (10/14 1100) BP: (136-260)/(70-143) 199/85 (10/14 1100) SpO2:  [93 %-100 %] 97 % (10/14 1100) Weight:  [52.8 kg-53 kg] 53 kg (10/14 0806)  Weight change: -3.015 kg Filed Weights   11/18/23 1018 11/18/23 1413 11/19/23 0806  Weight: 55 kg 52.8 kg 53 kg    Intake/Output:    Intake/Output Summary (Last 24 hours) at 11/19/2023 1128 Last data filed at 11/19/2023 0257 Gross per 24 hour  Intake 350 ml  Output 2000 ml  Net -1650 ml     Physical Exam: General: No acute distress, laying in the bed  HEENT Moist oral mucous membranes  Pulm/lungs [place] O2, normal breathing effort, coarse breath sounds  CVS/Heart Regular rhythm  Abdomen:  Nontender nondistended  Extremities: No significant edema  Neurologic: Alert, oriented  Skin: No acute rashes  Access: Left upper extremity AV graft       Basic Metabolic Panel:  Recent Labs  Lab 11/17/23 2024 11/18/23 0513 11/19/23 0516  NA 138 140 136  K 4.4 5.6* 4.6  CL 102 103 98  CO2 21* 19* 25  GLUCOSE 145* 97 98  BUN 48* 52* 32*  CREATININE 8.07* 8.58* 5.81*  CALCIUM 8.6* 8.1* 8.7*  MG  --   --  2.4  PHOS  --  5.9* 5.5*      CBC: Recent Labs  Lab 11/15/23 1024 11/17/23 2024 11/18/23 0513 11/19/23 0516  WBC 6.8 13.3* 9.7 5.1  NEUTROABS 3.7 8.7*  --   --   HGB 9.0* 9.9* 7.8* 8.6*  HCT 28.2* 31.5* 25.1* 26.7*  MCV 96.6 98.7 98.0 96.0  PLT 185 254 186 217      Lab Results  Component Value Date   HEPBSAG NON REACTIVE 11/09/2023      Microbiology:  Recent Results (from the past 240 hours)  Resp panel by RT-PCR (RSV, Flu A&B, Covid) Anterior Nasal Swab     Status: None   Collection Time: 11/17/23  8:42 PM   Specimen: Anterior Nasal Swab  Result Value Ref Range Status   SARS Coronavirus 2 by RT PCR NEGATIVE NEGATIVE Final    Comment: (NOTE) SARS-CoV-2 target nucleic acids are NOT DETECTED.  The SARS-CoV-2 RNA is generally detectable in upper respiratory specimens during the acute phase of infection. The lowest concentration of SARS-CoV-2 viral copies this assay can detect is 138 copies/mL. A negative result does not preclude SARS-Cov-2 infection and should not be used as the sole basis for treatment or other patient management decisions. A negative result may occur with  improper specimen collection/handling, submission of specimen other than nasopharyngeal swab, presence of viral mutation(s) within the areas targeted by this assay, and inadequate number of viral copies(<138 copies/mL). A negative  result must be combined with clinical observations, patient history, and epidemiological information. The expected result is Negative.  Fact Sheet for Patients:  BloggerCourse.com  Fact Sheet for Healthcare Providers:  SeriousBroker.it  This test is no t yet approved or cleared by the [place]  FDA and  has been authorized for detection and/or diagnosis of SARS-CoV-2 by FDA under an Emergency Use Authorization (EUA). This EUA will remain  in effect (meaning this test can be used) for the duration of the COVID-19 declaration under  Section 564(b)(1) of the Act, 21 U.S.C.section 360bbb-3(b)(1), unless the authorization is terminated  or revoked sooner.       Influenza A by PCR NEGATIVE NEGATIVE Final   Influenza B by PCR NEGATIVE NEGATIVE Final    Comment: (NOTE) The Xpert Xpress SARS-CoV-2/FLU/RSV plus assay is intended as an aid in the diagnosis of influenza from Nasopharyngeal swab specimens and should not be used as a sole basis for treatment. Nasal washings and aspirates are unacceptable for Xpert Xpress SARS-CoV-2/FLU/RSV testing.  Fact Sheet for Patients: BloggerCourse.com  Fact Sheet for Healthcare Providers: SeriousBroker.it  This test is not yet approved or cleared by the [place]  FDA and has been authorized for detection and/or diagnosis of SARS-CoV-2 by FDA under an Emergency Use Authorization (EUA). This EUA will remain in effect (meaning this test can be used) for the duration of the COVID-19 declaration under Section 564(b)(1) of the Act, 21 U.S.C. section 360bbb-3(b)(1), unless the authorization is terminated or revoked.     Resp Syncytial Virus by PCR NEGATIVE NEGATIVE Final    Comment: (NOTE) Fact Sheet for Patients: BloggerCourse.com  Fact Sheet for Healthcare Providers: SeriousBroker.it  This test is not yet approved or cleared by the [place]  FDA and has been authorized for detection and/or diagnosis of SARS-CoV-2 by FDA under an Emergency Use Authorization (EUA). This EUA will remain in effect (meaning this test can be used) for the duration of the COVID-19 declaration under Section 564(b)(1) of the Act, 21 U.S.C. section 360bbb-3(b)(1), unless the authorization is terminated or revoked.  Performed at [facility], [address] [place], [place] [address]   Blood Culture (routine x 2)     Status: None (Preliminary result)   Collection Time: 11/17/23  8:42  PM   Specimen: BLOOD  Result Value Ref Range Status   Specimen Description BLOOD BLOOD RIGHT ARM  Final   Special Requests   Final    BOTTLES DRAWN AEROBIC AND ANAEROBIC Blood Culture results may not be optimal due to an inadequate volume of blood received in culture bottles   Culture   Final    NO GROWTH 2 DAYS Performed at [facility], [address], [place], [place] [address]    Report Status PENDING  Incomplete  Blood Culture (routine x 2)     Status: None (Preliminary result)   Collection Time: 11/17/23  8:42 PM   Specimen: BLOOD  Result Value Ref Range Status   Specimen Description BLOOD BLOOD RIGHT HAND  Final   Special Requests   Final    BOTTLES DRAWN AEROBIC AND ANAEROBIC Blood Culture results may not be optimal due to an inadequate volume of blood received in culture bottles   Culture   Final    NO GROWTH 2 DAYS Performed at [facility], [address] [place], [place] [address]    Report Status PENDING  Incomplete    Coagulation Studies: Recent Labs    11/17/23 Dec 28, 2022  LABPROT 13.6  INR  1.0    Urinalysis: Recent Labs    11/18/23 0621  COLORURINE AMBER*  LABSPEC 1.011  PHURINE 9.0*  GLUCOSEU 50*  HGBUR NEGATIVE  BILIRUBINUR NEGATIVE  KETONESUR 5*  PROTEINUR 100*  NITRITE NEGATIVE  LEUKOCYTESUR SMALL*      Imaging: ECHOCARDIOGRAM LIMITED Result Date: 11/18/2023    ECHOCARDIOGRAM LIMITED REPORT   Patient Name:   Jessica Patterson Date of Exam: 11/18/2023 Medical Rec #:  [id number]      Height:       60.0 in Accession #:    [id number]     Weight:       127.9 lb Date of Birth:  10-03-1960      BSA:          1.544 m Patient Age:    62 years       BP:           155/86 mmHg Patient Gender: F              HR:           91 bpm. Exam Location:  Inpatient Procedure: Limited Echo, Cardiac Doppler and Color Doppler (Both Spectral and            Color Flow Doppler were utilized during procedure). Indications:     CHF  History:         Patient has  prior history of Echocardiogram examinations, most                  recent 09/29/2020. Risk Factors:Hypertension. CKD.  Sonographer:     Philomena Daring Referring Phys:  [id number] TERRY SAILOR HALL Diagnosing Phys: Lonni Hanson MD IMPRESSIONS  1. Left ventricular ejection fraction, by estimation, is 40 to 45%. The left ventricle has mildly decreased function. The left ventricle demonstrates global hypokinesis. There is mild left ventricular hypertrophy. Left ventricular diastolic parameters are consistent with Grade I diastolic dysfunction (impaired relaxation). Elevated left atrial pressure.  2. Right ventricular systolic function is normal. The right ventricular size is normal. There is normal pulmonary artery systolic pressure.  3. A small pericardial effusion is present. The pericardial effusion is circumferential.  4. The mitral valve is normal in structure. Moderate mitral valve regurgitation. No evidence of mitral stenosis.  5. The aortic valve is tricuspid. There is moderate thickening of the aortic valve. Aortic valve sclerosis/calcification is present, without any evidence of aortic stenosis.  6. The inferior vena cava is dilated in size with <50% respiratory variability, suggesting right atrial pressure of 15 mmHg. FINDINGS  Left Ventricle: Left ventricular ejection fraction, by estimation, is 40 to 45%. The left ventricle has mildly decreased function. The left ventricle demonstrates global hypokinesis. The left ventricular internal cavity size was normal in size. There is  mild left ventricular hypertrophy. Left ventricular diastolic parameters are consistent with Grade I diastolic dysfunction (impaired relaxation). Elevated left atrial pressure. Right Ventricle: The right ventricular size is normal. No increase in right ventricular wall thickness. Right ventricular systolic function is normal. There is normal pulmonary artery systolic pressure. The tricuspid regurgitant velocity is 2.14 m/s, and  with an  assumed right atrial pressure of 15 mmHg, the estimated right ventricular systolic pressure is 33.3 mmHg. Pericardium: A small pericardial effusion is present. The pericardial effusion is circumferential. Mitral Valve: The mitral valve is normal in structure. Moderate mitral valve regurgitation. No evidence of mitral valve stenosis. Tricuspid Valve: The tricuspid valve is normal in structure. Tricuspid valve regurgitation is trivial. Aortic Valve: The  aortic valve is tricuspid. There is moderate thickening of the aortic valve. Aortic valve sclerosis/calcification is present, without any evidence of aortic stenosis. Aortic valve mean gradient measures 8.9 mmHg. Aortic valve peak gradient measures 14.8 mmHg. Aortic valve area, by VTI measures 2.15 cm. Pulmonic Valve: The pulmonic valve was normal in structure. Pulmonic valve regurgitation is trivial. No evidence of pulmonic stenosis. Aorta: The aortic root and ascending aorta are structurally normal, with no evidence of dilitation. Pulmonary Artery: The pulmonary artery is of normal size. Venous: The inferior vena cava is dilated in size with less than 50% respiratory variability, suggesting right atrial pressure of 15 mmHg. IAS/Shunts: The interatrial septum was not assessed. Additional Comments: Spectral Doppler performed. Color Doppler performed.  LEFT VENTRICLE PLAX 2D LVIDd:         5.20 cm   Diastology LVIDs:         4.00 cm   LV e' medial:    4.79 cm/s LV PW:         1.30 cm   LV E/e' medial:  26.5 LV IVS:        1.00 cm   LV e' lateral:   7.29 cm/s LVOT diam:     2.00 cm   LV E/e' lateral: 17.4 LV SV:         74 LV SV Index:   48 LVOT Area:     3.14 cm  RIGHT VENTRICLE             IVC RV S prime:     15.30 cm/s  IVC diam: 2.10 cm TAPSE (M-mode): 2.5 cm LEFT ATRIUM         Index LA diam:    4.30 cm 2.79 cm/m  AORTIC VALVE AV Area (Vmax):    2.19 cm AV Area (Vmean):   1.94 cm AV Area (VTI):     2.15 cm AV Vmax:           192.50 cm/s AV Vmean:           141.202 cm/s AV VTI:            0.347 m AV Peak Grad:      14.8 mmHg AV Mean Grad:      8.9 mmHg LVOT Vmax:         134.00 cm/s LVOT Vmean:        87.400 cm/s LVOT VTI:          0.237 m LVOT/AV VTI ratio: 0.68  AORTA Ao Root diam: 2.90 cm MITRAL VALVE                TRICUSPID VALVE MV Area (PHT): 4.80 cm     TR Peak grad:   18.3 mmHg MV Decel Time: 158 msec     TR Vmax:        214.00 cm/s MV E velocity: 127.00 cm/s MV A velocity: 140.00 cm/s  SHUNTS MV E/A ratio:  0.91         Systemic VTI:  0.24 m                             Systemic Diam: 2.00 cm Lonni Hanson MD Electronically signed by Lonni Hanson MD Signature Date/Time: 11/18/2023/6:09:01 PM    Final    DG Chest Port 1 View Result Date: 11/17/2023 CLINICAL DATA:  Respiratory distress EXAM: PORTABLE CHEST 1 VIEW COMPARISON:  11/08/2023 FINDINGS: Heart is borderline in size. Mediastinal contours  within normal limits. Extensive bilateral perihilar and lower lobe airspace opacities, similar prior study. No effusions. No acute bony abnormality. IMPRESSION: Bilateral extensive airspace opacities in the perihilar and lower lobe regions could reflect edema or infection. Findings similar to prior study. Electronically Signed   By: Franky Crease M.D.   On: 11/17/2023 20:48     Medications:    azithromycin Stopped (11/19/23 0257)   cefTRIAXone (ROCEPHIN)  IV Stopped (11/18/23 2314)   nitroGLYCERIN Stopped (11/17/23 2136)    bisoprolol  10 mg Oral Daily   busPIRone  5 mg Oral BID   Chlorhexidine  Gluconate Cloth  6 each Topical Q0600   heparin   5,000 Units Subcutaneous Q8H   losartan  100 mg Oral Daily   sodium bicarbonate   650 mg Oral Once per day on Tuesday Saturday   acetaminophen , cyclobenzaprine , fluticasone, hydrOXYzine, melatonin, polyethylene glycol, prochlorperazine  Assessment/ Plan:  63 y.o. female with end-stage renal disease, severe hypertension, heart failure with reduced ejection fraction  admitted on 11/17/2023 for Flash  pulmonary edema (HCC) [J81.0] Acute respiratory failure with hypoxia (HCC) [J96.01] ESRD on hemodialysis (HCC) [N18.6, Z99.2] Acute hypoxic respiratory failure (HCC) [J96.01]   [facility]/53.5 kg/TTS/left upper extremity AV graft  End-stage renal disease, with shortness of breath Likely caused by severe hypertension which is uncontrolled. Patient was recently evaluated by cardiologist as outpatient on 11/06/2023.  Amlodipine was discontinued and patient was started on losartan 50 mg once a day.  She was also to be started on bisoprolol once daily in 2 weeks.  ECHO from 10/13 shows EF 40-45% with a grade 1 DD. Received dialysis yesterday with UF 2L achieved. Will receive dialysis today with UF goal 1.5L as tolerated. Next treatment scheduled for Thursday.    Hypertension with chronic kidney disease Outpatient regimen includes bisoprolol and losartan. She had not started taking bisoprolol even though was instructed to start taking it at previous discharge. Has received Furosemide 160mg  and enalaprilat during this admission.   Antihypertensives have been restarted. Will order losartan for morning and bisoprolol for evening dosing.   Anemia in chronic kidney disease Denies signs of bleeding. Hgb 8.6  Secondary Hyperparathyroidism: with outpatient labs: PTH 549, phosphorus 7.5, calcium 7.8 on 10/15/23.   Lab Results  Component Value Date   CALCIUM 8.7 (L) 11/19/2023   CAION 1.09 (L) 05/31/2023   PHOS 5.5 (H) 11/19/2023    Phos slowly improving. Will continue to monitor.   Anxiety Patient mentioned that she was not taking buspirone on a regular basis.  Encouraged her to take this regularly so that anxiety does not exacerbate her blood pressure.     LOS: 1 Faith Harris 10/14/202511:28 AM  [address] [place], [place] [phone]

## 2023-11-19 NOTE — Progress Notes (Signed)
 [facility] Progress Note  Patient: Jessica Patterson    FMW:[id number]  DOA: 11/17/2023     Date of Service: the patient was seen and examined on 11/19/2023  Chief Complaint  Patient presents with   Respiratory Distress   Brief hospital course: TRANY CHERNICK is a 63 y.o. female with medical history significant for ESRD on HD TTS, started hemodialysis in January 2025 followed by nephrology at [facility], uncontrolled hypertension with multiple allergies to antihypertensives, who presents to the ER with complaints of sudden onset shortness of breath today.  Last hemodialysis was on Saturday, completed 3 hours session.  States she has allergies to antihypertensives and has been started on oral antihypertensives 1 at the time to identify her allergies.  She was on losartan and furosemide prior to admission.   Upon presentation to the ER, the patient was severely hypertensive with SBP in the 220s and DBP's in the 150s.  This was improved with nitro drip.  Initially requiring BiPAP due to flash pulmonary edema seen on chest x-ray.   The patient was additionally started on IV Lasix.  Admitted by [facility], hospitalist service.   ED Course: Temperature 97.8.  BP 160/94, pulse 89, respiration rate 21, O2 saturation 100% on 6 L.     Assessment and Plan:  # Acute hypoxic respiratory failure secondary to flash pulmonary edema Presented with SBP in the 220s, DBP in the 150s, pulmonary edema seen on chest x-ray. Initially requiring BiPAP, weaned off to 6 L high flow nasal cannula. Continue to wean off O2 supplementation as tolerated Not on oxygen supplementation at baseline Incentive spirometer Early mobilization. 10/13 respiratory failure resolved, currently saturating well on room air.  Improved after hemodialysis  # Hypertensive emergency SBP 220 on admission, patient was on nitro infusion 10/13 resumed losartan 100 mg p.o. daily 10/14 increased bisoprolol 10 mg p.o. daily 10/14  started Imdur 60 mg p.o. every evening Monitor BP and titrate medications accordingly   # Elevated troponin, likely demand ischemia in the setting of acute hypoxic respiratory failure  Flash pulmonary edema BNP greater than 3400 Follow limited 2D echo Closely monitor on telemetry.   Possible CAP, POA Could not be excluded- bilateral lower lobe pneumonia from chest x-ray Received Rocephin and IV azithromycin in the ER Obtain baseline procalcitonin level Continue antibiotics until active infective process is ruled out.   ESRD on HD TTS Continue IV Lasix Last hemodialysis was Saturday, 11/16/2023. 10/13 s/p hemodialysis today, next hemodialysis will be tomorrow. Nephrology consult appreciated   Anemia of chronic disease Hemoglobin at baseline Monitor for now.     Body mass index is 22.82 kg/m.  Interventions:  Diet: Renal/carb modified diet DVT Prophylaxis: Subcutaneous Heparin     Advance goals of care discussion: Full code  Family Communication: family was not present at bedside, at the time of interview.  The pt provided permission to discuss medical plan with the family. Opportunity was given to ask question and all questions were answered satisfactorily.   Disposition:  Pt is from Home, admitted with respiratory failure, flash pulmonary edema volume overload, hypertensive urgency, still has high blood pressure, needs hemodialysis tomorrow a.m., which precludes a safe discharge. Discharge to home, when stable, most likely tomorrow a.m. if blood pressure remains stable.  Subjective: No significant events overnight.  Patient was seen after hemodialysis, tolerated well.  Still has significantly elevated blood pressure. Patient agreed to titrate medications and start Imdur in the evening. Patient agreed to stay for close monitoring of  her blood pressure and titration of medications  Physical Exam: General: NAD, lying comfortably Appear in no distress, affect  appropriate Eyes: PERRLA ENT: Oral Mucosa Clear, moist  Neck: no JVD,  Cardiovascular: S1 and S2 Present, no Murmur,  Respiratory: good respiratory effort, Bilateral Air entry equal and Decreased, mild Crackles, no wheezes Abdomen: Bowel Sound present, Soft and no tenderness,  Skin: no rashes Extremities: no Pedal edema, no calf tenderness Neurologic: without any new focal findings Gait not checked due to patient safety concerns  Vitals:   11/19/23 1100 11/19/23 1130 11/19/23 1234 11/19/23 1557  BP: (!) 199/85 (!) 194/94 (!) 168/85 (!) 153/88  Pulse: 80 84 85 69  Resp: 17 16 18  (!) 23  Temp:  98.8 F (37.1 C) 98.4 F (36.9 C) 99.2 F (37.3 C)  TempSrc:  Oral Oral Oral  SpO2: 97% 97% 100% 94%  Weight:      Height:        Intake/Output Summary (Last 24 hours) at 11/19/2023 1639 Last data filed at 11/19/2023 1130 Gross per 24 hour  Intake 350 ml  Output 1500 ml  Net -1150 ml   Filed Weights   11/18/23 1018 11/18/23 1413 11/19/23 0806  Weight: 55 kg 52.8 kg 53 kg    Data Reviewed: I have personally reviewed and interpreted daily labs, tele strips, imagings as discussed above. I reviewed all nursing notes, pharmacy notes, vitals, pertinent old records I have discussed plan of care as described above with RN and patient/family.  CBC: Recent Labs  Lab 11/15/23 1024 11/17/23 2024 11/18/23 0513 11/19/23 0516  WBC 6.8 13.3* 9.7 5.1  NEUTROABS 3.7 8.7*  --   --   HGB 9.0* 9.9* 7.8* 8.6*  HCT 28.2* 31.5* 25.1* 26.7*  MCV 96.6 98.7 98.0 96.0  PLT 185 254 186 217   Basic Metabolic Panel: Recent Labs  Lab 11/17/23 2024 11/18/23 0513 11/19/23 0516  NA 138 140 136  K 4.4 5.6* 4.6  CL 102 103 98  CO2 21* 19* 25  GLUCOSE 145* 97 98  BUN 48* 52* 32*  CREATININE 8.07* 8.58* 5.81*  CALCIUM 8.6* 8.1* 8.7*  MG  --   --  2.4  PHOS  --  5.9* 5.5*    Studies: No results found.   Scheduled Meds:  bisoprolol  10 mg Oral Daily   busPIRone  5 mg Oral BID    Chlorhexidine  Gluconate Cloth  6 each Topical Q0600   heparin   5,000 Units Subcutaneous Q8H   losartan  100 mg Oral Daily   sodium bicarbonate   650 mg Oral Once per day on Tuesday Saturday   Continuous Infusions:  azithromycin Stopped (11/19/23 0257)   cefTRIAXone (ROCEPHIN)  IV Stopped (11/18/23 2314)   nitroGLYCERIN Stopped (11/17/23 2136)   PRN Meds: acetaminophen , cyclobenzaprine , fluticasone, hydrOXYzine, melatonin, polyethylene glycol, prochlorperazine  Time spent: 40 minutes  Author: ELVAN SOR. MD [facility] 11/19/2023 4:39 PM  To reach On-call, see care teams to locate the attending and reach out to them via [email or web address]. If 7PM-7AM, please contact night-coverage If you still have difficulty reaching the attending provider, please page the [facility] (Director on Call) for [facility] on amion for assistance.

## 2023-11-19 NOTE — Progress Notes (Signed)
  Received patient in bed to unit.   Informed consent signed and in chart.    TX duration: 3hrs     Transported back to floor  Hand-off given to patient's nurse.  No acute distress noted    Access used: R AVG Access issues: none   Total UF removed: 1.5L Medication(s) given: none Post HD VS: wnl      Olivia Hurst LPN [facility]

## 2023-11-20 ENCOUNTER — Other Ambulatory Visit: Payer: Self-pay

## 2023-11-20 ENCOUNTER — Encounter: Payer: Self-pay | Admitting: Oncology

## 2023-11-20 DIAGNOSIS — J9601 Acute respiratory failure with hypoxia: Secondary | ICD-10-CM | POA: Diagnosis not present

## 2023-11-20 LAB — URINE CULTURE: Culture: >=100000 — AB

## 2023-11-20 LAB — BASIC METABOLIC PANEL WITH GFR
Anion gap: 12 (ref 5–15)
BUN: 30 mg/dL — ABNORMAL HIGH (ref 8–23)
CO2: 26 mmol/L (ref 22–32)
Calcium: 8.7 mg/dL — ABNORMAL LOW (ref 8.9–10.3)
Chloride: 97 mmol/L — ABNORMAL LOW (ref 98–111)
Creatinine, Ser: 5.6 mg/dL — ABNORMAL HIGH (ref 0.44–1.00)
GFR, Estimated: 8 mL/min — ABNORMAL LOW (ref >60)
Glucose, Bld: 97 mg/dL (ref 70–99)
Potassium: 3.8 mmol/L (ref 3.5–5.1)
Sodium: 135 mmol/L (ref 135–145)

## 2023-11-20 LAB — CBC
HCT: 27.3 % — ABNORMAL LOW (ref 36.0–46.0)
Hemoglobin: 8.7 g/dL — ABNORMAL LOW (ref 12.0–15.0)
MCH: 30.7 pg (ref 26.0–34.0)
MCHC: 31.9 g/dL (ref 30.0–36.0)
MCV: 96.5 fL (ref 80.0–100.0)
Platelets: 227 K/uL (ref 150–400)
RBC: 2.83 MIL/uL — ABNORMAL LOW (ref 3.87–5.11)
RDW: 16.1 % — ABNORMAL HIGH (ref 11.5–15.5)
WBC: 4.7 K/uL (ref 4.0–10.5)
nRBC: 0 % (ref 0.0–0.2)

## 2023-11-20 LAB — PHOSPHORUS: Phosphorus: 5.3 mg/dL — ABNORMAL HIGH (ref 2.5–4.6)

## 2023-11-20 LAB — MAGNESIUM: Magnesium: 2.2 mg/dL (ref 1.7–2.4)

## 2023-11-20 MED ORDER — ISOSORBIDE MONONITRATE ER 60 MG PO TB24
60.0000 mg | ORAL_TABLET | Freq: Every evening | ORAL | 11 refills | Status: DC
  Filled 2023-11-20: qty 30, 30d supply, fill #0

## 2023-11-20 MED ORDER — FOLIC ACID 1 MG PO TABS
1.0000 mg | ORAL_TABLET | Freq: Every day | ORAL | Status: DC
  Administered 2023-11-20: 1 mg via ORAL
  Filled 2023-11-20: qty 1

## 2023-11-20 MED ORDER — CYANOCOBALAMIN 1000 MCG PO TABS
1000.0000 ug | ORAL_TABLET | Freq: Every day | ORAL | 0 refills | Status: AC
  Filled 2023-11-20: qty 90, 90d supply, fill #0

## 2023-11-20 MED ORDER — FOLIC ACID 1 MG PO TABS
1.0000 mg | ORAL_TABLET | Freq: Every day | ORAL | 0 refills | Status: AC
  Filled 2023-11-20: qty 30, 30d supply, fill #0
  Filled 2024-02-24: qty 30, 30d supply, fill #1

## 2023-11-20 MED ORDER — PANTOPRAZOLE SODIUM 40 MG PO TBEC
40.0000 mg | DELAYED_RELEASE_TABLET | Freq: Every day | ORAL | Status: DC
  Administered 2023-11-20: 40 mg via ORAL
  Filled 2023-11-20: qty 1

## 2023-11-20 MED ORDER — ISOSORBIDE MONONITRATE ER 60 MG PO TB24
60.0000 mg | ORAL_TABLET | Freq: Every evening | ORAL | Status: DC
  Administered 2023-11-20: 60 mg via ORAL
  Filled 2023-11-20: qty 1

## 2023-11-20 MED ORDER — VITAMIN B-12 1000 MCG PO TABS
1000.0000 ug | ORAL_TABLET | Freq: Every day | ORAL | Status: DC
  Administered 2023-11-20: 1000 ug via ORAL
  Filled 2023-11-20: qty 1

## 2023-11-20 MED ORDER — BISOPROLOL FUMARATE 5 MG PO TABS
10.0000 mg | ORAL_TABLET | Freq: Every day | ORAL | 11 refills | Status: AC
  Filled 2023-11-20: qty 60, 30d supply, fill #0

## 2023-11-20 MED ORDER — BISOPROLOL FUMARATE 5 MG PO TABS
10.0000 mg | ORAL_TABLET | Freq: Every day | ORAL | Status: DC
  Administered 2023-11-20: 10 mg via ORAL
  Filled 2023-11-20: qty 2

## 2023-11-20 MED ORDER — PANTOPRAZOLE SODIUM 40 MG PO TBEC
40.0000 mg | DELAYED_RELEASE_TABLET | Freq: Every day | ORAL | 0 refills | Status: AC
  Filled 2023-11-20: qty 14, 14d supply, fill #0

## 2023-11-20 NOTE — TOC Initial Note (Signed)
 Transition of Care [facility]) - Initial/Assessment Note    Patient Details  Name: Jessica Patterson MRN: [id number] Date of Birth: 11-16-1960  Transition of Care [facility]) CM/SW Contact:    Lauraine JAYSON Carpen, LCSW Phone Number: 11/20/2023, 11:30 AM  Clinical Narrative:   Readmission prevention screen complete. CSW met with patient. No family at bedside. CSW introduced role and explained that discharge planning would be discussed. PCP is Tawni Bramble, MD. Patient drives herself to appointments. [facility] is [facility] [employer] [address] [facility]. No issues affording medications. Patient lives home alone. No home health or DME use prior to admission. Her friend or sister will likely transport her home at discharge. She asked about a cab voucher but CSW explained those are only used as a last resort. Patient has questions regarding Medicare. Sent email to financial counselor asking her to reach out to patient. No further concerns. CSW will continue to follow patient for support and facilitate return home once stable.              Expected Discharge Plan: Home/Self Care Barriers to Discharge: Continued Medical Work up   Patient Goals and CMS Choice            Expected Discharge Plan and Services     Post Acute Care Choice: NA Living arrangements for the past 2 months: Single Family Home                                      Prior Living Arrangements/Services Living arrangements for the past 2 months: Single Family Home Lives with:: Self Patient language and need for interpreter reviewed:: Yes Do you feel safe going back to the place where you live?: Yes      Need for Family Participation in Patient Care: Yes (Comment)     Criminal Activity/Legal Involvement Pertinent to Current Situation/Hospitalization: No - Comment as needed  Activities of Daily Living   ADL Screening (condition at time of admission) Independently performs ADLs?: Yes (appropriate for developmental age) Is the patient  deaf or have difficulty hearing?: No Does the patient have difficulty seeing, even when wearing glasses/contacts?: No Does the patient have difficulty concentrating, remembering, or making decisions?: No  Permission Sought/Granted                  Emotional Assessment Appearance:: Appears stated age Attitude/Demeanor/Rapport: Engaged, Gracious Affect (typically observed): Accepting, Appropriate, Calm, Pleasant Orientation: : Oriented to Self, Oriented to Place, Oriented to  Time, Oriented to Situation Alcohol / Substance Use: Not Applicable Psych Involvement: No (comment)  Admission diagnosis:  Flash pulmonary edema (HCC) [J81.0] Acute respiratory failure with hypoxia (HCC) [J96.01] ESRD on hemodialysis (HCC) [N18.6, Z99.2] Acute hypoxic respiratory failure (HCC) [J96.01] Patient Active Problem List   Diagnosis Date Noted   Acute hypoxic respiratory failure (HCC) 11/17/2023   Fluid overload 11/09/2023   Depression with anxiety 11/09/2023   Hypertensive urgency 11/09/2023   ESRD on dialysis (HCC) 11/09/2023   Leukocytosis 11/09/2023   Acute respiratory failure with hypoxia (HCC) 11/09/2023   Uncontrolled hypertension 04/12/2023   Anxiety 03/22/2023   Allergy 05/03/2020   Anemia in ESRD (end-stage renal disease) (HCC) 05/03/2020   GERD (gastroesophageal reflux disease) 05/03/2020   Hemorrhoids 05/03/2020   Metabolic acidosis 05/03/2020   PONV (postoperative nausea and vomiting) 05/03/2020   Pneumonia due to COVID-19 virus 09/19/2019   Recurrent UTI 09/19/2019   Hypertension    CKD (chronic  kidney disease), stage IV (HCC)    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 [place] ([place]) [place] [address] Phone: [phone] Fax: [phone]     Social Drivers of Health (SDOH) Social History: SDOH Screenings   Food Insecurity: No Food Insecurity (11/20/2023)  Housing: Low Risk  (11/20/2023)  Transportation Needs: No Transportation Needs (11/20/2023)  Utilities: Not At Risk (11/20/2023)  Tobacco Use: Low Risk  (11/15/2023)   SDOH Interventions:     Readmission Risk Interventions    11/20/2023   11:29 AM  Readmission Risk Prevention Plan  Transportation Screening Complete  PCP or Specialist Appt within 3-5 Days Complete  Social Work Consult for Recovery Care Planning/Counseling Complete  Palliative Care Screening Not Applicable  Medication Review Oceanographer) Complete

## 2023-11-20 NOTE — Progress Notes (Signed)
 Patient arrived from ED to room 254, alert and oriented to person, place, time and situation. Patient reports having pill box, but refuses to allow nurse to take to pharmacy. Patient reports that she is suppose to be getting discharged today. MD notified and floor pharmacist.  Patient reports no pain. Patient reports that she has been unable to find her bracelet.

## 2023-11-20 NOTE — Plan of Care (Signed)
   Problem: Education: Goal: Knowledge of General Education information will improve Description Including pain rating scale, medication(s)/side effects and non-pharmacologic comfort measures Outcome: Progressing

## 2023-11-20 NOTE — Care Management Important Message (Signed)
 Important Message  Patient Details  Name: Jessica Patterson MRN: [id number] Date of Birth: 24-Mar-1960   Important Message Given:  Yes - Medicare IM     Rojelio SHAUNNA Rattler 11/20/2023, 4:42 PM

## 2023-11-20 NOTE — Discharge Summary (Signed)
 [facility] Discharge Summary   Patient: Jessica Patterson FMW:[id number]  PCP: Ricard Tawni KIDD, MD  Date of admission: 11/17/2023   Date of discharge:  11/20/2023     Discharge Diagnoses:  Principal Problem:   Acute hypoxic respiratory failure (HCC)   Admitted From: Home Disposition:  Home   Recommendations for Outpatient Follow-up:  Follow-up with PCP in 1 week Monitor BP at home and follow with PCP to titrate medication accordingly Patient was advised to take losartan and sotalol in a.m. and started Imdur in the evening. Hold off losartan and sotalol on day dialysis days if SBP less than 160 mmHg Hold off Imdur if SBP less than 130 mmHg in the evening Patient was advised to take Imdur sooner than evening if systolic BP greater than 140 mmHg Follow-up with nephrology and continue hemodialysis as per schedule. Repeat B12 level, folic acid level and vitamin D level after 3 to 6 months Follow up LABS/TEST:  as above   Follow-up Information     Ricard Tawni KIDD, MD Follow up in 1 week(s).   Specialty: Family Medicine Contact information: [address] [place] [address] [phone]                Diet recommendation: Renal diet  Activity: The patient is advised to gradually reintroduce usual activities, as tolerated  Discharge Condition: stable  Code Status: Full code   History of present illness: As per the H and P dictated on admission.  Hospital Course:  Jessica Patterson is a 63 y.o. female with medical history significant for ESRD on HD TTS, started hemodialysis in January 2025 followed by nephrology at [facility], uncontrolled hypertension with multiple allergies to antihypertensives, who presents to the ER with complaints of sudden onset shortness of breath today.  Last hemodialysis was on Saturday, completed 3 hours session.  States she has allergies to antihypertensives and has been started on oral antihypertensives 1 at the time to  identify her allergies.  She was on losartan and furosemide prior to admission.   Upon presentation to the ER, the patient was severely hypertensive with SBP in the 220s and DBP's in the 150s.  This was improved with nitro drip.  Initially requiring BiPAP due to flash pulmonary edema seen on chest x-ray.   The patient was additionally started on IV Lasix.  Admitted by [facility], hospitalist service.   ED Course: Temperature 97.8.  BP 160/94, pulse 89, respiration rate 21, O2 saturation 100% on 6 L.     Assessment and Plan:   # Acute hypoxic respiratory failure secondary to flash pulmonary edema Presented with SBP in the 220s, DBP in the 150s, pulmonary edema seen on chest x-ray. Initially requiring BiPAP, weaned off to 6 L high flow nasal cannula. Continue to wean off O2 supplementation as tolerated Not on oxygen supplementation at baseline 10/13 respiratory failure resolved, currently saturating well on room air. Improved after hemodialysis   # Hypertensive emergency SBP 220 on admission, patient was on nitro infusion 10/13 resumed losartan 100 mg p.o. daily 10/14 increased bisoprolol 10 mg p.o. daily 10/14 started Imdur 60 mg p.o. every evening Started pantoprazole 40 mg p.o. daily for GERD/stomach upset Patient blood pressure is stable today, discharged on above medications and patient was advised to monitor BP at home and follow with PCP to titrate medication accordingly.   # Elevated troponin, likely demand ischemia in the setting of acute hypoxic respiratory failure  Flash pulmonary edema. BNP greater than 3400  TTE: LVEF 40 to 45%, global hypokinesis, mild LV hypertrophy, grade 1 DD, small pericardial effusion.  Moderate MR   Possible CAP, POA Could not be excluded- bilateral lower lobe pneumonia from chest x-ray. Received Rocephin and IV azithromycin in the ER Procalcitonin negative, leukocytosis, clinically no signs of infection.  No need of antibiotics.  ESRD on HD TTS S/p IV  Lasix. Last hemodialysis was Saturday, 11/16/2023. 10/13 s/p hemodialysis on 10/13 and 10/14.  Nephrology was consulted.  Cleared by nephro to discharge.   Anemia of chronic disease Hemoglobin at baseline Vitamin B12 was 369, goal >400.  Started B12 supplement.  Follow with PCP to repeat B12 level after 3 to 6 months.  # UTI, urine culture grew Proteus Mirabilis S/p ceftriaxone for 3 days.  No more need of antibiotics.  Body mass index is 22.82 kg/m.  Nutrition Interventions:  Patient was ambulatory without any assistance. On the day of the discharge the patient's vitals were stable, and no other acute medical condition were reported by patient. the patient was felt safe to be discharge at Home.  Consultants: Nephrology Procedures: Hemodialysis  Discharge Exam: General: Appear in no distress, Oral Mucosa Clear, moist. Cardiovascular: S1 and S2 Present, no Murmur, Respiratory: normal respiratory effort, Bilateral Air entry present and no Crackles, no wheezes Abdomen: Bowel Sound present, Soft and no tenderness. Extremities: no Pedal edema, no calf tenderness Neurology: alert and oriented to time, place, and person affect appropriate.  Filed Weights   11/18/23 1018 11/18/23 1413 11/19/23 0806  Weight: 55 kg 52.8 kg 53 kg   Vitals:   11/20/23 1259 11/20/23 1521  BP: (!) 169/91 (!) 142/78  Pulse: 66 66  Resp:  19  Temp:  98.2 F (36.8 C)  SpO2:  96%    DISCHARGE MEDICATION: Allergies as of 11/20/2023       Reactions   Amlodipine Shortness Of Breath, Other (See Comments)   Cephalosporins Shortness Of Breath, Nausea And Vomiting   Has tolerated 2nd (CEFOXITIN) and 3rd (CEFPODOXIME) generation cephalosporins in the past without documented ADRs.    Codeine Shortness Of Breath, Nausea And Vomiting   Hydrochlorothiazide Shortness Of Breath, Other (See Comments)   Hydromorphone Hives, Nausea And Vomiting, Other (See Comments)   Other Reaction(s): GI Intolerance   Latex  Itching, Rash   Other reaction(s): Itching Other reaction(s): Itching    Other reaction(s): Itching Other reaction(s): Itching   Losartan Potassium Shortness Of Breath, Other (See Comments)   Other reaction(s): Shortness Of Breath Other reaction(s): Shortness Of Breath Per pt has been able to take and tolerate   Metoprolol Other (See Comments)   Doxycycline Hives, Rash   Other reaction(s): Hives Other reaction(s): Hives   Lisinopril    Other Reaction(s): cough (is bothersome, but cannot tolerate any other meds - see rest of reactions)   Sucralfate Rash   Cholecalciferol Other (See Comments)   Ciprofloxacin Other (See Comments)   Clonidine Hcl Nausea And Vomiting   Diltiazem    Doxazosin Mesylate Other (See Comments)   Hydralazine     Hydralazine  Hcl Other (See Comments)   Iodine-131 Other (See Comments)   Can not receive due to Kidney disease Can not receive due to Kidney disease   Ivp Dye [iodinated Contrast Media]    Methyldopa Nausea Only   Blisters   Mircera [methoxy Polyethylene Glycol-epoetin  Beta]    Nifedipine    Other Reaction(s): Other (See Comments) CHEST PAIN ACHING   Ranitidine Hcl Other (See Comments)   Sulfa Antibiotics  Cefuroxime Axetil Nausea And Vomiting   Diltiazem Hcl Nausea Only   Other Reaction(s): GI Intolerance   Isosorbide Nausea And Vomiting, Other (See Comments)   Other Reaction(s): GI Intolerance   Morphine Hives, Nausea And Vomiting, Other (See Comments)   Other Reaction(s): GI Intolerance   Penicillin G Rash   welts   Prazosin Other (See Comments)   Headaches        Medication List     STOP taking these medications    Auryxia 1 GM 210 MG(Fe) tablet Generic drug: ferric citrate       TAKE these medications    acetaminophen  325 MG tablet Commonly known as: TYLENOL  Take 650 mg by mouth every 6 (six) hours as needed for moderate pain (pain score 4-6).   bisoprolol 10 MG tablet Commonly known as: ZEBETA Take 1 tablet (10  mg total) by mouth daily. What changed:  medication strength how much to take   busPIRone 5 MG tablet Commonly known as: BUSPAR Take 5 mg by mouth 2 (two) times daily.   cetirizine 10 MG tablet Commonly known as: ZYRTEC Take 10 mg by mouth daily as needed.   cyanocobalamin 1000 MCG tablet Take 1 tablet (1,000 mcg total) by mouth daily. Start taking on: November 21, 2023   cyclobenzaprine  10 MG tablet Commonly known as: FLEXERIL  Take 10 mg by mouth at bedtime as needed for muscle spasms.   diphenhydrAMINE  25 mg capsule Commonly known as: BENADRYL  Take 25 mg by mouth every 30 (thirty) days. Take before monthly Retacrit  injection.   diphenhydramine -acetaminophen  25-500 MG Tabs tablet Commonly known as: TYLENOL  PM Take 1 tablet by mouth at bedtime as needed.   FISH OIL PO Take 1 capsule by mouth as directed. Take 1 capsule 3 times a week.   fluticasone 50 MCG/ACT nasal spray Commonly known as: FLONASE Place 2 sprays into both nostrils daily as needed for allergies or rhinitis.   folic acid 1 MG tablet Commonly known as: FOLVITE Take 1 tablet (1 mg total) by mouth daily. Start taking on: November 21, 2023   furosemide 80 MG tablet Commonly known as: LASIX Take 80 mg by mouth as directed. TAKE 1 TABLET BY MOUTH ONCE DAILY AND EXTRA DOSE AS NEEDED FOR SWELLING OR SHORTNESS OF BREATH   hydrOXYzine 25 MG tablet Commonly known as: ATARAX Take 25 mg by mouth every 6 (six) hours as needed for anxiety.   isosorbide mononitrate 60 MG 24 hr tablet Commonly known as: IMDUR Take 1 tablet (60 mg total) by mouth every evening. Start taking on: November 21, 2023   losartan 100 MG tablet Commonly known as: COZAAR Take 100 mg by mouth daily.   Magnesium Oxide -Mg Supplement 250 MG Tabs Take 1 tablet by mouth daily.   montelukast 10 MG tablet Commonly known as: SINGULAIR Take 10 mg by mouth daily as needed (allergies).   multivitamin Tabs tablet Take 1 tablet by mouth daily.    ondansetron  4 MG disintegrating tablet Commonly known as: ZOFRAN -ODT Take 4 mg by mouth every 8 (eight) hours as needed.   oxyCODONE -acetaminophen  5-325 MG tablet Commonly known as: Percocet Take 1 tablet by mouth every 6 (six) hours as needed for severe pain (pain score 7-10) or moderate pain (pain score 4-6).   pantoprazole 40 MG tablet Commonly known as: PROTONIX Take 1 tablet (40 mg total) by mouth daily for 14 days. Start taking on: November 21, 2023   RETACRIT  IJ Inject as directed. Monthly injection at cancer center  sodium bicarbonate  650 MG tablet Take 650 mg by mouth 2 (two) times a week. Tuesday and Saturday       Allergies  Allergen Reactions   Amlodipine Shortness Of Breath and Other (See Comments)   Cephalosporins Shortness Of Breath and Nausea And Vomiting    Has tolerated 2nd (CEFOXITIN) and 3rd (CEFPODOXIME) generation cephalosporins in the past without documented ADRs.    Codeine Shortness Of Breath and Nausea And Vomiting   Hydrochlorothiazide Shortness Of Breath and Other (See Comments)   Hydromorphone Hives, Nausea And Vomiting and Other (See Comments)    Other Reaction(s): GI Intolerance   Latex Itching and Rash    Other reaction(s): Itching  Other reaction(s): Itching    Other reaction(s): Itching Other reaction(s): Itching   Losartan Potassium Shortness Of Breath and Other (See Comments)    Other reaction(s): Shortness Of Breath Other reaction(s): Shortness Of Breath Per pt has been able to take and tolerate   Metoprolol Other (See Comments)   Doxycycline Hives and Rash    Other reaction(s): Hives Other reaction(s): Hives    Lisinopril     Other Reaction(s): cough (is bothersome, but cannot tolerate any other meds - see rest of reactions)   Sucralfate Rash   Cholecalciferol Other (See Comments)   Ciprofloxacin Other (See Comments)   Clonidine Hcl Nausea And Vomiting   Diltiazem    Doxazosin Mesylate Other (See Comments)   Hydralazine      Hydralazine  Hcl Other (See Comments)   Iodine-131 Other (See Comments)    Can not receive due to Kidney disease Can not receive due to Kidney disease    Ivp Dye [Iodinated Contrast Media]    Methyldopa Nausea Only    Blisters    Mircera [Methoxy Polyethylene Glycol-Epoetin  Beta]    Nifedipine     Other Reaction(s): Other (See Comments)  CHEST PAIN ACHING   Ranitidine Hcl Other (See Comments)   Sulfa Antibiotics    Cefuroxime Axetil Nausea And Vomiting   Diltiazem Hcl Nausea Only    Other Reaction(s): GI Intolerance   Isosorbide Nausea And Vomiting and Other (See Comments)    Other Reaction(s): GI Intolerance   Morphine Hives, Nausea And Vomiting and Other (See Comments)    Other Reaction(s): GI Intolerance   Penicillin G Rash    welts    Prazosin Other (See Comments)    Headaches   Discharge Instructions     Call MD for:  difficulty breathing, headache or visual disturbances   Complete by: As directed    Call MD for:  extreme fatigue   Complete by: As directed    Call MD for:  persistant dizziness or light-headedness   Complete by: As directed    Call MD for:  persistant nausea and vomiting   Complete by: As directed    Call MD for:  severe uncontrolled pain   Complete by: As directed    Call MD for:  temperature >100.4   Complete by: As directed    Diet renal with fluid restriction   Complete by: As directed    Discharge instructions   Complete by: As directed    Follow-up with PCP in 1 week Monitor BP at home and follow with PCP to titrate medication accordingly Patient was advised to take losartan and sotalol in a.m. and started Imdur in the evening. Hold off losartan and sotalol on day dialysis days if SBP less than 160 mmHg Hold off Imdur if SBP less than 130 mmHg in the evening  Patient was advised to take Imdur sooner than evening if systolic BP greater than 140 mmHg Follow-up with nephrology and continue hemodialysis as per schedule. Repeat B12 level,  folic acid level and vitamin D level after 3 to 6 months   Increase activity slowly   Complete by: As directed        The results of significant diagnostics from this hospitalization (including imaging, microbiology, ancillary and laboratory) are listed below for reference.    Significant Diagnostic Studies: ECHOCARDIOGRAM LIMITED Result Date: 11/18/2023    ECHOCARDIOGRAM LIMITED REPORT   Patient Name:   Jessica Patterson Date of Exam: 11/18/2023 Medical Rec #:  [id number]      Height:       60.0 in Accession #:    [id number]     Weight:       127.9 lb Date of Birth:  Jul 20, 1960      BSA:          1.544 m Patient Age:    62 years       BP:           155/86 mmHg Patient Gender: F              HR:           91 bpm. Exam Location:  Inpatient Procedure: Limited Echo, Cardiac Doppler and Color Doppler (Both Spectral and            Color Flow Doppler were utilized during procedure). Indications:     CHF  History:         Patient has prior history of Echocardiogram examinations, most                  recent 09/29/2020. Risk Factors:Hypertension. CKD.  Sonographer:     Philomena Daring Referring Phys:  [id number] TERRY SAILOR HALL Diagnosing Phys: Lonni Hanson MD IMPRESSIONS  1. Left ventricular ejection fraction, by estimation, is 40 to 45%. The left ventricle has mildly decreased function. The left ventricle demonstrates global hypokinesis. There is mild left ventricular hypertrophy. Left ventricular diastolic parameters are consistent with Grade I diastolic dysfunction (impaired relaxation). Elevated left atrial pressure.  2. Right ventricular systolic function is normal. The right ventricular size is normal. There is normal pulmonary artery systolic pressure.  3. A small pericardial effusion is present. The pericardial effusion is circumferential.  4. The mitral valve is normal in structure. Moderate mitral valve regurgitation. No evidence of mitral stenosis.  5. The aortic valve is tricuspid. There is moderate  thickening of the aortic valve. Aortic valve sclerosis/calcification is present, without any evidence of aortic stenosis.  6. The inferior vena cava is dilated in size with <50% respiratory variability, suggesting right atrial pressure of 15 mmHg. FINDINGS  Left Ventricle: Left ventricular ejection fraction, by estimation, is 40 to 45%. The left ventricle has mildly decreased function. The left ventricle demonstrates global hypokinesis. The left ventricular internal cavity size was normal in size. There is  mild left ventricular hypertrophy. Left ventricular diastolic parameters are consistent with Grade I diastolic dysfunction (impaired relaxation). Elevated left atrial pressure. Right Ventricle: The right ventricular size is normal. No increase in right ventricular wall thickness. Right ventricular systolic function is normal. There is normal pulmonary artery systolic pressure. The tricuspid regurgitant velocity is 2.14 m/s, and  with an assumed right atrial pressure of 15 mmHg, the estimated right ventricular systolic pressure is 33.3 mmHg. Pericardium: A small pericardial effusion is present. The pericardial effusion is circumferential. Mitral  Valve: The mitral valve is normal in structure. Moderate mitral valve regurgitation. No evidence of mitral valve stenosis. Tricuspid Valve: The tricuspid valve is normal in structure. Tricuspid valve regurgitation is trivial. Aortic Valve: The aortic valve is tricuspid. There is moderate thickening of the aortic valve. Aortic valve sclerosis/calcification is present, without any evidence of aortic stenosis. Aortic valve mean gradient measures 8.9 mmHg. Aortic valve peak gradient measures 14.8 mmHg. Aortic valve area, by VTI measures 2.15 cm. Pulmonic Valve: The pulmonic valve was normal in structure. Pulmonic valve regurgitation is trivial. No evidence of pulmonic stenosis. Aorta: The aortic root and ascending aorta are structurally normal, with no evidence of dilitation.  Pulmonary Artery: The pulmonary artery is of normal size. Venous: The inferior vena cava is dilated in size with less than 50% respiratory variability, suggesting right atrial pressure of 15 mmHg. IAS/Shunts: The interatrial septum was not assessed. Additional Comments: Spectral Doppler performed. Color Doppler performed.  LEFT VENTRICLE PLAX 2D LVIDd:         5.20 cm   Diastology LVIDs:         4.00 cm   LV e' medial:    4.79 cm/s LV PW:         1.30 cm   LV E/e' medial:  26.5 LV IVS:        1.00 cm   LV e' lateral:   7.29 cm/s LVOT diam:     2.00 cm   LV E/e' lateral: 17.4 LV SV:         74 LV SV Index:   48 LVOT Area:     3.14 cm  RIGHT VENTRICLE             IVC RV S prime:     15.30 cm/s  IVC diam: 2.10 cm TAPSE (M-mode): 2.5 cm LEFT ATRIUM         Index LA diam:    4.30 cm 2.79 cm/m  AORTIC VALVE AV Area (Vmax):    2.19 cm AV Area (Vmean):   1.94 cm AV Area (VTI):     2.15 cm AV Vmax:           192.50 cm/s AV Vmean:          141.202 cm/s AV VTI:            0.347 m AV Peak Grad:      14.8 mmHg AV Mean Grad:      8.9 mmHg LVOT Vmax:         134.00 cm/s LVOT Vmean:        87.400 cm/s LVOT VTI:          0.237 m LVOT/AV VTI ratio: 0.68  AORTA Ao Root diam: 2.90 cm MITRAL VALVE                TRICUSPID VALVE MV Area (PHT): 4.80 cm     TR Peak grad:   18.3 mmHg MV Decel Time: 158 msec     TR Vmax:        214.00 cm/s MV E velocity: 127.00 cm/s MV A velocity: 140.00 cm/s  SHUNTS MV E/A ratio:  0.91         Systemic VTI:  0.24 m                             Systemic Diam: 2.00 cm Lonni Hanson MD Electronically signed by Lonni Hanson MD Signature Date/Time: 11/18/2023/6:09:01 PM  Final    DG Chest Port 1 View Result Date: 11/17/2023 CLINICAL DATA:  Respiratory distress EXAM: PORTABLE CHEST 1 VIEW COMPARISON:  11/08/2023 FINDINGS: Heart is borderline in size. Mediastinal contours within normal limits. Extensive bilateral perihilar and lower lobe airspace opacities, similar prior study. No effusions. No  acute bony abnormality. IMPRESSION: Bilateral extensive airspace opacities in the perihilar and lower lobe regions could reflect edema or infection. Findings similar to prior study. Electronically Signed   By: Franky Crease M.D.   On: 11/17/2023 20:48   DG Chest Port 1 View Result Date: 11/08/2023 CLINICAL DATA:  Dyspnea EXAM: PORTABLE CHEST 1 VIEW COMPARISON:  10/14/2023 FINDINGS: Cardiomegaly. Extensive bilateral pulmonary edema or infiltrates compared to prior. No sizable effusion. Aortic atherosclerosis. No pneumothorax IMPRESSION: Cardiomegaly with extensive bilateral pulmonary edema or infiltrates. Electronically Signed   By: Luke Bun M.D.   On: 11/08/2023 23:53    Microbiology: Recent Results (from the past 240 hours)  Resp panel by RT-PCR (RSV, Flu A&B, Covid) Anterior Nasal Swab     Status: None   Collection Time: 11/17/23  8:42 PM   Specimen: Anterior Nasal Swab  Result Value Ref Range Status   SARS Coronavirus 2 by RT PCR NEGATIVE NEGATIVE Final    Comment: (NOTE) SARS-CoV-2 target nucleic acids are NOT DETECTED.  The SARS-CoV-2 RNA is generally detectable in upper respiratory specimens during the acute phase of infection. The lowest concentration of SARS-CoV-2 viral copies this assay can detect is 138 copies/mL. A negative result does not preclude SARS-Cov-2 infection and should not be used as the sole basis for treatment or other patient management decisions. A negative result may occur with  improper specimen collection/handling, submission of specimen other than nasopharyngeal swab, presence of viral mutation(s) within the areas targeted by this assay, and inadequate number of viral copies(<138 copies/mL). A negative result must be combined with clinical observations, patient history, and epidemiological information. The expected result is Negative.  Fact Sheet for Patients:  BloggerCourse.com  Fact Sheet for Healthcare Providers:   SeriousBroker.it  This test is no t yet approved or cleared by the [place]  FDA and  has been authorized for detection and/or diagnosis of SARS-CoV-2 by FDA under an Emergency Use Authorization (EUA). This EUA will remain  in effect (meaning this test can be used) for the duration of the COVID-19 declaration under Section 564(b)(1) of the Act, 21 U.S.C.section 360bbb-3(b)(1), unless the authorization is terminated  or revoked sooner.       Influenza A by PCR NEGATIVE NEGATIVE Final   Influenza B by PCR NEGATIVE NEGATIVE Final    Comment: (NOTE) The Xpert Xpress SARS-CoV-2/FLU/RSV plus assay is intended as an aid in the diagnosis of influenza from Nasopharyngeal swab specimens and should not be used as a sole basis for treatment. Nasal washings and aspirates are unacceptable for Xpert Xpress SARS-CoV-2/FLU/RSV testing.  Fact Sheet for Patients: BloggerCourse.com  Fact Sheet for Healthcare Providers: SeriousBroker.it  This test is not yet approved or cleared by the [place]  FDA and has been authorized for detection and/or diagnosis of SARS-CoV-2 by FDA under an Emergency Use Authorization (EUA). This EUA will remain in effect (meaning this test can be used) for the duration of the COVID-19 declaration under Section 564(b)(1) of the Act, 21 U.S.C. section 360bbb-3(b)(1), unless the authorization is terminated or revoked.     Resp Syncytial Virus by PCR NEGATIVE NEGATIVE Final    Comment: (NOTE) Fact Sheet for Patients: BloggerCourse.com  Fact Sheet for Healthcare Providers:  SeriousBroker.it  This test is not yet approved or cleared by the [place]  FDA and has been authorized for detection and/or diagnosis of SARS-CoV-2 by FDA under an Emergency Use Authorization (EUA). This EUA will remain in effect (meaning this test can be used) for  the duration of the COVID-19 declaration under Section 564(b)(1) of the Act, 21 U.S.C. section 360bbb-3(b)(1), unless the authorization is terminated or revoked.  Performed at [facility], [address] [place], [place] [address]   Blood Culture (routine x 2)     Status: None (Preliminary result)   Collection Time: 11/17/23  8:42 PM   Specimen: BLOOD  Result Value Ref Range Status   Specimen Description BLOOD BLOOD RIGHT ARM  Final   Special Requests   Final    BOTTLES DRAWN AEROBIC AND ANAEROBIC Blood Culture results may not be optimal due to an inadequate volume of blood received in culture bottles   Culture   Final    NO GROWTH 3 DAYS Performed at [facility], [address] [place], [place] [address]    Report Status PENDING  Incomplete  Blood Culture (routine x 2)     Status: None (Preliminary result)   Collection Time: 11/17/23  8:42 PM   Specimen: BLOOD  Result Value Ref Range Status   Specimen Description BLOOD BLOOD RIGHT HAND  Final   Special Requests   Final    BOTTLES DRAWN AEROBIC AND ANAEROBIC Blood Culture results may not be optimal due to an inadequate volume of blood received in culture bottles   Culture   Final    NO GROWTH 3 DAYS Performed at [facility], [address], [place], [place] [address]    Report Status PENDING  Incomplete  Urine Culture     Status: Abnormal   Collection Time: 11/18/23  6:21 AM   Specimen: Urine, Random  Result Value Ref Range Status   Specimen Description   Final    URINE, RANDOM Performed at [facility], [address] [place], [place] [address]    Special Requests   Final    NONE Reflexed from [id number] Performed at [facility], [address] [place], [place] [address]    Culture >=100,000 COLONIES/mL PROTEUS MIRABILIS (A)  Final   Report Status 11/20/2023 FINAL  Final   Organism ID, Bacteria PROTEUS MIRABILIS (A)  Final      Susceptibility   Proteus mirabilis -  MIC*    AMPICILLIN <=2 SENSITIVE Sensitive     CEFAZOLIN (URINE) Value in next row Sensitive      4 SENSITIVEThis is a modified FDA-approved test that has been validated and its performance characteristics determined by the reporting laboratory.  This laboratory is certified under the Clinical Laboratory Improvement Amendments CLIA as qualified to perform high complexity clinical laboratory testing.    CEFEPIME Value in next row Sensitive      4 SENSITIVEThis is a modified FDA-approved test that has been validated and its performance characteristics determined by the reporting laboratory.  This laboratory is certified under the Clinical Laboratory Improvement Amendments CLIA as qualified to perform high complexity clinical laboratory testing.    ERTAPENEM Value in next row Sensitive      4 SENSITIVEThis is a modified FDA-approved test that has been validated and its performance characteristics determined by the reporting laboratory.  This laboratory is certified under the Clinical Laboratory Improvement Amendments CLIA as qualified to perform high complexity clinical laboratory testing.    CEFTRIAXONE Value  in next row Sensitive      4 SENSITIVEThis is a modified FDA-approved test that has been validated and its performance characteristics determined by the reporting laboratory.  This laboratory is certified under the Clinical Laboratory Improvement Amendments CLIA as qualified to perform high complexity clinical laboratory testing.    CIPROFLOXACIN Value in next row Sensitive      4 SENSITIVEThis is a modified FDA-approved test that has been validated and its performance characteristics determined by the reporting laboratory.  This laboratory is certified under the Clinical Laboratory Improvement Amendments CLIA as qualified to perform high complexity clinical laboratory testing.    GENTAMICIN Value in next row Sensitive      4 SENSITIVEThis is a modified FDA-approved test that has been validated and  its performance characteristics determined by the reporting laboratory.  This laboratory is certified under the Clinical Laboratory Improvement Amendments CLIA as qualified to perform high complexity clinical laboratory testing.    NITROFURANTOIN Value in next row Resistant      4 SENSITIVEThis is a modified FDA-approved test that has been validated and its performance characteristics determined by the reporting laboratory.  This laboratory is certified under the Clinical Laboratory Improvement Amendments CLIA as qualified to perform high complexity clinical laboratory testing.    TRIMETH/SULFA Value in next row Sensitive      4 SENSITIVEThis is a modified FDA-approved test that has been validated and its performance characteristics determined by the reporting laboratory.  This laboratory is certified under the Clinical Laboratory Improvement Amendments CLIA as qualified to perform high complexity clinical laboratory testing.    AMPICILLIN/SULBACTAM Value in next row Sensitive      4 SENSITIVEThis is a modified FDA-approved test that has been validated and its performance characteristics determined by the reporting laboratory.  This laboratory is certified under the Clinical Laboratory Improvement Amendments CLIA as qualified to perform high complexity clinical laboratory testing.    PIP/TAZO Value in next row Sensitive      <=4 SENSITIVEThis is a modified FDA-approved test that has been validated and its performance characteristics determined by the reporting laboratory.  This laboratory is certified under the Clinical Laboratory Improvement Amendments CLIA as qualified to perform high complexity clinical laboratory testing.    MEROPENEM Value in next row Sensitive      <=4 SENSITIVEThis is a modified FDA-approved test that has been validated and its performance characteristics determined by the reporting laboratory.  This laboratory is certified under the Clinical Laboratory Improvement Amendments CLIA  as qualified to perform high complexity clinical laboratory testing.    * >=100,000 COLONIES/mL PROTEUS MIRABILIS     Labs: CBC: Recent Labs  Lab 11/15/23 1024 11/17/23 2024 11/18/23 0513 11/19/23 0516 11/20/23 0508  WBC 6.8 13.3* 9.7 5.1 4.7  NEUTROABS 3.7 8.7*  --   --   --   HGB 9.0* 9.9* 7.8* 8.6* 8.7*  HCT 28.2* 31.5* 25.1* 26.7* 27.3*  MCV 96.6 98.7 98.0 96.0 96.5  PLT 185 254 186 217 227   Basic Metabolic Panel: Recent Labs  Lab 11/17/23 2024 11/18/23 0513 11/19/23 0516 11/20/23 0508  NA 138 140 136 135  K 4.4 5.6* 4.6 3.8  CL 102 103 98 97*  CO2 21* 19* 25 26  GLUCOSE 145* 97 98 97  BUN 48* 52* 32* 30*  CREATININE 8.07* 8.58* 5.81* 5.60*  CALCIUM 8.6* 8.1* 8.7* 8.7*  MG  --   --  2.4 2.2  PHOS  --  5.9* 5.5* 5.3*   Liver Function  Tests: Recent Labs  Lab 11/17/23 2024 11/18/23 0513  AST 22  --   ALT 10  --   ALKPHOS 57  --   BILITOT 0.7  --   PROT 7.3  --   ALBUMIN 4.0 3.1*   No results for input(s): LIPASE, AMYLASE in the last 168 hours. No results for input(s): AMMONIA in the last 168 hours. Cardiac Enzymes: No results for input(s): CKTOTAL, CKMB, CKMBINDEX, TROPONINI in the last 168 hours. BNP (last 3 results) Recent Labs    11/08/23 2317 11/17/23 2024  BNP 3,044.7* 3,461.3*   CBG: No results for input(s): GLUCAP in the last 168 hours.  Time spent: 35 minutes  Signed:  Elvan Sor  [facility] 11/20/2023 3:31 PM

## 2023-11-20 NOTE — Progress Notes (Signed)
 [facility] [place], [place] 11/20/23  Subjective:   Hospital day # 2  Patient known to our practice from outpatient dialysis last dialysis treatment was on October 11.  He presented by EMS for severe shortness of breath after grocery shopping.  Denies missing any recent dialysis treatments.   Update Patient seen ambulating in room Denies headache or nausea Denies pain or discomfort  Blood pressure 158/88 today.  Objective:  Vital signs in last 24 hours:  Temp:  [98 F (36.7 C)-99.2 F (37.3 C)] 98 F (36.7 C) (10/15 1120) Pulse Rate:  [60-70] 66 (10/15 1259) Resp:  [13-23] 19 (10/15 1120) BP: (153-174)/(73-102) 169/91 (10/15 1259) SpO2:  [94 %-99 %] 96 % (10/15 1120)  Weight change: -2 kg Filed Weights   11/18/23 1018 11/18/23 1413 11/19/23 0806  Weight: 55 kg 52.8 kg 53 kg    Intake/Output:    Intake/Output Summary (Last 24 hours) at 11/20/2023 1301 Last data filed at 11/20/2023 0900 Gross per 24 hour  Intake 590 ml  Output --  Net 590 ml     Physical Exam: General: No acute distress, laying in the bed  HEENT Moist oral mucous membranes  Pulm/lungs [place] O2, normal breathing effort, coarse breath sounds  CVS/Heart Regular rhythm  Abdomen:  Nontender nondistended  Extremities: No significant edema  Neurologic: Alert, oriented  Skin: No acute rashes  Access: Left upper extremity AV graft       Basic Metabolic Panel:  Recent Labs  Lab 11/17/23 2024 11/18/23 0513 11/19/23 0516 11/20/23 0508  NA 138 140 136 135  K 4.4 5.6* 4.6 3.8  CL 102 103 98 97*  CO2 21* 19* 25 26  GLUCOSE 145* 97 98 97  BUN 48* 52* 32* 30*  CREATININE 8.07* 8.58* 5.81* 5.60*  CALCIUM 8.6* 8.1* 8.7* 8.7*  MG  --   --  2.4 2.2  PHOS  --  5.9* 5.5* 5.3*     CBC: Recent Labs  Lab 11/15/23 1024 11/17/23 2024 11/18/23 0513 11/19/23 0516 11/20/23 0508  WBC 6.8 13.3* 9.7 5.1 4.7  NEUTROABS 3.7 8.7*  --   --   --   HGB 9.0* 9.9* 7.8* 8.6* 8.7*  HCT  28.2* 31.5* 25.1* 26.7* 27.3*  MCV 96.6 98.7 98.0 96.0 96.5  PLT 185 254 186 217 227      Lab Results  Component Value Date   HEPBSAG NON REACTIVE 11/09/2023      Microbiology:  Recent Results (from the past 240 hours)  Resp panel by RT-PCR (RSV, Flu A&B, Covid) Anterior Nasal Swab     Status: None   Collection Time: 11/17/23  8:42 PM   Specimen: Anterior Nasal Swab  Result Value Ref Range Status   SARS Coronavirus 2 by RT PCR NEGATIVE NEGATIVE Final    Comment: (NOTE) SARS-CoV-2 target nucleic acids are NOT DETECTED.  The SARS-CoV-2 RNA is generally detectable in upper respiratory specimens during the acute phase of infection. The lowest concentration of SARS-CoV-2 viral copies this assay can detect is 138 copies/mL. A negative result does not preclude SARS-Cov-2 infection and should not be used as the sole basis for treatment or other patient management decisions. A negative result may occur with  improper specimen collection/handling, submission of specimen other than nasopharyngeal swab, presence of viral mutation(s) within the areas targeted by this assay, and inadequate number of viral copies(<138 copies/mL). A negative result must be combined with clinical observations, patient history, and epidemiological information. The expected result is Negative.  Fact Sheet for Patients:  BloggerCourse.com  Fact Sheet for Healthcare Providers:  SeriousBroker.it  This test is no t yet approved or cleared by the [place]  FDA and  has been authorized for detection and/or diagnosis of SARS-CoV-2 by FDA under an Emergency Use Authorization (EUA). This EUA will remain  in effect (meaning this test can be used) for the duration of the COVID-19 declaration under Section 564(b)(1) of the Act, 21 U.S.C.section 360bbb-3(b)(1), unless the authorization is terminated  or revoked sooner.       Influenza A by PCR NEGATIVE  NEGATIVE Final   Influenza B by PCR NEGATIVE NEGATIVE Final    Comment: (NOTE) The Xpert Xpress SARS-CoV-2/FLU/RSV plus assay is intended as an aid in the diagnosis of influenza from Nasopharyngeal swab specimens and should not be used as a sole basis for treatment. Nasal washings and aspirates are unacceptable for Xpert Xpress SARS-CoV-2/FLU/RSV testing.  Fact Sheet for Patients: BloggerCourse.com  Fact Sheet for Healthcare Providers: SeriousBroker.it  This test is not yet approved or cleared by the [place]  FDA and has been authorized for detection and/or diagnosis of SARS-CoV-2 by FDA under an Emergency Use Authorization (EUA). This EUA will remain in effect (meaning this test can be used) for the duration of the COVID-19 declaration under Section 564(b)(1) of the Act, 21 U.S.C. section 360bbb-3(b)(1), unless the authorization is terminated or revoked.     Resp Syncytial Virus by PCR NEGATIVE NEGATIVE Final    Comment: (NOTE) Fact Sheet for Patients: BloggerCourse.com  Fact Sheet for Healthcare Providers: SeriousBroker.it  This test is not yet approved or cleared by the [place]  FDA and has been authorized for detection and/or diagnosis of SARS-CoV-2 by FDA under an Emergency Use Authorization (EUA). This EUA will remain in effect (meaning this test can be used) for the duration of the COVID-19 declaration under Section 564(b)(1) of the Act, 21 U.S.C. section 360bbb-3(b)(1), unless the authorization is terminated or revoked.  Performed at [facility], [address] [place], [place] [address]   Blood Culture (routine x 2)     Status: None (Preliminary result)   Collection Time: 11/17/23  8:42 PM   Specimen: BLOOD  Result Value Ref Range Status   Specimen Description BLOOD BLOOD RIGHT ARM  Final   Special Requests   Final    BOTTLES DRAWN AEROBIC  AND ANAEROBIC Blood Culture results may not be optimal due to an inadequate volume of blood received in culture bottles   Culture   Final    NO GROWTH 3 DAYS Performed at [facility], [address], [place], [place] [address]    Report Status PENDING  Incomplete  Blood Culture (routine x 2)     Status: None (Preliminary result)   Collection Time: 11/17/23  8:42 PM   Specimen: BLOOD  Result Value Ref Range Status   Specimen Description BLOOD BLOOD RIGHT HAND  Final   Special Requests   Final    BOTTLES DRAWN AEROBIC AND ANAEROBIC Blood Culture results may not be optimal due to an inadequate volume of blood received in culture bottles   Culture   Final    NO GROWTH 3 DAYS Performed at [facility], [address], [place], [place] [address]    Report Status PENDING  Incomplete  Urine Culture     Status: Abnormal   Collection Time: 11/18/23  6:21 AM   Specimen: Urine, Random  Result Value Ref Range Status   Specimen Description   Final  URINE, RANDOM Performed at [facility], [address] [place], [place] [address]    Special Requests   Final    NONE Reflexed from [phone] Performed at [facility], [address] [place], [place] [address]    Culture >=100,000 COLONIES/mL PROTEUS MIRABILIS (A)  Final   Report Status 11/20/2023 FINAL  Final   Organism ID, Bacteria PROTEUS MIRABILIS (A)  Final      Susceptibility   Proteus mirabilis - MIC*    AMPICILLIN <=2 SENSITIVE Sensitive     CEFAZOLIN (URINE) Value in next row Sensitive      4 SENSITIVEThis is a modified FDA-approved test that has been validated and its performance characteristics determined by the reporting laboratory.  This laboratory is certified under the Clinical Laboratory Improvement Amendments CLIA as qualified to perform high complexity clinical laboratory testing.    CEFEPIME Value in next row Sensitive      4 SENSITIVEThis is a modified FDA-approved test that has been  validated and its performance characteristics determined by the reporting laboratory.  This laboratory is certified under the Clinical Laboratory Improvement Amendments CLIA as qualified to perform high complexity clinical laboratory testing.    ERTAPENEM Value in next row Sensitive      4 SENSITIVEThis is a modified FDA-approved test that has been validated and its performance characteristics determined by the reporting laboratory.  This laboratory is certified under the Clinical Laboratory Improvement Amendments CLIA as qualified to perform high complexity clinical laboratory testing.    CEFTRIAXONE Value in next row Sensitive      4 SENSITIVEThis is a modified FDA-approved test that has been validated and its performance characteristics determined by the reporting laboratory.  This laboratory is certified under the Clinical Laboratory Improvement Amendments CLIA as qualified to perform high complexity clinical laboratory testing.    CIPROFLOXACIN Value in next row Sensitive      4 SENSITIVEThis is a modified FDA-approved test that has been validated and its performance characteristics determined by the reporting laboratory.  This laboratory is certified under the Clinical Laboratory Improvement Amendments CLIA as qualified to perform high complexity clinical laboratory testing.    GENTAMICIN Value in next row Sensitive      4 SENSITIVEThis is a modified FDA-approved test that has been validated and its performance characteristics determined by the reporting laboratory.  This laboratory is certified under the Clinical Laboratory Improvement Amendments CLIA as qualified to perform high complexity clinical laboratory testing.    NITROFURANTOIN Value in next row Resistant      4 SENSITIVEThis is a modified FDA-approved test that has been validated and its performance characteristics determined by the reporting laboratory.  This laboratory is certified under the Clinical Laboratory Improvement Amendments  CLIA as qualified to perform high complexity clinical laboratory testing.    TRIMETH/SULFA Value in next row Sensitive      4 SENSITIVEThis is a modified FDA-approved test that has been validated and its performance characteristics determined by the reporting laboratory.  This laboratory is certified under the Clinical Laboratory Improvement Amendments CLIA as qualified to perform high complexity clinical laboratory testing.    AMPICILLIN/SULBACTAM Value in next row Sensitive      4 SENSITIVEThis is a modified FDA-approved test that has been validated and its performance characteristics determined by the reporting laboratory.  This laboratory is certified under the Clinical Laboratory Improvement Amendments CLIA as qualified to perform high complexity clinical laboratory testing.    PIP/TAZO Value in next row Sensitive      <=  4 SENSITIVEThis is a modified FDA-approved test that has been validated and its performance characteristics determined by the reporting laboratory.  This laboratory is certified under the Clinical Laboratory Improvement Amendments CLIA as qualified to perform high complexity clinical laboratory testing.    MEROPENEM Value in next row Sensitive      <=4 SENSITIVEThis is a modified FDA-approved test that has been validated and its performance characteristics determined by the reporting laboratory.  This laboratory is certified under the Clinical Laboratory Improvement Amendments CLIA as qualified to perform high complexity clinical laboratory testing.    * >=100,000 COLONIES/mL PROTEUS MIRABILIS    Coagulation Studies: Recent Labs    11/17/23 2022/12/06  LABPROT 13.6  INR 1.0    Urinalysis: Recent Labs    11/18/23 0621  COLORURINE AMBER*  LABSPEC 1.011  PHURINE 9.0*  GLUCOSEU 50*  HGBUR NEGATIVE  BILIRUBINUR NEGATIVE  KETONESUR 5*  PROTEINUR 100*  NITRITE NEGATIVE  LEUKOCYTESUR SMALL*      Imaging: No results found.    Medications:    azithromycin Stopped  (11/19/23 2336)   cefTRIAXone (ROCEPHIN)  IV Stopped (11/19/23 2140)   nitroGLYCERIN Stopped (11/17/23 12-06-34)    bisoprolol  10 mg Oral Daily   busPIRone  5 mg Oral BID   Chlorhexidine  Gluconate Cloth  6 each Topical Q0600   vitamin B-12  1,000 mcg Oral Daily   folic acid  1 mg Oral Daily   heparin   5,000 Units Subcutaneous Q8H   isosorbide mononitrate  60 mg Oral QPM   losartan  100 mg Oral Daily   pantoprazole  40 mg Oral Daily   sodium bicarbonate   650 mg Oral Once per day on Tuesday Saturday   acetaminophen , cyclobenzaprine , fluticasone, hydrOXYzine, melatonin, polyethylene glycol, prochlorperazine  Assessment/ Plan:  63 y.o. female with end-stage renal disease, severe hypertension, heart failure with reduced ejection fraction  admitted on 11/17/2023 for Flash pulmonary edema (HCC) [J81.0] Acute respiratory failure with hypoxia (HCC) [J96.01] ESRD on hemodialysis (HCC) [N18.6, Z99.2] Acute hypoxic respiratory failure (HCC) [J96.01]   [facility]/53.5 kg/TTS/left upper extremity AV graft  End-stage renal disease, with shortness of breath Likely caused by severe hypertension which is uncontrolled. Patient was recently evaluated by cardiologist as outpatient on 11/06/2023.  Amlodipine was discontinued and patient was started on losartan 50 mg once a day.  She was also to be started on bisoprolol once daily in 2 weeks.  ECHO from 10/13 shows EF 40-45% with a grade 1 DD.   Received dialysis yesterday, UF 1.5 L achieved.  Next treatment scheduled for Thursday.  Hypertension with chronic kidney disease Outpatient regimen includes bisoprolol and losartan. She had not started taking bisoprolol even though was instructed to start taking it at previous discharge. Has received Furosemide 160mg  and enalaprilat during this admission.   Patient currently receiving losartan, bisoprolol, and isosorbide started by primary team.  Patient instructed to take bisoprolol and isosorbide in  the evenings.  Will continue to monitor outpatient.  Anemia in chronic kidney disease Denies signs of bleeding. Hgb 8.7, stable  Secondary Hyperparathyroidism: with outpatient labs: PTH 549, phosphorus 7.5, calcium 7.8 on 10/15/23.   Lab Results  Component Value Date   CALCIUM 8.7 (L) 11/20/2023   CAION 1.09 (L) 05/31/2023   PHOS 5.3 (H) 11/20/2023    Will continue to monitor bone minerals.  Anxiety Patient mentioned that she was not taking buspirone on a regular basis.  Currently taking twice daily during this admission.     LOS: 2  Jessica Patterson 10/15/20251:01 PM  [address] [place], [place] [phone]

## 2023-11-22 LAB — CULTURE, BLOOD (ROUTINE X 2)
Culture: NO GROWTH
Culture: NO GROWTH

## 2023-11-29 ENCOUNTER — Inpatient Hospital Stay

## 2023-11-29 DIAGNOSIS — I120 Hypertensive chronic kidney disease with stage 5 chronic kidney disease or end stage renal disease: Secondary | ICD-10-CM | POA: Diagnosis not present

## 2023-11-29 DIAGNOSIS — D631 Anemia in chronic kidney disease: Secondary | ICD-10-CM

## 2023-11-29 LAB — HEMOGLOBIN AND HEMATOCRIT (CANCER CENTER ONLY)
HCT: 29.8 % — ABNORMAL LOW (ref 36.0–46.0)
Hemoglobin: 9.5 g/dL — ABNORMAL LOW (ref 12.0–15.0)

## 2023-11-29 NOTE — Progress Notes (Signed)
 Blood pressure checked multiple times today with consistent high readings.  Discussed with Dr. Babara who recommends she return next week for EPO injection.  Informed patient of MD advice but she does not want to return next week and will keep appt in 2 weeks as scheduled.

## 2023-12-06 ENCOUNTER — Ambulatory Visit
Admission: RE | Admit: 2023-12-06 | Discharge: 2023-12-06 | Disposition: A | Discharge status: Home / Self Care | Source: Ambulatory Visit | Attending: Family Medicine | Admitting: Family Medicine

## 2023-12-06 DIAGNOSIS — Z1231 Encounter for screening mammogram for malignant neoplasm of breast: Secondary | ICD-10-CM | POA: Insufficient documentation

## 2023-12-13 ENCOUNTER — Inpatient Hospital Stay

## 2023-12-13 ENCOUNTER — Inpatient Hospital Stay: Attending: Oncology

## 2023-12-13 VITALS — BP 176/86 | HR 61 | Temp 98.4°F | Resp 18

## 2023-12-13 DIAGNOSIS — I120 Hypertensive chronic kidney disease with stage 5 chronic kidney disease or end stage renal disease: Secondary | ICD-10-CM | POA: Insufficient documentation

## 2023-12-13 DIAGNOSIS — N186 End stage renal disease: Secondary | ICD-10-CM | POA: Insufficient documentation

## 2023-12-13 DIAGNOSIS — Z992 Dependence on renal dialysis: Secondary | ICD-10-CM | POA: Diagnosis not present

## 2023-12-13 DIAGNOSIS — D631 Anemia in chronic kidney disease: Secondary | ICD-10-CM | POA: Insufficient documentation

## 2023-12-13 LAB — HEMOGLOBIN AND HEMATOCRIT (CANCER CENTER ONLY)
HCT: 28 % — ABNORMAL LOW (ref 36.0–46.0)
Hemoglobin: 8.9 g/dL — ABNORMAL LOW (ref 12.0–15.0)

## 2023-12-13 MED ORDER — EPOETIN ALFA-EPBX 40000 UNIT/ML IJ SOLN
40000.0000 [IU] | Freq: Once | INTRAMUSCULAR | Status: AC
  Administered 2023-12-13: 40000 [IU] via SUBCUTANEOUS
  Filled 2023-12-13: qty 1

## 2023-12-13 NOTE — Patient Instructions (Signed)

## 2023-12-27 ENCOUNTER — Inpatient Hospital Stay

## 2023-12-27 DIAGNOSIS — I120 Hypertensive chronic kidney disease with stage 5 chronic kidney disease or end stage renal disease: Secondary | ICD-10-CM | POA: Diagnosis not present

## 2023-12-27 DIAGNOSIS — D631 Anemia in chronic kidney disease: Secondary | ICD-10-CM

## 2023-12-27 LAB — HEMOGLOBIN AND HEMATOCRIT (CANCER CENTER ONLY)
HCT: 31.9 % — ABNORMAL LOW (ref 36.0–46.0)
Hemoglobin: 10.3 g/dL — ABNORMAL LOW (ref 12.0–15.0)

## 2023-12-27 NOTE — Progress Notes (Signed)
 Hgb at 10.3 today; parameters hold if hgb at 10 or above

## 2024-01-10 ENCOUNTER — Inpatient Hospital Stay: Attending: Oncology

## 2024-01-10 ENCOUNTER — Inpatient Hospital Stay

## 2024-01-10 DIAGNOSIS — Z992 Dependence on renal dialysis: Secondary | ICD-10-CM | POA: Insufficient documentation

## 2024-01-10 DIAGNOSIS — D631 Anemia in chronic kidney disease: Secondary | ICD-10-CM | POA: Insufficient documentation

## 2024-01-10 DIAGNOSIS — I120 Hypertensive chronic kidney disease with stage 5 chronic kidney disease or end stage renal disease: Secondary | ICD-10-CM | POA: Insufficient documentation

## 2024-01-10 DIAGNOSIS — N186 End stage renal disease: Secondary | ICD-10-CM | POA: Diagnosis present

## 2024-01-10 LAB — HEMOGLOBIN AND HEMATOCRIT (CANCER CENTER ONLY)
HCT: 29.3 % — ABNORMAL LOW (ref 36.0–46.0)
Hemoglobin: 9.3 g/dL — ABNORMAL LOW (ref 12.0–15.0)

## 2024-01-10 NOTE — Progress Notes (Signed)
 Per provider hold reatcrit due to elevated BP outside parameters. Patient r/s to next week. Patient verbalized understanding.

## 2024-01-17 ENCOUNTER — Inpatient Hospital Stay

## 2024-01-17 VITALS — BP 180/82

## 2024-01-17 DIAGNOSIS — D631 Anemia in chronic kidney disease: Secondary | ICD-10-CM

## 2024-01-17 DIAGNOSIS — I120 Hypertensive chronic kidney disease with stage 5 chronic kidney disease or end stage renal disease: Secondary | ICD-10-CM | POA: Diagnosis not present

## 2024-01-17 LAB — HEMOGLOBIN AND HEMATOCRIT (CANCER CENTER ONLY)
HCT: 24.9 % — ABNORMAL LOW (ref 36.0–46.0)
Hemoglobin: 7.9 g/dL — ABNORMAL LOW (ref 12.0–15.0)

## 2024-01-17 MED ORDER — EPOETIN ALFA-EPBX 40000 UNIT/ML IJ SOLN
40000.0000 [IU] | Freq: Once | INTRAMUSCULAR | Status: AC
  Administered 2024-01-17: 40000 [IU] via SUBCUTANEOUS
  Filled 2024-01-17: qty 1

## 2024-01-24 ENCOUNTER — Other Ambulatory Visit

## 2024-01-24 ENCOUNTER — Ambulatory Visit

## 2024-02-03 ENCOUNTER — Inpatient Hospital Stay

## 2024-02-03 VITALS — BP 180/84

## 2024-02-03 DIAGNOSIS — D631 Anemia in chronic kidney disease: Secondary | ICD-10-CM

## 2024-02-03 DIAGNOSIS — I120 Hypertensive chronic kidney disease with stage 5 chronic kidney disease or end stage renal disease: Secondary | ICD-10-CM | POA: Diagnosis not present

## 2024-02-03 LAB — RETIC PANEL
Immature Retic Fract: 3.5 % (ref 2.3–15.9)
RBC.: 2.49 MIL/uL — ABNORMAL LOW (ref 3.87–5.11)
Retic Count, Absolute: 15.7 K/uL — ABNORMAL LOW (ref 19.0–186.0)
Retic Ct Pct: 0.6 % (ref 0.4–3.1)
Reticulocyte Hemoglobin: 33.9 pg

## 2024-02-03 LAB — CBC (CANCER CENTER ONLY)
HCT: 24.1 % — ABNORMAL LOW (ref 36.0–46.0)
Hemoglobin: 7.8 g/dL — ABNORMAL LOW (ref 12.0–15.0)
MCH: 32 pg (ref 26.0–34.0)
MCHC: 32.4 g/dL (ref 30.0–36.0)
MCV: 98.8 fL (ref 80.0–100.0)
Platelet Count: 197 K/uL (ref 150–400)
RBC: 2.44 MIL/uL — ABNORMAL LOW (ref 3.87–5.11)
RDW: 16.3 % — ABNORMAL HIGH (ref 11.5–15.5)
WBC Count: 5.4 K/uL (ref 4.0–10.5)
nRBC: 0 % (ref 0.0–0.2)

## 2024-02-03 LAB — IRON AND TIBC
Iron: 93 ug/dL (ref 28–170)
Saturation Ratios: 36 % — ABNORMAL HIGH (ref 10.4–31.8)
TIBC: 258 ug/dL (ref 250–450)
UIBC: 165 ug/dL

## 2024-02-03 LAB — FERRITIN: Ferritin: 471 ng/mL — ABNORMAL HIGH (ref 11–307)

## 2024-02-03 MED ORDER — EPOETIN ALFA-EPBX 40000 UNIT/ML IJ SOLN
40000.0000 [IU] | Freq: Once | INTRAMUSCULAR | Status: AC
  Administered 2024-02-03: 40000 [IU] via SUBCUTANEOUS
  Filled 2024-02-03: qty 1

## 2024-02-07 ENCOUNTER — Ambulatory Visit

## 2024-02-07 ENCOUNTER — Inpatient Hospital Stay: Admitting: Nurse Practitioner

## 2024-02-07 ENCOUNTER — Inpatient Hospital Stay

## 2024-02-07 ENCOUNTER — Ambulatory Visit: Admitting: Oncology

## 2024-02-07 ENCOUNTER — Other Ambulatory Visit

## 2024-02-14 ENCOUNTER — Other Ambulatory Visit: Payer: Self-pay

## 2024-02-14 DIAGNOSIS — D631 Anemia in chronic kidney disease: Secondary | ICD-10-CM

## 2024-02-17 ENCOUNTER — Encounter: Payer: Self-pay | Admitting: Oncology

## 2024-02-17 ENCOUNTER — Inpatient Hospital Stay: Admitting: Oncology

## 2024-02-17 ENCOUNTER — Inpatient Hospital Stay: Attending: Oncology

## 2024-02-17 ENCOUNTER — Inpatient Hospital Stay

## 2024-02-17 VITALS — BP 160/85 | HR 57 | Temp 98.1°F | Resp 18 | Wt 124.3 lb

## 2024-02-17 DIAGNOSIS — N186 End stage renal disease: Secondary | ICD-10-CM | POA: Diagnosis not present

## 2024-02-17 DIAGNOSIS — I120 Hypertensive chronic kidney disease with stage 5 chronic kidney disease or end stage renal disease: Secondary | ICD-10-CM | POA: Insufficient documentation

## 2024-02-17 DIAGNOSIS — D631 Anemia in chronic kidney disease: Secondary | ICD-10-CM

## 2024-02-17 DIAGNOSIS — I10 Essential (primary) hypertension: Secondary | ICD-10-CM

## 2024-02-17 LAB — CBC (CANCER CENTER ONLY)
HCT: 28.6 % — ABNORMAL LOW (ref 36.0–46.0)
Hemoglobin: 9 g/dL — ABNORMAL LOW (ref 12.0–15.0)
MCH: 32 pg (ref 26.0–34.0)
MCHC: 31.5 g/dL (ref 30.0–36.0)
MCV: 101.8 fL — ABNORMAL HIGH (ref 80.0–100.0)
Platelet Count: 215 K/uL (ref 150–400)
RBC: 2.81 MIL/uL — ABNORMAL LOW (ref 3.87–5.11)
RDW: 16 % — ABNORMAL HIGH (ref 11.5–15.5)
WBC Count: 5.6 K/uL (ref 4.0–10.5)
nRBC: 0 % (ref 0.0–0.2)

## 2024-02-17 LAB — RETIC PANEL
Immature Retic Fract: 4 % (ref 2.3–15.9)
RBC.: 2.85 MIL/uL — ABNORMAL LOW (ref 3.87–5.11)
Retic Count, Absolute: 22.8 K/uL (ref 19.0–186.0)
Retic Ct Pct: 0.8 % (ref 0.4–3.1)
Reticulocyte Hemoglobin: 34.1 pg

## 2024-02-17 LAB — IRON AND TIBC
Iron: 91 ug/dL (ref 28–170)
Saturation Ratios: 35 % — ABNORMAL HIGH (ref 10.4–31.8)
TIBC: 258 ug/dL (ref 250–450)
UIBC: 167 ug/dL

## 2024-02-17 LAB — FERRITIN: Ferritin: 507 ng/mL — ABNORMAL HIGH (ref 11–307)

## 2024-02-17 MED ORDER — EPOETIN ALFA-EPBX 40000 UNIT/ML IJ SOLN
40000.0000 [IU] | Freq: Once | INTRAMUSCULAR | Status: AC
  Administered 2024-02-17: 40000 [IU] via SUBCUTANEOUS
  Filled 2024-02-17: qty 1

## 2024-02-17 NOTE — Assessment & Plan Note (Signed)
 Labs are reviewed and discussed with patient. Lab Results  Component Value Date   HGB 9.0 (L) 02/17/2024   TIBC 258 02/03/2024   IRONPCTSAT 36 (H) 02/03/2024   FERRITIN 471 (H) 02/03/2024    -She tolerated retacrit  40,000 units with premedication-benadryl  very well.  Hemoglobin remains low.  Recommend Retacrit  40,000 units every 2 weeks if H&H <=10.  She get IV iron treatments with her HD and ferritin is at goal.

## 2024-02-17 NOTE — Assessment & Plan Note (Signed)
 Recommend patient to monitor BP closely follow-up with primary care provider and nephrology. BP slightly improved.

## 2024-02-17 NOTE — Progress Notes (Signed)
 " Hematology/Oncology Progress note [phone] [phone]           REFERRING PROVIDER: Ricard Tawni KIDD, MD   CHIEF COMPLAINTS/REASON FOR VISIT:  Anemia due to ESRD   ASSESSMENT & PLAN:   Anemia in ESRD (end-stage renal disease) (HCC) Labs are reviewed and discussed with patient. Lab Results  Component Value Date   HGB 9.0 (L) 02/17/2024   TIBC 258 02/03/2024   IRONPCTSAT 36 (H) 02/03/2024   FERRITIN 471 (H) 02/03/2024    -She tolerated retacrit  40,000 units with premedication-benadryl  very well.  Hemoglobin remains low.  Recommend Retacrit  40,000 units every 2 weeks if H&H <=10.  She get IV iron treatments with her HD and ferritin is at goal.   Uncontrolled hypertension Recommend patient to monitor BP closely follow-up with primary care provider and nephrology. BP slightly improved.   Orders Placed This Encounter  Procedures   CBC (Cancer Center Only)    Standing Status:   Future    Expected Date:   05/11/2024    Expiration Date:   08/09/2024   Iron and TIBC    Standing Status:   Future    Expected Date:   05/11/2024    Expiration Date:   08/09/2024   Ferritin    Standing Status:   Future    Expected Date:   05/11/2024    Expiration Date:   08/09/2024   Retic Panel    Standing Status:   Future    Expected Date:   05/11/2024    Expiration Date:   08/09/2024   Hemoglobin and Hematocrit (Cancer Center Only)    Standing Status:   Standing    Number of Occurrences:   5    Expiration Date:   02/16/2025   Follow up  Lab H&H in 2 weeks, 4 weeks, 6 weeks 8 weeks, 10 weeks +/- Retacrit   12 weeks lab MD +/- Retacrit .  Cbc[no diff] iron tibc ferritin retic panel.   All questions were answered. The patient knows to call the clinic with any problems, questions or concerns.  Jessica Cap, MD, PhD [facility] Hematology Oncology 02/17/2024   HISTORY OF PRESENTING ILLNESS:   Jessica Patterson is a  64 y.o.  female with PMH listed below was seen in consultation at  the request of  Ricard Tawni KIDD, MD  for evaluation of anemia due to ESRD She started hemodialysis on February 09, 2023, attending sessions twice a week. She has received EPO replacement with Mircera and was noted to have adverse reaction. Per patient, the reactions include difficulty breathing and a sensation of 'everything closing up,' occurring both in the hospital and at the [facility] dialysis center. These episodes last five to eight minutes and are relieved with oxygen, but no medications like epinephrine  or steroids were administered.  Recent hospitalization due to worsening kidney function, uncontrolled HTN, and electrolyte imbalance. Was put in the ICU. Started dialysis while in the hospital (x3), and now on schedule of Tuesday/Saturday   She has a long-standing history of medical issues, including being born with exstrophy of bladder, requiring multiple surgeries. She has been under psychiatric care since a young age due to these medical challenges and is currently on Buspar  and Atarax  for anxiety, which helps her sleep.    INTERVAL HISTORY Jessica Patterson is a 64 y.o. female who has above history reviewed by me today presents for follow up visit for anemia in ESRD.  Denies headache, vision changes, nausea vomiting.  Chronic fatigue  unchanged.   MEDICAL HISTORY:  Past Medical History:  Diagnosis Date   Anemia    ESRD   Anxiety    Cervical dysplasia    CKD (chronic kidney disease), stage IV (HCC)    Depression    Exstrophy of bladder    GERD (gastroesophageal reflux disease)    History of kidney stones    Hyperlipidemia    Hypertension    Kidney stone    Pneumonia due to COVID-19 virus    PONV (postoperative nausea and vomiting)    Recurrent UTI    Seasonal allergies    Sepsis (HCC)     SURGICAL HISTORY: Past Surgical History:  Procedure Laterality Date   ABDOMINAL HERNIA REPAIR N/A 07/12/2017   ABDOMINAL HYSTERECTOMY N/A 12/23/2015   Boyce-Vest procedure     age 7    CERVICAL CONE BIOPSY  05/26/2013   COLOSTOMY N/A    COLPOSCOPY  07/19/2015   DIAGNOSTIC LAPAROSCOPY  09/18/2017   abdominal wall mesh excision   DIALYSIS/PERMA CATHETER INSERTION     DIALYSIS/PERMA CATHETER REMOVAL N/A 09/05/2023   Procedure: DIALYSIS/PERMA CATHETER REMOVAL;  Surgeon: Marea Selinda RAMAN, MD;  Location: [facility] INVASIVE CV LAB;  Service: Cardiovascular;  Laterality: N/A;   INSERTION OF ARTERIOVENOUS (AV) ARTEGRAFT ARM Left 05/31/2023   Procedure: INSERTION, GRAFT, ARTERIOVENOUS, UPPER EXTREMITY;  Surgeon: Jama Cordella MATSU, MD;  Location: [facility] ORS;  Service: Vascular;  Laterality: Left;  BRACHIAL AXILLARY   KIDNEY STONE SURGERY     NEPHROSTOMY TUBE PLACEMENT ([facility] HX) N/A    SMALL INTESTINE SURGERY     x3   URETEROSIGMOIDOSTOMY      SOCIAL HISTORY: Social History   Socioeconomic History   Marital status: Single    Spouse name: Not on file   Number of children: Not on file   Years of education: Not on file   Highest education level: Not on file  Occupational History   Not on file  Tobacco Use   Smoking status: Never   Smokeless tobacco: Never  Vaping Use   Vaping status: Never Used  Substance and Sexual Activity   Alcohol use: Never   Drug use: Never   Sexual activity: Not on file  Other Topics Concern   Not on file  Social History Narrative   Not on file   Social Drivers of Health   Tobacco Use: Low Risk (02/17/2024)   Patient History    Smoking Tobacco Use: Never    Smokeless Tobacco Use: Never    Passive Exposure: Not on file  Financial Resource Strain: Not on file  Food Insecurity: No Food Insecurity (11/20/2023)   Epic    Worried About Programme Researcher, Broadcasting/film/video in the Last Year: Never true    Ran Out of Food in the Last Year: Never true  Transportation Needs: No Transportation Needs (11/20/2023)   Epic    Lack of Transportation (Medical): No    Lack of Transportation (Non-Medical): No  Physical Activity: Not on file  Stress: Not on file  Social  Connections: Not on file  Intimate Partner Violence: Not At Risk (11/20/2023)   Epic    Fear of Current or Ex-Partner: No    Emotionally Abused: No    Physically Abused: No    Sexually Abused: No  Depression (PHQ2-9): Not on file  Alcohol Screen: Not on file  Housing: Low Risk (11/20/2023)   Epic    Unable to Pay for Housing in the Last Year: No    Number of Times  Moved in the Last Year: 0    Homeless in the Last Year: No  Utilities: Not At Risk (11/20/2023)   Epic    Threatened with loss of utilities: No  Health Literacy: Not on file    FAMILY HISTORY: Family History  Problem Relation Age of Onset   Lung cancer Mother    Stroke Father    Breast cancer Neg Hx     ALLERGIES:  is allergic to amlodipine, cephalosporins, codeine, hydrochlorothiazide, hydromorphone, latex, losartan  potassium, metoprolol, doxycycline, lisinopril, sucralfate, cholecalciferol, ciprofloxacin, clonidine hcl, diltiazem, doxazosin mesylate, hydralazine , hydralazine  hcl, iodine-131, ivp dye [iodinated contrast media], methyldopa, mircera [methoxy polyethylene glycol-epoetin  beta], nifedipine, ranitidine hcl, sulfa antibiotics, cefuroxime axetil, diltiazem hcl, isosorbide , morphine, penicillin g, and prazosin.  MEDICATIONS:  Current Outpatient Medications  Medication Sig Dispense Refill   acetaminophen  (TYLENOL ) 325 MG tablet Take 650 mg by mouth every 6 (six) hours as needed for moderate pain (pain score 4-6).     atorvastatin (LIPITOR) 40 MG tablet Take 40 mg by mouth.     bisoprolol  (ZEBETA ) 5 MG tablet Take 2 tablets (10 mg total) by mouth daily. 60 tablet 11   busPIRone  (BUSPAR ) 5 MG tablet Take 5 mg by mouth 2 (two) times daily.     cetirizine  (ZYRTEC ) 10 MG tablet Take 10 mg by mouth daily as needed.     cyanocobalamin  1000 MCG tablet Take 1 tablet (1,000 mcg total) by mouth daily. 90 tablet 0   cyclobenzaprine  (FLEXERIL ) 10 MG tablet Take 10 mg by mouth at bedtime as needed for muscle spasms.      diphenhydrAMINE  (BENADRYL ) 25 mg capsule Take 25 mg by mouth every 30 (thirty) days. Take before monthly Retacrit  injection.     diphenhydramine -acetaminophen  (TYLENOL  PM) 25-500 MG TABS tablet Take 1 tablet by mouth at bedtime as needed.     ENTRESTO 97-103 MG Take 1 tablet by mouth 2 (two) times daily.     Epoetin  Alfa-epbx (RETACRIT  IJ) Inject as directed. Monthly injection at cancer center     fluticasone  (FLONASE ) 50 MCG/ACT nasal spray Place 2 sprays into both nostrils daily as needed for allergies or rhinitis.     folic acid  (FOLVITE ) 1 MG tablet Take 1 tablet (1 mg total) by mouth daily. 90 tablet 0   furosemide  (LASIX ) 80 MG tablet Take 80 mg by mouth as directed. TAKE 1 TABLET BY MOUTH ONCE DAILY AND EXTRA DOSE AS NEEDED FOR SWELLING OR SHORTNESS OF BREATH     hydrOXYzine  (ATARAX ) 25 MG tablet Take 25 mg by mouth every 6 (six) hours as needed for anxiety.     Magnesium  Oxide -Mg Supplement 250 MG TABS Take 1 tablet by mouth daily.     montelukast  (SINGULAIR ) 10 MG tablet Take 10 mg by mouth daily as needed (allergies).     multivitamin (RENA-VIT) TABS tablet Take 1 tablet by mouth daily.     pantoprazole  (PROTONIX ) 40 MG tablet Take 1 tablet (40 mg total) by mouth daily for 14 days. 14 tablet 0   Omega-3 Fatty Acids (FISH OIL PO) Take 1 capsule by mouth as directed. Take 1 capsule 3 times a week. (Patient not taking: Reported on 02/17/2024)     ondansetron  (ZOFRAN -ODT) 4 MG disintegrating tablet Take 4 mg by mouth every 8 (eight) hours as needed. (Patient not taking: Reported on 02/17/2024)     oxyCODONE -acetaminophen  (PERCOCET) 5-325 MG tablet Take 1 tablet by mouth every 6 (six) hours as needed for severe pain (pain score 7-10) or moderate  pain (pain score 4-6). (Patient not taking: Reported on 02/17/2024) 30 tablet 0   sodium bicarbonate  650 MG tablet Take 650 mg by mouth 2 (two) times a week. Tuesday and Saturday (Patient not taking: Reported on 02/17/2024)     No current  facility-administered medications for this visit.   Facility-Administered Medications Ordered in Other Visits  Medication Dose Route Frequency Provider Last Rate Last Admin   lidocaine -EPINEPHrine  (PF) (XYLOCAINE -EPINEPHrine ) 1 %-1:200000 (PF) injection    PRN Dew, Jason S, MD   20 mL at 09/05/23 1508    Review of Systems  Constitutional:  Positive for fatigue. Negative for chills and fever.  HENT:   Negative for hearing loss and voice change.   Eyes:  Negative for eye problems.  Respiratory:  Negative for chest tightness and cough.   Cardiovascular:  Negative for chest pain.  Gastrointestinal:  Negative for abdominal distention, abdominal pain and blood in stool.  Endocrine: Negative for hot flashes.  Genitourinary:  Negative for difficulty urinating and frequency.   Musculoskeletal:  Negative for arthralgias.  Skin:  Negative for itching and rash.  Neurological:  Negative for extremity weakness.  Hematological:  Negative for adenopathy.  Psychiatric/Behavioral:  Negative for confusion.    PHYSICAL EXAMINATION: ECOG PERFORMANCE STATUS: 1 - Symptomatic but completely ambulatory Vitals:   02/17/24 1510 02/17/24 1534  BP: (!) 180/90 (!) 160/85  Pulse: (!) 57   Resp: 18   Temp: 98.1 F (36.7 C)   SpO2: 99%    Filed Weights   02/17/24 1510  Weight: 124 lb 4.8 oz (56.4 kg)    Physical Exam Constitutional:      General: She is not in acute distress. HENT:     Head: Normocephalic and atraumatic.  Eyes:     General: No scleral icterus. Cardiovascular:     Rate and Rhythm: Normal rate and regular rhythm.     Heart sounds: Normal heart sounds.  Pulmonary:     Effort: Pulmonary effort is normal. No respiratory distress.     Breath sounds: No wheezing.  Abdominal:     General: Bowel sounds are normal. There is no distension.     Palpations: Abdomen is soft.  Musculoskeletal:        General: No deformity. Normal range of motion.     Cervical back: Normal range of motion and  neck supple.  Skin:    General: Skin is warm and dry.     Findings: No erythema or rash.  Neurological:     Mental Status: She is alert and oriented to person, place, and time. Mental status is at baseline.  Psychiatric:        Mood and Affect: Mood normal.     LABORATORY DATA:  I have reviewed the data as listed    Latest Ref Rng & Units 02/17/2024    2:52 PM 02/03/2024    2:50 PM 01/17/2024    2:22 PM  CBC  WBC 4.0 - 10.5 K/uL 5.6  5.4    Hemoglobin 12.0 - 15.0 g/dL 9.0  7.8  7.9   Hematocrit 36.0 - 46.0 % 28.6  24.1  24.9   Platelets 150 - 400 K/uL 215  197        Latest Ref Rng & Units 11/20/2023    5:08 AM 11/19/2023    5:16 AM 11/18/2023    5:13 AM  CMP  Glucose 70 - 99 mg/dL 97  98  97   BUN 8 - 23 mg/dL 30  32  52   Creatinine 0.44 - 1.00 mg/dL 4.39  4.18  1.41   Sodium 135 - 145 mmol/L 135  136  140   Potassium 3.5 - 5.1 mmol/L 3.8  4.6  5.6   Chloride 98 - 111 mmol/L 97  98  103   CO2 22 - 32 mmol/L 26  25  19    Calcium 8.9 - 10.3 mg/dL 8.7  8.7  8.1       RADIOGRAPHIC STUDIES: I have personally reviewed the radiological images as listed and agreed with the findings in the report. MM 3D SCREENING MAMMOGRAM BILATERAL BREAST Result Date: 12/10/2023 CLINICAL DATA:  Screening. EXAM: DIGITAL SCREENING BILATERAL MAMMOGRAM WITH TOMOSYNTHESIS AND CAD TECHNIQUE: Bilateral screening digital craniocaudal and mediolateral oblique mammograms were obtained. Bilateral screening digital breast tomosynthesis was performed. The images were evaluated with computer-aided detection. COMPARISON:  Previous exam(s). ACR Breast Density Category c: The breasts are heterogeneously dense, which may obscure small masses. FINDINGS: There are no findings suspicious for malignancy. IMPRESSION: No mammographic evidence of malignancy. A result letter of this screening mammogram will be mailed directly to the patient. RECOMMENDATION: Screening mammogram in one year. (Code:SM-B-01Y) BI-RADS  CATEGORY  1: Negative. Electronically Signed   By: Inocente Ast M.D.   On: 12/10/2023 09:49         "

## 2024-02-24 ENCOUNTER — Encounter: Payer: Self-pay | Admitting: Oncology

## 2024-02-24 ENCOUNTER — Other Ambulatory Visit: Payer: Self-pay

## 2024-03-02 ENCOUNTER — Inpatient Hospital Stay

## 2024-03-04 ENCOUNTER — Inpatient Hospital Stay

## 2024-03-04 VITALS — BP 140/80

## 2024-03-04 DIAGNOSIS — I120 Hypertensive chronic kidney disease with stage 5 chronic kidney disease or end stage renal disease: Secondary | ICD-10-CM | POA: Diagnosis not present

## 2024-03-04 DIAGNOSIS — D631 Anemia in chronic kidney disease: Secondary | ICD-10-CM

## 2024-03-04 LAB — HEMOGLOBIN AND HEMATOCRIT (CANCER CENTER ONLY)
HCT: 27.9 % — ABNORMAL LOW (ref 36.0–46.0)
Hemoglobin: 9.1 g/dL — ABNORMAL LOW (ref 12.0–15.0)

## 2024-03-04 MED ORDER — EPOETIN ALFA-EPBX 40000 UNIT/ML IJ SOLN
40000.0000 [IU] | Freq: Once | INTRAMUSCULAR | Status: AC
  Administered 2024-03-04: 40000 [IU] via SUBCUTANEOUS
  Filled 2024-03-04: qty 1

## 2024-03-16 ENCOUNTER — Inpatient Hospital Stay: Attending: Oncology

## 2024-03-16 ENCOUNTER — Inpatient Hospital Stay

## 2024-03-25 ENCOUNTER — Encounter (INDEPENDENT_AMBULATORY_CARE_PROVIDER_SITE_OTHER)

## 2024-03-25 ENCOUNTER — Ambulatory Visit (INDEPENDENT_AMBULATORY_CARE_PROVIDER_SITE_OTHER): Admitting: Nurse Practitioner

## 2024-03-30 ENCOUNTER — Inpatient Hospital Stay

## 2024-04-13 ENCOUNTER — Inpatient Hospital Stay

## 2024-04-27 ENCOUNTER — Inpatient Hospital Stay

## 2024-05-12 ENCOUNTER — Inpatient Hospital Stay: Admitting: Oncology

## 2024-05-12 ENCOUNTER — Inpatient Hospital Stay
